# Patient Record
Sex: Female | Born: 1960 | Race: White | Hispanic: No | Smoking: Former smoker
Health system: Southern US, Community
[De-identification: ages and names within clinical notes are randomized; demographics above are authoritative.]

## PROBLEM LIST (undated history)

## (undated) DIAGNOSIS — J449 Chronic obstructive pulmonary disease, unspecified: Secondary | ICD-10-CM

## (undated) DIAGNOSIS — I219 Acute myocardial infarction, unspecified: Secondary | ICD-10-CM

## (undated) DIAGNOSIS — F32A Depression, unspecified: Secondary | ICD-10-CM

## (undated) DIAGNOSIS — I10 Essential (primary) hypertension: Secondary | ICD-10-CM

## (undated) DIAGNOSIS — I2510 Atherosclerotic heart disease of native coronary artery without angina pectoris: Secondary | ICD-10-CM

## (undated) DIAGNOSIS — G4730 Sleep apnea, unspecified: Secondary | ICD-10-CM

## (undated) DIAGNOSIS — K219 Gastro-esophageal reflux disease without esophagitis: Secondary | ICD-10-CM

## (undated) DIAGNOSIS — F329 Major depressive disorder, single episode, unspecified: Secondary | ICD-10-CM

## (undated) DIAGNOSIS — F419 Anxiety disorder, unspecified: Secondary | ICD-10-CM

## (undated) DIAGNOSIS — J40 Bronchitis, not specified as acute or chronic: Secondary | ICD-10-CM

## (undated) DIAGNOSIS — Z9889 Other specified postprocedural states: Secondary | ICD-10-CM

## (undated) DIAGNOSIS — B3781 Candidal esophagitis: Secondary | ICD-10-CM

## (undated) DIAGNOSIS — B977 Papillomavirus as the cause of diseases classified elsewhere: Secondary | ICD-10-CM

## (undated) DIAGNOSIS — N189 Chronic kidney disease, unspecified: Secondary | ICD-10-CM

## (undated) HISTORY — PX: BREAST BIOPSY: SHX20

## (undated) HISTORY — DX: Chronic kidney disease, unspecified: N18.9

## (undated) HISTORY — DX: Other specified postprocedural states: Z98.890

## (undated) HISTORY — PX: HEMORRHOID SURGERY: SHX153

## (undated) HISTORY — PX: CHOLECYSTECTOMY: SHX55

## (undated) HISTORY — DX: Sleep apnea, unspecified: G47.30

## (undated) HISTORY — PX: OTHER SURGICAL HISTORY: SHX169

## (undated) HISTORY — DX: Papillomavirus as the cause of diseases classified elsewhere: B97.7

## (undated) HISTORY — PX: TUBAL LIGATION: SHX77

## (undated) HISTORY — PX: HERNIA REPAIR: SHX51

## (undated) HISTORY — PX: CAROTID STENT: SHX1301

---

## 1988-04-21 HISTORY — PX: TUBAL LIGATION: SHX77

## 1988-04-21 HISTORY — PX: HEMORRHOID SURGERY: SHX153

## 2003-06-13 ENCOUNTER — Emergency Department (HOSPITAL_COMMUNITY): Admission: EM | Admit: 2003-06-13 | Discharge: 2003-06-14 | Payer: Self-pay | Admitting: *Deleted

## 2004-02-14 ENCOUNTER — Ambulatory Visit: Payer: Self-pay

## 2004-04-21 HISTORY — PX: CHOLECYSTECTOMY: SHX55

## 2004-06-04 ENCOUNTER — Other Ambulatory Visit: Payer: Self-pay

## 2004-06-04 ENCOUNTER — Emergency Department: Payer: Self-pay | Admitting: Emergency Medicine

## 2004-06-24 ENCOUNTER — Ambulatory Visit: Payer: Self-pay

## 2004-07-04 ENCOUNTER — Ambulatory Visit: Payer: Self-pay | Admitting: Nurse Practitioner

## 2006-05-06 ENCOUNTER — Inpatient Hospital Stay: Payer: Self-pay | Admitting: Psychiatry

## 2006-05-06 ENCOUNTER — Other Ambulatory Visit: Payer: Self-pay

## 2006-10-05 ENCOUNTER — Emergency Department: Payer: Self-pay

## 2008-02-29 ENCOUNTER — Emergency Department (HOSPITAL_COMMUNITY): Admission: EM | Admit: 2008-02-29 | Discharge: 2008-02-29 | Payer: Self-pay | Admitting: Emergency Medicine

## 2008-03-01 ENCOUNTER — Emergency Department (HOSPITAL_COMMUNITY): Admission: EM | Admit: 2008-03-01 | Discharge: 2008-03-01 | Payer: Self-pay | Admitting: Emergency Medicine

## 2008-06-21 ENCOUNTER — Emergency Department (HOSPITAL_COMMUNITY): Admission: EM | Admit: 2008-06-21 | Discharge: 2008-06-21 | Payer: Self-pay | Admitting: Emergency Medicine

## 2009-07-08 ENCOUNTER — Observation Stay (HOSPITAL_COMMUNITY): Admission: EM | Admit: 2009-07-08 | Discharge: 2009-07-09 | Payer: Self-pay | Admitting: Emergency Medicine

## 2009-07-09 ENCOUNTER — Ambulatory Visit: Payer: Self-pay | Admitting: Vascular Surgery

## 2009-07-09 ENCOUNTER — Encounter (INDEPENDENT_AMBULATORY_CARE_PROVIDER_SITE_OTHER): Payer: Self-pay | Admitting: Internal Medicine

## 2009-11-06 ENCOUNTER — Emergency Department (HOSPITAL_COMMUNITY): Admission: EM | Admit: 2009-11-06 | Discharge: 2009-11-06 | Payer: Self-pay | Admitting: Emergency Medicine

## 2010-01-22 DIAGNOSIS — I10 Essential (primary) hypertension: Secondary | ICD-10-CM | POA: Insufficient documentation

## 2010-02-06 ENCOUNTER — Encounter: Payer: Self-pay | Admitting: Pulmonary Disease

## 2010-02-06 DIAGNOSIS — R0602 Shortness of breath: Secondary | ICD-10-CM | POA: Insufficient documentation

## 2010-02-07 ENCOUNTER — Telehealth: Payer: Self-pay | Admitting: Pulmonary Disease

## 2010-05-21 NOTE — Miscellaneous (Signed)
Summary: Orders Update  Clinical Lists Changes  Problems: Added new problem of SHORTNESS OF BREATH (ICD-786.05) Orders: Added new Service order of No Show NS50 (NS50) - Signed

## 2010-05-21 NOTE — Progress Notes (Signed)
Summary: nos appt  Phone Note Call from Patient   Caller: [email or web address]  Call For: clance Summary of Call: LMTCB x2 to rsc nos from 10/19. Initial call taken by: Darletta Moll,  February 07, 2010 3:32 PM

## 2010-06-02 ENCOUNTER — Emergency Department (HOSPITAL_COMMUNITY): Payer: BC Managed Care – PPO

## 2010-06-02 ENCOUNTER — Emergency Department (HOSPITAL_COMMUNITY)
Admission: EM | Admit: 2010-06-02 | Discharge: 2010-06-02 | Disposition: A | Discharge status: Home / Self Care | Payer: BC Managed Care – PPO | Attending: Emergency Medicine | Admitting: Emergency Medicine

## 2010-06-02 DIAGNOSIS — R079 Chest pain, unspecified: Secondary | ICD-10-CM | POA: Insufficient documentation

## 2010-06-02 DIAGNOSIS — R0602 Shortness of breath: Secondary | ICD-10-CM | POA: Insufficient documentation

## 2010-06-02 DIAGNOSIS — J069 Acute upper respiratory infection, unspecified: Secondary | ICD-10-CM | POA: Insufficient documentation

## 2010-06-02 DIAGNOSIS — R062 Wheezing: Secondary | ICD-10-CM | POA: Insufficient documentation

## 2010-06-02 DIAGNOSIS — J40 Bronchitis, not specified as acute or chronic: Secondary | ICD-10-CM | POA: Insufficient documentation

## 2010-06-02 DIAGNOSIS — I10 Essential (primary) hypertension: Secondary | ICD-10-CM | POA: Insufficient documentation

## 2010-06-02 DIAGNOSIS — R42 Dizziness and giddiness: Secondary | ICD-10-CM | POA: Insufficient documentation

## 2010-06-02 LAB — BASIC METABOLIC PANEL
BUN: 12 mg/dL (ref 6–23)
CO2: 23 mEq/L (ref 19–32)
Chloride: 101 mEq/L (ref 96–112)
Creatinine, Ser: 1.31 mg/dL — ABNORMAL HIGH (ref 0.4–1.2)
GFR calc Af Amer: 52 mL/min — ABNORMAL LOW (ref >60)
Glucose, Bld: 99 mg/dL (ref 70–99)
Potassium: 3.6 mEq/L (ref 3.5–5.1)
Sodium: 136 mEq/L (ref 135–145)

## 2010-06-02 LAB — POCT CARDIAC MARKERS
CKMB, poc: <1 ng/mL — ABNORMAL LOW (ref 1.0–8.0)
Myoglobin, poc: 50.3 ng/mL (ref 12–200)
Troponin i, poc: <0.05 ng/mL (ref 0.00–0.09)
Troponin i, poc: <0.05 ng/mL (ref 0.00–0.09)

## 2010-06-02 LAB — CBC
HCT: 39.2 % (ref 36.0–46.0)
Hemoglobin: 13.6 g/dL (ref 12.0–15.0)
MCH: 31.6 pg (ref 26.0–34.0)
MCHC: 34.7 g/dL (ref 30.0–36.0)
Platelets: 243 10*3/uL (ref 150–400)

## 2010-06-02 LAB — DIFFERENTIAL
Lymphocytes Relative: 21 % (ref 12–46)
Neutro Abs: 4.5 10*3/uL (ref 1.7–7.7)
Neutrophils Relative %: 61 % (ref 43–77)

## 2010-07-06 LAB — CBC
MCHC: 33.9 g/dL (ref 30.0–36.0)
RDW: 14.1 % (ref 11.5–15.5)

## 2010-07-06 LAB — URINALYSIS, ROUTINE W REFLEX MICROSCOPIC
Bilirubin Urine: NEGATIVE
Ketones, ur: NEGATIVE mg/dL
Nitrite: NEGATIVE
Protein, ur: NEGATIVE mg/dL
Specific Gravity, Urine: 1.01 (ref 1.005–1.030)
Urobilinogen, UA: 0.2 mg/dL (ref 0.0–1.0)

## 2010-07-06 LAB — POCT CARDIAC MARKERS
CKMB, poc: 1.1 ng/mL (ref 1.0–8.0)
Myoglobin, poc: 68.4 ng/mL (ref 12–200)

## 2010-07-06 LAB — BASIC METABOLIC PANEL
BUN: 13 mg/dL (ref 6–23)
Calcium: 9.5 mg/dL (ref 8.4–10.5)
Creatinine, Ser: 1.21 mg/dL — ABNORMAL HIGH (ref 0.4–1.2)
GFR calc non Af Amer: 47 mL/min — ABNORMAL LOW (ref >60)
Glucose, Bld: 110 mg/dL — ABNORMAL HIGH (ref 70–99)

## 2010-07-06 LAB — PREGNANCY, URINE: Preg Test, Ur: NEGATIVE

## 2010-07-06 LAB — DIFFERENTIAL
Basophils Absolute: 0.1 10*3/uL (ref 0.0–0.1)
Basophils Relative: 1 % (ref 0–1)
Monocytes Absolute: 0.3 10*3/uL (ref 0.1–1.0)
Neutro Abs: 10 10*3/uL — ABNORMAL HIGH (ref 1.7–7.7)
Neutrophils Relative %: 84 % — ABNORMAL HIGH (ref 43–77)

## 2010-07-15 LAB — DIFFERENTIAL
Basophils Absolute: 0.1 10*3/uL (ref 0.0–0.1)
Basophils Relative: 1 % (ref 0–1)
Eosinophils Absolute: 0.4 10*3/uL (ref 0.0–0.7)
Eosinophils Relative: 4 % (ref 0–5)
Monocytes Absolute: 0.6 10*3/uL (ref 0.1–1.0)
Monocytes Relative: 6 % (ref 3–12)
Neutro Abs: 6.6 10*3/uL (ref 1.7–7.7)

## 2010-07-15 LAB — BLOOD GAS, ARTERIAL
Bicarbonate: 17.3 mEq/L — ABNORMAL LOW (ref 20.0–24.0)
Drawn by: 270091
O2 Content: 2 L/min
pCO2 arterial: 20.1 mmHg — ABNORMAL LOW (ref 35.0–45.0)
pO2, Arterial: 129 mmHg — ABNORMAL HIGH (ref 80.0–100.0)

## 2010-07-15 LAB — GLUCOSE, CAPILLARY
Glucose-Capillary: 107 mg/dL — ABNORMAL HIGH (ref 70–99)
Glucose-Capillary: 144 mg/dL — ABNORMAL HIGH (ref 70–99)

## 2010-07-15 LAB — LIPASE, BLOOD
Lipase: 25 U/L (ref 11–59)
Lipase: 62 U/L — ABNORMAL HIGH (ref 11–59)

## 2010-07-15 LAB — CBC
HCT: 38 % (ref 36.0–46.0)
HCT: 40 % (ref 36.0–46.0)
Hemoglobin: 12.5 g/dL (ref 12.0–15.0)
Hemoglobin: 14 g/dL (ref 12.0–15.0)
MCHC: 34.9 g/dL (ref 30.0–36.0)
MCV: 98.8 fL (ref 78.0–100.0)
MCV: 98.9 fL (ref 78.0–100.0)
Platelets: 268 10*3/uL (ref 150–400)
RBC: 3.62 MIL/uL — ABNORMAL LOW (ref 3.87–5.11)
RDW: 14.6 % (ref 11.5–15.5)
RDW: 14.7 % (ref 11.5–15.5)
WBC: 8.2 10*3/uL (ref 4.0–10.5)
WBC: 8.8 10*3/uL (ref 4.0–10.5)

## 2010-07-15 LAB — POCT I-STAT, CHEM 8
Calcium, Ion: 1.09 mmol/L — ABNORMAL LOW (ref 1.12–1.32)
Glucose, Bld: 100 mg/dL — ABNORMAL HIGH (ref 70–99)
HCT: 41 % (ref 36.0–46.0)
Hemoglobin: 13.9 g/dL (ref 12.0–15.0)
TCO2: 23 mmol/L (ref 0–100)

## 2010-07-15 LAB — BASIC METABOLIC PANEL
BUN: 3 mg/dL — ABNORMAL LOW (ref 6–23)
Chloride: 105 mEq/L (ref 96–112)
Creatinine, Ser: 0.93 mg/dL (ref 0.4–1.2)
GFR calc non Af Amer: >60 mL/min (ref >60)
Glucose, Bld: 94 mg/dL (ref 70–99)
Potassium: 4.2 mEq/L (ref 3.5–5.1)

## 2010-07-15 LAB — RAPID URINE DRUG SCREEN, HOSP PERFORMED
Barbiturates: NOT DETECTED
Cocaine: NOT DETECTED
Opiates: POSITIVE — AB
Tetrahydrocannabinol: NOT DETECTED

## 2010-07-15 LAB — COMPREHENSIVE METABOLIC PANEL
ALT: 27 U/L (ref 0–35)
Alkaline Phosphatase: 78 U/L (ref 39–117)
CO2: 27 mEq/L (ref 19–32)
GFR calc non Af Amer: >60 mL/min (ref >60)
Glucose, Bld: 94 mg/dL (ref 70–99)
Potassium: 4.1 mEq/L (ref 3.5–5.1)
Sodium: 142 mEq/L (ref 135–145)

## 2010-07-15 LAB — CK TOTAL AND CKMB (NOT AT ARMC)
Relative Index: INVALID (ref 0.0–2.5)
Relative Index: INVALID (ref 0.0–2.5)
Total CK: 67 U/L (ref 7–177)

## 2010-07-15 LAB — TROPONIN I
Troponin I: 0.01 ng/mL (ref 0.00–0.06)
Troponin I: <0.01 ng/mL (ref 0.00–0.06)

## 2010-07-15 LAB — URINALYSIS, ROUTINE W REFLEX MICROSCOPIC
Bilirubin Urine: NEGATIVE
Glucose, UA: NEGATIVE mg/dL
Specific Gravity, Urine: 1.005 (ref 1.005–1.030)
Urobilinogen, UA: 0.2 mg/dL (ref 0.0–1.0)

## 2010-07-15 LAB — LIPID PANEL
HDL: 45 mg/dL (ref >39)
VLDL: 22 mg/dL (ref 0–40)

## 2010-07-15 LAB — TSH: TSH: 2.863 u[IU]/mL (ref 0.350–4.500)

## 2010-07-15 LAB — URINE MICROSCOPIC-ADD ON

## 2010-07-15 LAB — POCT PREGNANCY, URINE: Preg Test, Ur: NEGATIVE

## 2010-08-01 LAB — COMPREHENSIVE METABOLIC PANEL
ALT: 9 U/L (ref 0–35)
AST: 16 U/L (ref 0–37)
Albumin: 3.4 g/dL — ABNORMAL LOW (ref 3.5–5.2)
Alkaline Phosphatase: 56 U/L (ref 39–117)
BUN: 11 mg/dL (ref 6–23)
Chloride: 105 mEq/L (ref 96–112)
Potassium: 3.1 mEq/L — ABNORMAL LOW (ref 3.5–5.1)
Sodium: 137 mEq/L (ref 135–145)
Total Bilirubin: 0.5 mg/dL (ref 0.3–1.2)

## 2010-08-01 LAB — URINALYSIS, ROUTINE W REFLEX MICROSCOPIC
Bilirubin Urine: NEGATIVE
Glucose, UA: NEGATIVE mg/dL
Hgb urine dipstick: NEGATIVE
Ketones, ur: NEGATIVE mg/dL
Protein, ur: NEGATIVE mg/dL
Urobilinogen, UA: 0.2 mg/dL (ref 0.0–1.0)

## 2010-08-01 LAB — DIFFERENTIAL
Basophils Absolute: 0 10*3/uL (ref 0.0–0.1)
Basophils Relative: 0 % (ref 0–1)
Eosinophils Absolute: 0.2 10*3/uL (ref 0.0–0.7)
Eosinophils Relative: 3 % (ref 0–5)
Monocytes Absolute: 0.4 10*3/uL (ref 0.1–1.0)
Neutro Abs: 5.3 10*3/uL (ref 1.7–7.7)

## 2010-08-01 LAB — RAPID URINE DRUG SCREEN, HOSP PERFORMED
Amphetamines: NOT DETECTED
Benzodiazepines: POSITIVE — AB
Tetrahydrocannabinol: NOT DETECTED

## 2010-08-01 LAB — CBC
HCT: 39.3 % (ref 36.0–46.0)
Platelets: 267 10*3/uL (ref 150–400)
WBC: 8.2 10*3/uL (ref 4.0–10.5)

## 2010-08-01 LAB — ETHANOL: Alcohol, Ethyl (B): <5 mg/dL (ref 0–10)

## 2011-01-18 ENCOUNTER — Inpatient Hospital Stay (HOSPITAL_COMMUNITY)
Admission: EM | Admit: 2011-01-18 | Discharge: 2011-01-23 | DRG: 182 | Disposition: A | Discharge status: Home / Self Care | Payer: BC Managed Care – PPO | Attending: Internal Medicine | Admitting: Internal Medicine

## 2011-01-18 ENCOUNTER — Emergency Department (HOSPITAL_COMMUNITY): Payer: BC Managed Care – PPO

## 2011-01-18 ENCOUNTER — Other Ambulatory Visit: Payer: Self-pay

## 2011-01-18 ENCOUNTER — Encounter: Payer: Self-pay | Admitting: *Deleted

## 2011-01-18 DIAGNOSIS — R0609 Other forms of dyspnea: Secondary | ICD-10-CM | POA: Diagnosis present

## 2011-01-18 DIAGNOSIS — E871 Hypo-osmolality and hyponatremia: Secondary | ICD-10-CM | POA: Diagnosis not present

## 2011-01-18 DIAGNOSIS — K859 Acute pancreatitis without necrosis or infection, unspecified: Secondary | ICD-10-CM | POA: Diagnosis present

## 2011-01-18 DIAGNOSIS — D130 Benign neoplasm of esophagus: Secondary | ICD-10-CM | POA: Diagnosis present

## 2011-01-18 DIAGNOSIS — K2960 Other gastritis without bleeding: Principal | ICD-10-CM | POA: Diagnosis present

## 2011-01-18 DIAGNOSIS — R112 Nausea with vomiting, unspecified: Secondary | ICD-10-CM

## 2011-01-18 DIAGNOSIS — F341 Dysthymic disorder: Secondary | ICD-10-CM | POA: Diagnosis present

## 2011-01-18 DIAGNOSIS — R0600 Dyspnea, unspecified: Secondary | ICD-10-CM

## 2011-01-18 DIAGNOSIS — T380X5A Adverse effect of glucocorticoids and synthetic analogues, initial encounter: Secondary | ICD-10-CM | POA: Diagnosis present

## 2011-01-18 DIAGNOSIS — E873 Alkalosis: Secondary | ICD-10-CM | POA: Diagnosis present

## 2011-01-18 DIAGNOSIS — J449 Chronic obstructive pulmonary disease, unspecified: Secondary | ICD-10-CM | POA: Diagnosis present

## 2011-01-18 DIAGNOSIS — R0602 Shortness of breath: Secondary | ICD-10-CM

## 2011-01-18 DIAGNOSIS — R110 Nausea: Secondary | ICD-10-CM | POA: Diagnosis present

## 2011-01-18 DIAGNOSIS — F329 Major depressive disorder, single episode, unspecified: Secondary | ICD-10-CM | POA: Diagnosis present

## 2011-01-18 DIAGNOSIS — R079 Chest pain, unspecified: Secondary | ICD-10-CM | POA: Diagnosis present

## 2011-01-18 DIAGNOSIS — F419 Anxiety disorder, unspecified: Secondary | ICD-10-CM | POA: Diagnosis present

## 2011-01-18 DIAGNOSIS — F32A Depression, unspecified: Secondary | ICD-10-CM | POA: Diagnosis present

## 2011-01-18 DIAGNOSIS — I10 Essential (primary) hypertension: Secondary | ICD-10-CM

## 2011-01-18 DIAGNOSIS — R1310 Dysphagia, unspecified: Secondary | ICD-10-CM | POA: Diagnosis not present

## 2011-01-18 DIAGNOSIS — R1013 Epigastric pain: Secondary | ICD-10-CM | POA: Diagnosis present

## 2011-01-18 DIAGNOSIS — E876 Hypokalemia: Secondary | ICD-10-CM | POA: Diagnosis not present

## 2011-01-18 DIAGNOSIS — K3189 Other diseases of stomach and duodenum: Secondary | ICD-10-CM | POA: Diagnosis present

## 2011-01-18 DIAGNOSIS — J4489 Other specified chronic obstructive pulmonary disease: Secondary | ICD-10-CM | POA: Diagnosis present

## 2011-01-18 HISTORY — DX: Depression, unspecified: F32.A

## 2011-01-18 HISTORY — DX: Candidal esophagitis: B37.81

## 2011-01-18 HISTORY — DX: Gastro-esophageal reflux disease without esophagitis: K21.9

## 2011-01-18 HISTORY — DX: Chronic obstructive pulmonary disease, unspecified: J44.9

## 2011-01-18 HISTORY — DX: Atherosclerotic heart disease of native coronary artery without angina pectoris: I25.10

## 2011-01-18 HISTORY — DX: Essential (primary) hypertension: I10

## 2011-01-18 HISTORY — DX: Bronchitis, not specified as acute or chronic: J40

## 2011-01-18 HISTORY — DX: Anxiety disorder, unspecified: F41.9

## 2011-01-18 HISTORY — DX: Major depressive disorder, single episode, unspecified: F32.9

## 2011-01-18 LAB — CARDIAC PANEL(CRET KIN+CKTOT+MB+TROPI)
CK, MB: 2.6 ng/mL (ref 0.3–4.0)
Relative Index: INVALID (ref 0.0–2.5)
Total CK: 45 U/L (ref 7–177)
Troponin I: <0.3 ng/mL

## 2011-01-18 LAB — HEPATIC FUNCTION PANEL
ALT: 11 U/L (ref 0–35)
AST: 13 U/L (ref 0–37)
Albumin: 3.9 g/dL (ref 3.5–5.2)
Alkaline Phosphatase: 85 U/L (ref 39–117)
Bilirubin, Direct: 0.1 mg/dL (ref 0.0–0.3)
Indirect Bilirubin: 0.2 mg/dL — ABNORMAL LOW (ref 0.3–0.9)
Total Bilirubin: 0.3 mg/dL (ref 0.3–1.2)
Total Protein: 7.3 g/dL (ref 6.0–8.3)

## 2011-01-18 LAB — BASIC METABOLIC PANEL
Chloride: 96 mEq/L (ref 96–112)
GFR calc Af Amer: 58 mL/min — ABNORMAL LOW (ref >60)
GFR calc non Af Amer: 48 mL/min — ABNORMAL LOW (ref >60)
Potassium: 3.5 mEq/L (ref 3.5–5.1)

## 2011-01-18 LAB — DIFFERENTIAL
Eosinophils Absolute: 0.1 10*3/uL (ref 0.0–0.7)
Eosinophils Relative: 1 % (ref 0–5)
Lymphs Abs: 1.5 10*3/uL (ref 0.7–4.0)
Monocytes Relative: 4 % (ref 3–12)

## 2011-01-18 LAB — CBC
HCT: 42.3 % (ref 36.0–46.0)
Hemoglobin: 14.3 g/dL (ref 12.0–15.0)
MCH: 29.9 pg (ref 26.0–34.0)
MCV: 88.5 fL (ref 78.0–100.0)
Platelets: 316 10*3/uL (ref 150–400)
WBC: 14 10*3/uL — ABNORMAL HIGH (ref 4.0–10.5)

## 2011-01-18 LAB — BLOOD GAS, ARTERIAL
Acid-base deficit: 2.8 mmol/L — ABNORMAL HIGH (ref 0.0–2.0)
Bicarbonate: 17.9 meq/L — ABNORMAL LOW (ref 20.0–24.0)
O2 Content: 21 L/min
O2 Saturation: 99.1 %
Patient temperature: 37
TCO2: 14.9 mmol/L (ref 0–100)
pCO2 arterial: 14.9 mmHg — CL (ref 35.0–45.0)
pH, Arterial: 7.681 (ref 7.350–7.400)
pO2, Arterial: 122 mmHg — ABNORMAL HIGH (ref 80.0–100.0)

## 2011-01-18 LAB — PRO B NATRIURETIC PEPTIDE: Pro B Natriuretic peptide (BNP): 47.2 pg/mL (ref 0–125)

## 2011-01-18 LAB — LIPASE, BLOOD: Lipase: 71 U/L — ABNORMAL HIGH (ref 11–59)

## 2011-01-18 MED ORDER — BUPROPION HCL ER (XL) 150 MG PO TB24
ORAL_TABLET | ORAL | Status: AC
  Filled 2011-01-18: qty 1

## 2011-01-18 MED ORDER — SODIUM CHLORIDE 0.9 % IV BOLUS (SEPSIS)
1000.0000 mL | Freq: Once | INTRAVENOUS | Status: AC
  Administered 2011-01-18: 1000 mL via INTRAVENOUS

## 2011-01-18 MED ORDER — ONDANSETRON HCL 4 MG/2ML IJ SOLN
4.0000 mg | Freq: Four times a day (QID) | INTRAMUSCULAR | Status: DC | PRN
  Administered 2011-01-19 – 2011-01-23 (×10): 4 mg via INTRAVENOUS
  Filled 2011-01-18 (×10): qty 2

## 2011-01-18 MED ORDER — MORPHINE SULFATE 2 MG/ML IJ SOLN
2.0000 mg | INTRAMUSCULAR | Status: DC | PRN
  Administered 2011-01-19 – 2011-01-23 (×13): 2 mg via INTRAVENOUS
  Filled 2011-01-18 (×15): qty 1

## 2011-01-18 MED ORDER — PANTOPRAZOLE SODIUM 40 MG IV SOLR
40.0000 mg | Freq: Once | INTRAVENOUS | Status: AC
  Administered 2011-01-18: 40 mg via INTRAVENOUS
  Filled 2011-01-18: qty 40

## 2011-01-18 MED ORDER — HEPARIN SODIUM (PORCINE) 5000 UNIT/ML IJ SOLN
5000.0000 [IU] | Freq: Three times a day (TID) | INTRAMUSCULAR | Status: DC
  Administered 2011-01-18 – 2011-01-23 (×12): 5000 [IU] via SUBCUTANEOUS
  Filled 2011-01-18 (×13): qty 1

## 2011-01-18 MED ORDER — ONDANSETRON HCL 4 MG PO TABS: 4.0000 mg | ORAL_TABLET | Freq: Four times a day (QID) | ORAL | Status: DC | PRN

## 2011-01-18 MED ORDER — PROMETHAZINE HCL 25 MG/ML IJ SOLN
12.5000 mg | Freq: Four times a day (QID) | INTRAMUSCULAR | Status: DC | PRN
  Administered 2011-01-21 – 2011-01-23 (×2): 12.5 mg via INTRAVENOUS
  Filled 2011-01-18 (×2): qty 1

## 2011-01-18 MED ORDER — DEXTROSE-NACL 5-0.45 % IV SOLN
INTRAVENOUS | Status: DC
  Administered 2011-01-18 – 2011-01-19 (×2): via INTRAVENOUS

## 2011-01-18 MED ORDER — METHYLPREDNISOLONE SODIUM SUCC 125 MG IJ SOLR
60.0000 mg | Freq: Three times a day (TID) | INTRAMUSCULAR | Status: DC
  Administered 2011-01-18 – 2011-01-20 (×5): 60 mg via INTRAVENOUS
  Filled 2011-01-18 (×5): qty 2

## 2011-01-18 MED ORDER — ALBUTEROL SULFATE (5 MG/ML) 0.5% IN NEBU: 5.0000 mg | INHALATION_SOLUTION | Freq: Once | RESPIRATORY_TRACT | Status: DC

## 2011-01-18 MED ORDER — ONDANSETRON HCL 4 MG/2ML IJ SOLN
4.0000 mg | Freq: Once | INTRAMUSCULAR | Status: AC
  Administered 2011-01-18: 4 mg via INTRAVENOUS
  Filled 2011-01-18: qty 2

## 2011-01-18 MED ORDER — ASPIRIN EC 81 MG PO TBEC
81.0000 mg | DELAYED_RELEASE_TABLET | Freq: Every day | ORAL | Status: DC
  Administered 2011-01-19: 81 mg via ORAL
  Filled 2011-01-18: qty 1

## 2011-01-18 MED ORDER — SODIUM CHLORIDE 0.9 % IN NEBU
INHALATION_SOLUTION | RESPIRATORY_TRACT | Status: AC
  Administered 2011-01-18: 3 mL
  Filled 2011-01-18: qty 3

## 2011-01-18 MED ORDER — ALBUTEROL SULFATE HFA 108 (90 BASE) MCG/ACT IN AERS: 2.0000 | INHALATION_SPRAY | Freq: Four times a day (QID) | RESPIRATORY_TRACT | Status: DC | PRN

## 2011-01-18 MED ORDER — LORAZEPAM 2 MG/ML IJ SOLN
2.0000 mg | Freq: Once | INTRAMUSCULAR | Status: AC
  Administered 2011-01-18: 2 mg via INTRAVENOUS
  Filled 2011-01-18: qty 1

## 2011-01-18 MED ORDER — OXYCODONE HCL 5 MG PO TABS
5.0000 mg | ORAL_TABLET | Freq: Four times a day (QID) | ORAL | Status: DC | PRN
  Administered 2011-01-19: 5 mg via ORAL
  Filled 2011-01-18: qty 1

## 2011-01-18 MED ORDER — ALBUTEROL SULFATE (5 MG/ML) 0.5% IN NEBU
5.0000 mg | INHALATION_SOLUTION | Freq: Once | RESPIRATORY_TRACT | Status: AC
  Administered 2011-01-18: 5 mg via RESPIRATORY_TRACT
  Filled 2011-01-18: qty 1

## 2011-01-18 MED ORDER — PANTOPRAZOLE SODIUM 40 MG PO TBEC
40.0000 mg | DELAYED_RELEASE_TABLET | Freq: Two times a day (BID) | ORAL | Status: DC
  Administered 2011-01-19: 40 mg via ORAL
  Filled 2011-01-18: qty 1

## 2011-01-18 MED ORDER — FLUTICASONE-SALMETEROL 115-21 MCG/ACT IN AERO
2.0000 | INHALATION_SPRAY | Freq: Two times a day (BID) | RESPIRATORY_TRACT | Status: DC
  Filled 2011-01-18: qty 1

## 2011-01-18 MED ORDER — BUPROPION HCL ER (SR) 150 MG PO TB12
150.0000 mg | ORAL_TABLET | Freq: Three times a day (TID) | ORAL | Status: DC
  Administered 2011-01-18 – 2011-01-23 (×14): 150 mg via ORAL
  Filled 2011-01-18 (×17): qty 1

## 2011-01-18 MED ORDER — LORAZEPAM 1 MG PO TABS
1.0000 mg | ORAL_TABLET | Freq: Once | ORAL | Status: AC
  Administered 2011-01-18: 1 mg via ORAL
  Filled 2011-01-18: qty 1

## 2011-01-18 MED ORDER — HYDROCORTISONE 2.5 % RE CREA
TOPICAL_CREAM | RECTAL | Status: AC
  Filled 2011-01-18: qty 28.35

## 2011-01-18 MED ORDER — PROMETHAZINE HCL 12.5 MG PO TABS
12.5000 mg | ORAL_TABLET | Freq: Four times a day (QID) | ORAL | Status: DC | PRN
  Administered 2011-01-19: 12.5 mg via ORAL
  Filled 2011-01-18: qty 1

## 2011-01-18 MED ORDER — HYDROCORTISONE 2.5 % RE CREA
1.0000 "application " | TOPICAL_CREAM | Freq: Two times a day (BID) | RECTAL | Status: DC
  Administered 2011-01-20 – 2011-01-21 (×3): 1 via RECTAL
  Filled 2011-01-18: qty 28.35

## 2011-01-18 MED ORDER — LORAZEPAM 1 MG PO TABS
1.0000 mg | ORAL_TABLET | Freq: Three times a day (TID) | ORAL | Status: DC | PRN
  Administered 2011-01-18 – 2011-01-20 (×5): 1 mg via ORAL
  Filled 2011-01-18 (×5): qty 1

## 2011-01-18 MED ORDER — ALBUTEROL SULFATE (5 MG/ML) 0.5% IN NEBU
2.5000 mg | INHALATION_SOLUTION | RESPIRATORY_TRACT | Status: DC | PRN
  Administered 2011-01-18 – 2011-01-23 (×5): 2.5 mg via RESPIRATORY_TRACT
  Filled 2011-01-18 (×5): qty 0.5

## 2011-01-18 MED ORDER — ONDANSETRON 8 MG PO TBDP
8.0000 mg | ORAL_TABLET | Freq: Once | ORAL | Status: AC
  Administered 2011-01-18: 8 mg via ORAL
  Filled 2011-01-18: qty 1

## 2011-01-18 MED ORDER — ACETAMINOPHEN 325 MG PO TABS
650.0000 mg | ORAL_TABLET | Freq: Four times a day (QID) | ORAL | Status: DC | PRN
  Administered 2011-01-19: 650 mg via ORAL
  Filled 2011-01-18: qty 2

## 2011-01-18 MED ORDER — ACETAMINOPHEN 650 MG RE SUPP: 650.0000 mg | Freq: Four times a day (QID) | RECTAL | Status: DC | PRN

## 2011-01-18 NOTE — ED Notes (Signed)
Dr Effie Shy in to speak with pt at this time.

## 2011-01-18 NOTE — ED Notes (Signed)
Attempted to call report to [facility] and she not able to take report at this time, will call back once able

## 2011-01-18 NOTE — ED Notes (Signed)
Advised Dr Bebe Shaggy pt has no wheezing at this time. vo to hold breathing tx. Nad.

## 2011-01-18 NOTE — H&P (Signed)
[name] is an 50 y.o. female.  Her primary care physician is [employer] research officer, government  Chief Complaint: Shortness of breath, abdominal pain, and nausea, vomiting  HPI: This is a 50 year old, Caucasian female, with a history of COPD, hypertension, who tells me that she was in her usual state of health about 3 weeks ago, when she started with a sinus infection. She went to her primary care provider, and was prescribed Z-Pak. Patient did not feel any better and she went back after few days and was diagnosed with bronchitis. She was given another prescription for Z-Pak. And, then 10 days ago, she went for a recheck and felt that she was worse and so, she was prescribed Avelox for 10 days. She finished that course today. She was also started on prednisone yesterday.  She tells me that the shortness of breath is with rest. When she talks she gets extremely short of breath. She feels very tired and exhausted. She admits to some leg swelling once in a while. She's been having abdominal pains for a week ago, and got worse on Friday. This has been associated with nausea, vomiting. The pain started in the upper abdomen in the epigastric area. It's about 8/10 in intensity. No radiation of the pain. She feels like she is bloated. Denies any blood in the emesis. Denies any black colored stools. Her last bowel movement was 2 days ago, which was normal. She tells me she's been constipated for the past many months. She denies any weight loss. However, is concerned about the excessive weight gain of about 24 pounds in the last few months. She also has heartburn, and has been having a pressure-like sensation in her upper chest for the last few weeks. She's had a dry cough. Denies any history of heart disease. Because her symptoms not improving she decided to come in to the emergency department for further evaluation.    Prior to Admission medications   Medication Sig Start Date End Date Taking? Authorizing Provider   albuterol (PROVENTIL HFA;VENTOLIN HFA) 108 (90 BASE) MCG/ACT inhaler Inhale 2 puffs into the lungs every 6 (six) hours as needed. For asthma     Yes Historical Provider, MD  albuterol (PROVENTIL) (2.5 MG/3ML) 0.083% nebulizer solution Take 2.5 mg by nebulization every 6 (six) hours as needed. For asthma    Yes Historical Provider, MD  buPROPion (WELLBUTRIN SR) 150 MG 12 hr tablet Take 150 mg by mouth 3 (three) times daily.     Yes Historical Provider, MD  cetirizine (ZYRTEC) 10 MG tablet Take 10 mg by mouth daily as needed. For allergies    Yes Historical Provider, MD  esomeprazole (NEXIUM) 40 MG capsule Take 40 mg by mouth 2 (two) times daily.     Yes Historical Provider, MD  fluticasone (FLONASE) 50 MCG/ACT nasal spray Place 2 sprays into the nose daily.     Yes Historical Provider, MD  fluticasone-salmeterol (ADVAIR HFA) 115-21 MCG/ACT inhaler Inhale 1 puff into the lungs 2 (two) times daily.     Yes Historical Provider, MD  hydrocortisone (ANUSOL-HC) 2.5 % rectal cream Place 1 application rectally 2 (two) times daily. For hemorrhoids    Yes Historical Provider, MD  ipratropium (ATROVENT) 0.02 % nebulizer solution Take 500 mcg by nebulization 4 (four) times daily as needed. For asthma    Yes Historical Provider, MD  levalbuterol (XOPENEX) 0.63 MG/3ML nebulizer solution Take 1 ampule by nebulization every 4 (four) hours as needed. For asthma     Yes  Historical Provider, MD  levalbuterol Pauline Aus) 1.25 MG/3ML nebulizer solution Take 1 ampule by nebulization every 4 (four) hours as needed. For asthma    Yes Historical Provider, MD  LORazepam (ATIVAN) 1 MG tablet Take 1 mg by mouth every 8 (eight) hours.     Yes Historical Provider, MD  losartan (COZAAR) 100 MG tablet Take 100 mg by mouth daily.     Yes Historical Provider, MD  moxifloxacin (AVELOX) 400 MG tablet Take 400 mg by mouth daily. Last dose was today. Prescription was for 10 days    Yes Historical Provider, MD  Multiple Vitamins-Calcium  (ONE-A-DAY WOMENS PO) Take 1 tablet by mouth daily.     Yes Historical Provider, MD  ondansetron (ZOFRAN) 4 MG tablet Take 4 mg by mouth every 8 (eight) hours as needed. For nausea    Yes Historical Provider, MD  oxyCODONE (OXY IR/ROXICODONE) 5 MG immediate release tablet Take 5-10 mg by mouth every 6 (six) hours as needed. For pain    Yes Historical Provider, MD  PREDNISONE, PAK, PO Take 10-60 mg by mouth daily. Dose pack. Started 01/17/11. Took 60mg .   Yes Historical Provider, MD  triamterene-hydrochlorothiazide (MAXZIDE) 75-50 MG per tablet Take 1 tablet by mouth daily.     Yes Historical Provider, MD    Allergies:  Allergies  Allergen Reactions  . Pregabalin Other (See Comments)    'bad reaction' hallucinations and acting crazy after taking Lyrica  . Shellfish Allergy Hives    Past Medical History  Diagnosis Date  . COPD (chronic obstructive pulmonary disease)   . Bronchitis   . Candida infection, esophageal   . Hypertension   . Depression   . Anxiety   . GERD (gastroesophageal reflux disease)     Past Surgical History  Procedure Date  . Cholecystectomy   . Tubal ligation   . Hemorrhoid surgery     Social History:  reports that she has been smoking.  She does not have any smokeless tobacco history on file. She reports that she drinks alcohol. She reports that she does not use illicit drugs.  Family History:  Family History  Problem Relation Age of Onset  . Hypertension Mother   . Heart disease Father     Review of Systems  Constitutional: Positive for malaise/fatigue. Negative for fever, chills and weight loss.  HENT: Negative.   Eyes: Negative.   Respiratory: Positive for cough, shortness of breath and wheezing.   Cardiovascular: Positive for chest pain and leg swelling.  Gastrointestinal: Positive for heartburn, nausea, vomiting and abdominal pain. Negative for blood in stool.  Genitourinary: Negative.   Musculoskeletal: Positive for myalgias.  Skin: Negative.    Neurological: Positive for weakness. Negative for focal weakness.  Endo/Heme/Allergies: Negative.   Psychiatric/Behavioral: Positive for depression. Negative for suicidal ideas. The patient is nervous/anxious.      Blood pressure 117/58, pulse 100, temperature 98.2 F (36.8 C), temperature source Oral, resp. rate 23, last menstrual period 12/04/2010, SpO2 100.00%. Physical Exam  Vitals reviewed. Constitutional: She is oriented to person, place, and time. She appears well-developed and well-nourished. No distress.  HENT:  Head: Normocephalic and atraumatic.  Right Ear: External ear normal.  Mouth/Throat: No oropharyngeal exudate.  Eyes: EOM are normal. Pupils are equal, round, and reactive to light. Right eye exhibits no discharge. Left eye exhibits no discharge. No scleral icterus.  Neck: Normal range of motion. Neck supple. No JVD present. No tracheal deviation present. No thyromegaly present.  Cardiovascular: Normal rate, regular rhythm and  normal heart sounds.  Exam reveals no gallop and no friction rub.   No murmur heard. Pulmonary/Chest: Effort normal. No stridor. No respiratory distress. She has no wheezes. She has no rales. She exhibits no tenderness.  Abdominal: Soft. Bowel sounds are normal. She exhibits distension. She exhibits no shifting dullness, no pulsatile liver, no abdominal bruit, no ascites, no pulsatile midline mass and no mass. There is tenderness in the epigastric area and left upper quadrant. There is no rigidity, no rebound, no guarding and no CVA tenderness.  Lymphadenopathy:    She has no cervical adenopathy.  Neurological: She is alert and oriented to person, place, and time. No cranial nerve deficit. She exhibits normal muscle tone. Coordination normal.  Skin: Skin is warm and dry.  Psychiatric: Her speech is normal. Her mood appears anxious. Her affect is labile.     Results for orders placed during the hospital encounter of 01/18/11 (from the past 48  hour(s))  BASIC METABOLIC PANEL     Status: Abnormal   Collection Time   01/18/11  1:36 PM      Component Value Range Comment   Sodium 135  135 - 145 (mEq/L)    Potassium 3.5  3.5 - 5.1 (mEq/L)    Chloride 96  96 - 112 (mEq/L)    CO2 23  19 - 32 (mEq/L)    Glucose, Bld 98  70 - 99 (mg/dL)    BUN 16  6 - 23 (mg/dL)    Creatinine, Ser 1.61 (*) 0.50 - 1.10 (mg/dL)    Calcium 9.7  8.4 - 10.5 (mg/dL)    GFR calc non Af Amer 48 (*) >60 (mL/min)    GFR calc Af Amer 58 (*) >60 (mL/min)   CBC     Status: Abnormal   Collection Time   01/18/11  1:36 PM      Component Value Range Comment   WBC 14.0 (*) 4.0 - 10.5 (K/uL)    RBC 4.78  3.87 - 5.11 (MIL/uL)    Hemoglobin 14.3  12.0 - 15.0 (g/dL)    HCT 09.6  04.5 - 40.9 (%)    MCV 88.5  78.0 - 100.0 (fL)    MCH 29.9  26.0 - 34.0 (pg)    MCHC 33.8  30.0 - 36.0 (g/dL)    RDW 81.1  91.4 - 78.2 (%)    Platelets 316  150 - 400 (K/uL)   DIFFERENTIAL     Status: Abnormal   Collection Time   01/18/11  1:36 PM      Component Value Range Comment   Neutrophils Relative 84 (*) 43 - 77 (%)    Neutro Abs 11.8 (*) 1.7 - 7.7 (K/uL)    Lymphocytes Relative 11 (*) 12 - 46 (%)    Lymphs Abs 1.5  0.7 - 4.0 (K/uL)    Monocytes Relative 4  3 - 12 (%)    Monocytes Absolute 0.6  0.1 - 1.0 (K/uL)    Eosinophils Relative 1  0 - 5 (%)    Eosinophils Absolute 0.1  0.0 - 0.7 (K/uL)    Basophils Relative 0  0 - 1 (%)    Basophils Absolute 0.0  0.0 - 0.1 (K/uL)   D-DIMER, QUANTITATIVE     Status: Normal   Collection Time   01/18/11  1:36 PM      Component Value Range Comment   D-Dimer, Quant 0.22  0.00 - 0.48 (ug/mL-FEU)   CARDIAC PANEL(CRET KIN+CKTOT+MB+TROPI)     Status:  Normal   Collection Time   01/18/11  1:36 PM      Component Value Range Comment   Total CK 45  7 - 177 (U/L)    CK, MB 2.6  0.3 - 4.0 (ng/mL)    Troponin I <0.30  <0.30 (ng/mL)    Relative Index RELATIVE INDEX IS INVALID  0.0 - 2.5    PRO B NATRIURETIC PEPTIDE     Status: Normal   Collection  Time   01/18/11  1:45 PM      Component Value Range Comment   BNP, POC 47.2  0 - 125 (pg/mL)   BLOOD GAS, ARTERIAL     Status: Abnormal   Collection Time   01/18/11  3:40 PM      Component Value Range Comment   O2 Content 21.0      pH, Arterial 7.681 (*) 7.350 - 7.400     pCO2 arterial 14.9 (*) 35.0 - 45.0 (mmHg)    pO2, Arterial 122.0 (*) 80.0 - 100.0 (mmHg)    Bicarbonate 17.9 (*) 20.0 - 24.0 (mEq/L)    TCO2 14.9  0 - 100 (mmol/L)    Acid-base deficit 2.8 (*) 0.0 - 2.0 (mmol/L)    O2 Saturation 99.1      Patient temperature 37.0      Allens test (pass/fail) PASS  PASS     Dg Chest 2 View  01/18/2011  *RADIOLOGY REPORT*  Clinical Data: Shortness of breath.  CHEST - 2 VIEW  Comparison: Chest x-ray 06/02/2010.  Findings: The cardiac silhouette, mediastinal and hilar contours are within normal limits and stable. The lungs are clear.  No pleural effusions.  The bony thorax is intact.  IMPRESSION: Normal chest x-ray.  No change.  Original Report Authenticated By: P. Loralie Champagne, M.D.   EKG shows sinus rhythm at 88 beats per minute normal axis. Intervals appear to be in the normal range. No Q waves. No concerning ST or T-wave changes are noted. No older EKGs available for comparison.  Assessment/Plan  Principal Problem:  *Abdominal pain, acute, epigastric Active Problems:  Dyspnea  Chest pain  Nausea & vomiting  COPD (chronic obstructive pulmonary disease)  Anxiety and depression   #1 abdominal pain with nausea, vomiting: Etiology remains unclear. Could be due to irritation of the gastric lining from recent antibiotics. She does have a history of esophageal candidiasis as well. At this time we will proceed with CT scan of her abdomen, pelvis with oral contrast only. This is to rule out obstruction and other concerning pathology. We will put her on PPI. We'll check a lipase level and her hepatic function test.  #2 dyspnea and chest pain: This has been an ongoing issue for the last  3-4 weeks. Chest x-ray is clear. EKG is unremarkable. One set of cardiac enzymes are negative, as is the d-dimer. We will proceed to rule her out for ACS. Since she hasn't had an echocardiogram we will proceed with that. If all of these testing is negative she may require evaluation by a pulmonologist. Due to her history of COPD, we'll put her on nebulizer treatments and continue with steroids.  #3 leukocytosis is probably from recent steroids. She is afebrile.  #4 history of anxiety and depression: This may be playing some role in her presentation. We will put her on Ativan.  #5 respiratory alkalosis: This is probably from hyperventilation.  #6 history of COPD as above.  #7 history of hypertension. Continue to monitor. Blood pressure closely.  #8  DVT, prophylaxis with heparin.  #9 mildly elevated creatinine: Give gentle IV hydration and followup labs in the morning,  She's a full code.  Further management decisions will depend on results of further testing and patient's response to treatment.  Eryc Bodey 01/18/2011, 9:00 PM

## 2011-01-18 NOTE — ED Notes (Signed)
zofran given. Pt slightly anxious.

## 2011-01-18 NOTE — ED Notes (Signed)
I evaluated the patient had treatment in the ED. She states that the intravenous Ativan helped her somewhat. She is still very anxious, tearful and upset. She states that she is upset because there is no diagnosis for her ongoing trouble with breathing. She also states that she has Ativan at home even know that was not on the initial medication record. I discussed the options with her and she was very hesitant to leave and go home. She had additional complaint of constipation. That is ongoing and makes it hard to eat. She states that she has vomited due to the constipation. After considering the offer for discharge medications she decided that she wanted to be seen by the hospitalist to consider admission for observation. Repeat vitals are normal except a respiratory rate was slightly increased at 23.  Flint Melter, MD 01/18/11 1924

## 2011-01-18 NOTE — ED Notes (Signed)
Pt is crying and very upset. Pt states she is scared to go home because she can not breath. Vital signs WNL. NAD noted at this time.

## 2011-01-18 NOTE — ED Notes (Signed)
Pt breathing rapidly due to crying. Pt states she is just tired of everything, and tired of hurting. Pt states she has a lot on her right now. Family with pt at bedside. Vital signs WNL. NAD noted at this time.

## 2011-01-18 NOTE — ED Notes (Signed)
CRITICAL VALUE ALERT  Critical value received: ph 7.68 and co2 14.9  Date of notification: 01/18/2011  Time of notification: 1559  Critical value read back:yes  Nurse who received alert:  Doretha Sou RN  MD notified (1st page):  Dr Bebe Shaggy  Time of first page:  1600  MD notified (2nd page):  Time of second page:  Responding MD: Dr Bebe Shaggy  Time MD responded:  1600

## 2011-01-18 NOTE — ED Notes (Signed)
Pt c/o sob pt states she has copd and has been seen by her pcp and was treated with no relief. Pt states she has taken 3 different antibiotics in the past few weeks along with her neb tx and nothing has improved her breathing.

## 2011-01-18 NOTE — ED Provider Notes (Signed)
History  Scribed for Dr. Bebe Shaggy, the patient was seen in room APA05. The chart was scribed by Gilman Schmidt. The patients care was started at 1300.  CSN: [id number] Arrival date & time: 01/18/2011 11:54 AM  Chief Complaint  Patient presents with  . Shortness of Breath    HPI [name] is a 50 y.o. female with a history of COPD, Bronchitis, and GERD who presents to the Emergency Department complaining of SOB. Pt reports having a sinus infection and having a Z-pack treatment at [facility] ~3 weeks ago with symptoms relieved. Two weeks ago, pt presented with SOB and a productive cough (no blood), wheezing, and weakness. States symptoms worsened last night. No syncope.  Additionally reports gaining ~30 pounds within the past three months, with bloated abdomen.  Pt also reports chest pain that feels like constant pressure with sharp pain going through back. Notes that a dull pressure in chest is also exacerbated by movement. Associated symptoms of N/V for a few weeks. States she was diagnosed with bronchitis and has had 2 doses of Z-pack and avelox in past three weeks with no relief. Currently on steroids for two days. No history of heart failure, heart attack, or stroke. Has not taken pain meds in five days. Additionally notes candida esophagitis in May 2009 and abdominal hernia 3-4 years ago. There are no other associated symptoms and no other alleviating or aggravating factors.    PAST MEDICAL HISTORY:  Past Medical History  Diagnosis Date  . COPD (chronic obstructive pulmonary disease)   . Bronchitis   . Candida infection, esophageal   . Hypertension   . Depression   . Anxiety   . GERD (gastroesophageal reflux disease)      PAST SURGICAL HISTORY:  Past Surgical History  Procedure Date  . Cholecystectomy   . Tubal ligation   . Hemorrhoid surgery      MEDICATIONS:  Previous Medications   No medications on file     ALLERGIES:  Allergies as of 01/18/2011 - Review Complete  01/18/2011  Allergen Reaction Noted  . Iodine    . Pregabalin       FAMILY HISTORY:  History reviewed. No pertinent family history.   SOCIAL HISTORY: History  Substance Use Topics  . Smoking status: Current Everyday Smoker -- 0.5 packs/day  . Smokeless tobacco: Not on file  . Alcohol Use: Yes     occasionally     Review of Systems  Constitutional: Positive for unexpected weight change.  Respiratory: Positive for cough, shortness of breath and wheezing.   Cardiovascular: Positive for chest pain.  Gastrointestinal: Positive for nausea and vomiting.  Neurological: Positive for weakness.  All other systems reviewed and are negative.    Allergies  Iodine and Pregabalin  Home Medications  No current outpatient prescriptions on file.  BP 116/71  Pulse 100  Temp(Src) 97.8 F (36.6 C) (Oral)  Resp 22  SpO2 100%  LMP 12/04/2010  Physical Exam CONSTITUTIONAL: Well developed/well nourished HEAD AND FACE: Normocephalic/atraumatic EYES: EOMI/PERRL ENMT: Mucous membranes moist, uvula midline, pharynx normal, no signs of thrush NECK: supple no meningeal signs, no JVD SPINE:entire spine nontender CV: S1/S2 noted, no murmurs/rubs/gallops noted LUNGS: Lungs are clear to auscultation bilaterally, no apparent distress, tachypnea  ABDOMEN: soft, nontender, no rebound or guarding GU:no cva tenderness NEURO: Pt is awake/alert, moves all extremitiesx4 EXTREMITIES: pulses normal, full ROM SKIN: warm, color normal PSYCH: no abnormalities of mood noted  ED Course  Procedures   OTHER DATA REVIEWED: Nursing notes,  vital signs, and past medical records reviewed.  DIAGNOSTIC STUDIES: Oxygen Saturation is 100% on room air, normal by my interpretation.     Date: 01/18/2011  Rate: 88  Rhythm: normal sinus rhythm  QRS Axis: right  Intervals: normal  ST/T Wave abnormalities: nonspecific ST changes  Conduction Disutrbances:none  Narrative Interpretation:   Old EKG Reviewed:  unchanged   RADIOLOGY:  DG Chest 2 View. Reviewed by me.  IMPRESSION: Normal chest x-ray. No change. Original Report Authenticated By: P. Loralie Champagne, M.D.  ED COURSE / COORDINATION OF CARE: 1300:  - Patient evaluated by ED physician, DG Chest, EKG ordered xrays reviewed and considered All labs/vitals reviewed and considered    4:27 PM  Pt tachypneic, but no signs of CHF/PE, doubt ACS CXR clear Does have hyperventilation, likely cause of alkalosis Will need anxiolytic and reassessment D/w dr Effie Shy to f/u after pt receives meds   SCRIBE ATTESTATION: I personally performed the services described in this documentation, which was scribed in my presence. The recorded information has been reviewed and considered. Joya Gaskins, MD             Joya Gaskins, MD 01/18/11 [phone]

## 2011-01-19 DIAGNOSIS — E871 Hypo-osmolality and hyponatremia: Secondary | ICD-10-CM | POA: Diagnosis not present

## 2011-01-19 DIAGNOSIS — K859 Acute pancreatitis without necrosis or infection, unspecified: Secondary | ICD-10-CM | POA: Diagnosis present

## 2011-01-19 LAB — CBC
HCT: 37.4 % (ref 36.0–46.0)
Hemoglobin: 12.7 g/dL (ref 12.0–15.0)
MCHC: 34 g/dL (ref 30.0–36.0)

## 2011-01-19 LAB — COMPREHENSIVE METABOLIC PANEL
ALT: 10 U/L (ref 0–35)
AST: 12 U/L (ref 0–37)
Calcium: 8.7 mg/dL (ref 8.4–10.5)
GFR calc Af Amer: >60 mL/min (ref >60)
Glucose, Bld: 159 mg/dL — ABNORMAL HIGH (ref 70–99)
Sodium: 129 mEq/L — ABNORMAL LOW (ref 135–145)
Total Protein: 6.6 g/dL (ref 6.0–8.3)

## 2011-01-19 LAB — CARDIAC PANEL(CRET KIN+CKTOT+MB+TROPI)
CK, MB: 3.2 ng/mL (ref 0.3–4.0)
CK, MB: 3.3 ng/mL (ref 0.3–4.0)
Relative Index: INVALID (ref 0.0–2.5)
Relative Index: INVALID (ref 0.0–2.5)
Total CK: 65 U/L (ref 7–177)

## 2011-01-19 LAB — AMYLASE: Amylase: 76 U/L (ref 0–105)

## 2011-01-19 LAB — LIPID PANEL
Cholesterol: 229 mg/dL — ABNORMAL HIGH (ref 0–200)
HDL: 76 mg/dL (ref >39)
LDL Cholesterol: 134 mg/dL — ABNORMAL HIGH (ref 0–99)
Total CHOL/HDL Ratio: 3 RATIO
Triglycerides: 97 mg/dL (ref <150)
VLDL: 19 mg/dL (ref 0–40)

## 2011-01-19 LAB — BILIRUBIN, DIRECT: Bilirubin, Direct: 0.1 mg/dL (ref 0.0–0.3)

## 2011-01-19 MED ORDER — SIMETHICONE 80 MG PO CHEW
160.0000 mg | CHEWABLE_TABLET | Freq: Four times a day (QID) | ORAL | Status: DC | PRN
  Administered 2011-01-21: 160 mg via ORAL
  Filled 2011-01-19: qty 2

## 2011-01-19 MED ORDER — SODIUM CHLORIDE 0.9 % IV SOLN
INTRAVENOUS | Status: DC
  Administered 2011-01-19 – 2011-01-23 (×12): via INTRAVENOUS

## 2011-01-19 MED ORDER — HYDROCOD POLST-CHLORPHEN POLST 10-8 MG/5ML PO LQCR
5.0000 mL | Freq: Two times a day (BID) | ORAL | Status: DC | PRN
  Administered 2011-01-19 – 2011-01-21 (×5): 5 mL via ORAL
  Filled 2011-01-19 (×5): qty 5

## 2011-01-19 MED ORDER — FLUTICASONE-SALMETEROL 115-21 MCG/ACT IN AERO
2.0000 | INHALATION_SPRAY | Freq: Two times a day (BID) | RESPIRATORY_TRACT | Status: DC
  Administered 2011-01-20 – 2011-01-23 (×6): 2 via RESPIRATORY_TRACT

## 2011-01-19 MED ORDER — ALBUTEROL SULFATE (5 MG/ML) 0.5% IN NEBU
2.5000 mg | INHALATION_SOLUTION | Freq: Three times a day (TID) | RESPIRATORY_TRACT | Status: DC
  Administered 2011-01-19 – 2011-01-22 (×10): 2.5 mg via RESPIRATORY_TRACT
  Filled 2011-01-19 (×10): qty 0.5

## 2011-01-19 MED ORDER — SODIUM CHLORIDE 0.9 % IV SOLN
Freq: Once | INTRAVENOUS | Status: AC
  Administered 2011-01-19: 14:00:00 via INTRAVENOUS

## 2011-01-19 MED ORDER — PANTOPRAZOLE SODIUM 40 MG IV SOLR
40.0000 mg | Freq: Two times a day (BID) | INTRAVENOUS | Status: DC
  Administered 2011-01-19 – 2011-01-21 (×5): 40 mg via INTRAVENOUS
  Filled 2011-01-19 (×5): qty 40

## 2011-01-19 MED ORDER — SODIUM CHLORIDE 0.9 % IJ SOLN
INTRAMUSCULAR | Status: AC
  Administered 2011-01-19: 10 mL
  Filled 2011-01-19: qty 10

## 2011-01-19 MED ORDER — HYPROMELLOSE (GONIOSCOPIC) 2.5 % OP SOLN
1.0000 [drp] | Freq: Three times a day (TID) | OPHTHALMIC | Status: DC
  Filled 2011-01-19: qty 15

## 2011-01-19 NOTE — Progress Notes (Signed)
Subjective: Pt c/o abdominal pain in epigastrium, with associated bloating. Pt also c/o SOB, and heartburn.  Objective: Vital signs in last 24 hours: Filed Vitals:   01/18/11 2309 01/19/11 0605 01/19/11 0853 01/19/11 0855  BP:  105/68    Pulse:  95  89  Temp:  97.9 F (36.6 C)    TempSrc:      Resp:  20  24  Height:      Weight:      SpO2: 99% 95% 96% 96%    Intake/Output Summary (Last 24 hours) at 01/19/11 1410 Last data filed at 01/19/11 0900  Gross per 24 hour  Intake    695 ml  Output      3 ml  Net    692 ml    Weight change:   General: Alert, awake, oriented x3, in no acute distress. HEENT: No bruits, no goiter. Heart: Regular rate and rhythm, without murmurs, rubs, gallops. Lungs: Clear to auscultation bilaterally. Abdomen: Soft, tender to palpation in epigastrium, mild distension, positive bowel sounds. Extremities: No clubbing cyanosis or edema with positive pedal pulses. Neuro: Grossly intact, nonfocal.    Lab Results: Results for orders placed during the hospital encounter of 01/18/11 (from the past 24 hour(s))  BLOOD GAS, ARTERIAL     Status: Abnormal   Collection Time   01/18/11  3:40 PM      Component Value Range   O2 Content 21.0     pH, Arterial 7.681 (*) 7.350 - 7.400    pCO2 arterial 14.9 (*) 35.0 - 45.0 (mmHg)   pO2, Arterial 122.0 (*) 80.0 - 100.0 (mmHg)   Bicarbonate 17.9 (*) 20.0 - 24.0 (mEq/L)   TCO2 14.9  0 - 100 (mmol/L)   Acid-base deficit 2.8 (*) 0.0 - 2.0 (mmol/L)   O2 Saturation 99.1     Patient temperature 37.0     Allens test (pass/fail) PASS  PASS   CARDIAC PANEL(CRET KIN+CKTOT+MB+TROPI)     Status: Normal   Collection Time   01/19/11 12:15 AM      Component Value Range   Total CK 60  7 - 177 (U/L)   CK, MB 2.9  0.3 - 4.0 (ng/mL)   Troponin I <0.30  <0.30 (ng/mL)   Relative Index RELATIVE INDEX IS INVALID  0.0 - 2.5   CARDIAC PANEL(CRET KIN+CKTOT+MB+TROPI)     Status: Normal   Collection Time   01/19/11  8:14 AM   Component Value Range   Total CK 65  7 - 177 (U/L)   CK, MB 3.2  0.3 - 4.0 (ng/mL)   Troponin I <0.30  <0.30 (ng/mL)   Relative Index RELATIVE INDEX IS INVALID  0.0 - 2.5   COMPREHENSIVE METABOLIC PANEL     Status: Abnormal   Collection Time   01/19/11  8:14 AM      Component Value Range   Sodium 129 (*) 135 - 145 (mEq/L)   Potassium 4.0  3.5 - 5.1 (mEq/L)   Chloride 96  96 - 112 (mEq/L)   CO2 20  19 - 32 (mEq/L)   Glucose, Bld 159 (*) 70 - 99 (mg/dL)   BUN 14  6 - 23 (mg/dL)   Creatinine, Ser 1.61  0.50 - 1.10 (mg/dL)   Calcium 8.7  8.4 - 09.6 (mg/dL)   Total Protein 6.6  6.0 - 8.3 (g/dL)   Albumin 3.3 (*) 3.5 - 5.2 (g/dL)   AST 12  0 - 37 (U/L)   ALT 10  0 -  35 (U/L)   Alkaline Phosphatase 81  39 - 117 (U/L)   Total Bilirubin 0.4  0.3 - 1.2 (mg/dL)   GFR calc non Af Amer 56 (*) >60 (mL/min)   GFR calc Af Amer >60  >60 (mL/min)  CBC     Status: Normal   Collection Time   01/19/11  8:14 AM      Component Value Range   WBC 10.5  4.0 - 10.5 (K/uL)   RBC 4.20  3.87 - 5.11 (MIL/uL)   Hemoglobin 12.7  12.0 - 15.0 (g/dL)   HCT 16.1  09.6 - 04.5 (%)   MCV 89.0  78.0 - 100.0 (fL)   MCH 30.2  26.0 - 34.0 (pg)   MCHC 34.0  30.0 - 36.0 (g/dL)   RDW 40.9  81.1 - 91.4 (%)   Platelets 317  150 - 400 (K/uL)  AMYLASE     Status: Normal   Collection Time   01/19/11  8:14 AM      Component Value Range   Amylase 76  0 - 105 (U/L)  LIPASE, BLOOD     Status: Abnormal   Collection Time   01/19/11  8:14 AM      Component Value Range   Lipase 62 (*) 11 - 59 (U/L)     Micro: No results found for this or any previous visit (from the past 240 hour(s)).  Studies/Results: Ct Abdomen Pelvis Wo Contrast  01/19/2011  *RADIOLOGY REPORT*  Clinical Data: Abdominal pain, bloating, nausea/vomiting, status post cholecystectomy  CT ABDOMEN AND PELVIS WITHOUT CONTRAST  Technique:  Multidetector CT imaging of the abdomen and pelvis was performed following the standard protocol without intravenous  contrast.  Comparison: None.  Findings: Lung bases are essentially clear.  Unenhanced liver, spleen, pancreas, and adrenal glands within normal limits.  Status post cholecystectomy.  No intrahepatic or extrahepatic ductal dilatation.  Horseshoe kidney. Left extrarenal pelvis.  No renal calculi or hydronephrosis.  No evidence of bowel obstruction.  Normal appendix.  Atherosclerotic calcifications of the abdominal aorta and branch vessels.  No abdominopelvic ascites.  No suspicious abdominopelvic lymphadenopathy.  Uterus and bilateral ovaries are unremarkable.  Bladder is within normal limits.  Visualized osseous structures are within normal limits.  IMPRESSION: Normal appendix.  No evidence of bowel obstruction.  Horseshoe kidney.  No renal calculi or hydronephrosis.  No CT findings to account for the patient's abdominal complaints.  Original Report Authenticated By: Charline Bills, M.D.   Dg Chest 2 View  01/18/2011  *RADIOLOGY REPORT*  Clinical Data: Shortness of breath.  CHEST - 2 VIEW  Comparison: Chest x-ray 06/02/2010.  Findings: The cardiac silhouette, mediastinal and hilar contours are within normal limits and stable. The lungs are clear.  No pleural effusions.  The bony thorax is intact.  IMPRESSION: Normal chest x-ray.  No change.  Original Report Authenticated By: P. Loralie Champagne, M.D.    Medications:     . sodium chloride   Intravenous Once  . albuterol  2.5 mg Nebulization TID  . albuterol  5 mg Nebulization Once  . aspirin EC  81 mg Oral Daily  . buPROPion  150 mg Oral TID  . fluticasone-salmeterol  2 puff Inhalation BID  . heparin  5,000 Units Subcutaneous Q8H  . hydrocortisone  1 application Rectal BID  . LORazepam  2 mg Intravenous Once  . methylPREDNISolone sodium succinate  60 mg Intravenous Q8H  . ondansetron (ZOFRAN) IV  4 mg Intravenous Once  . pantoprazole  40 mg  Oral BID AC  . pantoprazole (PROTONIX) IV  40 mg Intravenous Once  . sodium chloride  1,000 mL Intravenous  Once  . sodium chloride      . DISCONTD: fluticasone-salmeterol  2 puff Inhalation BID     Assessment/Plan Principal Problem:  *Pancreatitis Active Problems:  Abdominal pain, acute, epigastric  Dyspnea  Chest pain  Nausea & vomiting  COPD (chronic obstructive pulmonary disease)  Anxiety and depression  Hyponatremia     1. Acute Pancreatitis- unknown etiology. Patient denies any heavy ETOH abuse. Pt with hx of cholecystectomy. Patient in abdominal pain. Will check lipase, fasting lipid panel, Abdominal US. Will continue bowel rest, Give 1 L bolus of NS, then increase IVF to 150cc/hr. Pain management and follow. 2. Abdominal pain secondary to #1. CT abdomen/pelvis negative. Simethicone. 3. Dyspnea/CP - maybe sec to abdominal bloating and #1. CXR negative. Cardiac enzymes neg x 1. Check a d dimer.  Follow. If no improvement will repeat CXR and ABG. 4. COPD - continue nebs/oxygen/ steriod taper.  5. Hyponatremia - Likely secondary to volume depletion. Will check FENA,  Urine and serum osmolality, IVF, follow. 6. Depression/Anxiety - Continue home dose wellbutrin, and ativan as needed. 7. Prophylaxis - PPI for GI, heparin for DVT.    LOS: 1 day   [name] 01/19/2011, 2:10 PM

## 2011-01-20 ENCOUNTER — Ambulatory Visit (HOSPITAL_COMMUNITY): Payer: BC Managed Care – PPO

## 2011-01-20 DIAGNOSIS — Z9889 Other specified postprocedural states: Secondary | ICD-10-CM

## 2011-01-20 DIAGNOSIS — R0609 Other forms of dyspnea: Secondary | ICD-10-CM

## 2011-01-20 DIAGNOSIS — R0989 Other specified symptoms and signs involving the circulatory and respiratory systems: Secondary | ICD-10-CM

## 2011-01-20 HISTORY — DX: Other specified postprocedural states: Z98.890

## 2011-01-20 HISTORY — PX: ESOPHAGOGASTRODUODENOSCOPY: SHX1529

## 2011-01-20 LAB — CBC
HCT: 36.2 % (ref 36.0–46.0)
Hemoglobin: 12.1 g/dL (ref 12.0–15.0)
MCH: 30.6 pg (ref 26.0–34.0)
MCHC: 33.4 g/dL (ref 30.0–36.0)
MCV: 91.4 fL (ref 78.0–100.0)
RBC: 3.96 MIL/uL (ref 3.87–5.11)

## 2011-01-20 LAB — DIFFERENTIAL
Basophils Relative: 0 % (ref 0–1)
Eosinophils Absolute: 0 10*3/uL (ref 0.0–0.7)
Lymphs Abs: 1.6 10*3/uL (ref 0.7–4.0)
Monocytes Absolute: 0.8 10*3/uL (ref 0.1–1.0)
Monocytes Relative: 5 % (ref 3–12)

## 2011-01-20 LAB — BASIC METABOLIC PANEL
BUN: 11 mg/dL (ref 6–23)
Creatinine, Ser: 1 mg/dL (ref 0.50–1.10)
GFR calc Af Amer: 75 mL/min — ABNORMAL LOW (ref >90)
GFR calc non Af Amer: 65 mL/min — ABNORMAL LOW (ref >90)
Glucose, Bld: 112 mg/dL — ABNORMAL HIGH (ref 70–99)
Potassium: 3.5 mEq/L (ref 3.5–5.1)

## 2011-01-20 MED ORDER — SODIUM CHLORIDE 0.9 % IJ SOLN
INTRAMUSCULAR | Status: AC
  Administered 2011-01-20: 10 mL
  Filled 2011-01-20: qty 10

## 2011-01-20 MED ORDER — SODIUM CHLORIDE 0.9 % IJ SOLN
INTRAMUSCULAR | Status: AC
  Administered 2011-01-21
  Filled 2011-01-20: qty 10

## 2011-01-20 MED ORDER — POLYVINYL ALCOHOL 1.4 % OP SOLN
1.0000 [drp] | Freq: Three times a day (TID) | OPHTHALMIC | Status: DC
  Administered 2011-01-20 – 2011-01-22 (×7): 1 [drp] via OPHTHALMIC
  Filled 2011-01-20 (×2): qty 15

## 2011-01-20 MED ORDER — METHYLPREDNISOLONE SODIUM SUCC 125 MG IJ SOLR
60.0000 mg | Freq: Two times a day (BID) | INTRAMUSCULAR | Status: DC
  Administered 2011-01-20 – 2011-01-21 (×2): 60 mg via INTRAVENOUS
  Filled 2011-01-20 (×3): qty 2

## 2011-01-20 MED ORDER — LORAZEPAM 2 MG/ML IJ SOLN
1.0000 mg | Freq: Three times a day (TID) | INTRAMUSCULAR | Status: DC | PRN
  Administered 2011-01-20 – 2011-01-22 (×4): 1 mg via INTRAVENOUS
  Filled 2011-01-20 (×6): qty 1

## 2011-01-20 MED ORDER — MORPHINE SULFATE 2 MG/ML IJ SOLN
2.0000 mg | Freq: Once | INTRAMUSCULAR | Status: AC
  Administered 2011-01-20: 2 mg via INTRAVENOUS

## 2011-01-20 NOTE — Progress Notes (Signed)
*  PRELIMINARY RESULTS* Echocardiogram 2D Echocardiogram has been performed.  Conrad  01/20/2011, 10:40 AM

## 2011-01-20 NOTE — Progress Notes (Signed)
Subjective: Patient is not complaining of excruciating epigastric abdominal pain patient also anxious as well. Vision feels that her shortness of breath might be related to her anxiety. Patient also with states also has some chest pain.  Objective: Vital signs in last 24 hours: Filed Vitals:   01/19/11 1922 01/19/11 2200 01/20/11 0536 01/20/11 0708  BP:  131/76 107/69   Pulse:  111 99   Temp:  98.1 F (36.7 C) 97.5 F (36.4 C)   TempSrc:  Oral Oral   Resp:  16 16   Height:      Weight:      SpO2: 100% 95% 98% 99%    Intake/Output Summary (Last 24 hours) at 01/20/11 1057 Last data filed at 01/20/11 0640  Gross per 24 hour  Intake   2710 ml  Output   2200 ml  Net    510 ml    Weight change:   General: Alert, awake, oriented x3, in no acute distress. HEENT: No bruits, no goiter. Heart: Tachycardic, without murmurs, rubs, gallops. Lungs: Clear to auscultation bilaterally. Abdomen: Soft, TTP in epigastrium, ND, +BS Extremities: No clubbing cyanosis or edema with positive pedal pulses. Neuro: Grossly intact, nonfocal.    Lab Results: Results for orders placed during the hospital encounter of 01/18/11 (from the past 24 hour(s))  CARDIAC PANEL(CRET KIN+CKTOT+MB+TROPI)     Status: Normal   Collection Time   01/19/11  4:32 PM      Component Value Range   Total CK 68  7 - 177 (U/L)   CK, MB 3.3  0.3 - 4.0 (ng/mL)   Troponin I <0.30  <0.30 (ng/mL)   Relative Index RELATIVE INDEX IS INVALID  0.0 - 2.5   SODIUM, URINE, RANDOM     Status: Normal   Collection Time   01/19/11  5:26 PM      Component Value Range   Sodium, Ur <10    SODIUM, URINE, RANDOM     Status: Normal   Collection Time   01/19/11  9:22 PM      Component Value Range   Sodium, Ur 13    CREATININE, URINE, RANDOM     Status: Normal   Collection Time   01/19/11  9:22 PM      Component Value Range   Creatinine, Urine 17.5    LIPASE, BLOOD     Status: Normal   Collection Time   01/20/11  6:19 AM   Component Value Range   Lipase 51  11 - 59 (U/L)  CBC     Status: Abnormal   Collection Time   01/20/11  6:19 AM      Component Value Range   WBC 15.8 (*) 4.0 - 10.5 (K/uL)   RBC 3.96  3.87 - 5.11 (MIL/uL)   Hemoglobin 12.1  12.0 - 15.0 (g/dL)   HCT 04.5  40.9 - 81.1 (%)   MCV 91.4  78.0 - 100.0 (fL)   MCH 30.6  26.0 - 34.0 (pg)   MCHC 33.4  30.0 - 36.0 (g/dL)   RDW 91.4  78.2 - 95.6 (%)   Platelets 338  150 - 400 (K/uL)  DIFFERENTIAL     Status: Abnormal   Collection Time   01/20/11  6:19 AM      Component Value Range   Neutrophils Relative 85 (*) 43 - 77 (%)   Neutro Abs 13.4 (*) 1.7 - 7.7 (K/uL)   Lymphocytes Relative 10 (*) 12 - 46 (%)   Lymphs Abs 1.6  0.7 - 4.0 (K/uL)   Monocytes Relative 5  3 - 12 (%)   Monocytes Absolute 0.8  0.1 - 1.0 (K/uL)   Eosinophils Relative 0  0 - 5 (%)   Eosinophils Absolute 0.0  0.0 - 0.7 (K/uL)   Basophils Relative 0  0 - 1 (%)   Basophils Absolute 0.0  0.0 - 0.1 (K/uL)  BASIC METABOLIC PANEL     Status: Abnormal   Collection Time   01/20/11  6:19 AM      Component Value Range   Sodium 140  135 - 145 (mEq/L)   Potassium 3.5  3.5 - 5.1 (mEq/L)   Chloride 109  96 - 112 (mEq/L)   CO2 22  19 - 32 (mEq/L)   Glucose, Bld 112 (*) 70 - 99 (mg/dL)   BUN 11  6 - 23 (mg/dL)   Creatinine, Ser 9.14  0.50 - 1.10 (mg/dL)   Calcium 8.2 (*) 8.4 - 10.5 (mg/dL)   GFR calc non Af Amer 65 (*) >90 (mL/min)   GFR calc Af Amer 75 (*) >90 (mL/min)     Micro: No results found for this or any previous visit (from the past 240 hour(s)).  Studies/Results: Ct Abdomen Pelvis Wo Contrast  01/19/2011  *RADIOLOGY REPORT*  Clinical Data: Abdominal pain, bloating, nausea/vomiting, status post cholecystectomy  CT ABDOMEN AND PELVIS WITHOUT CONTRAST  Technique:  Multidetector CT imaging of the abdomen and pelvis was performed following the standard protocol without intravenous contrast.  Comparison: None.  Findings: Lung bases are essentially clear.  Unenhanced liver,  spleen, pancreas, and adrenal glands within normal limits.  Status post cholecystectomy.  No intrahepatic or extrahepatic ductal dilatation.  Horseshoe kidney. Left extrarenal pelvis.  No renal calculi or hydronephrosis.  No evidence of bowel obstruction.  Normal appendix.  Atherosclerotic calcifications of the abdominal aorta and branch vessels.  No abdominopelvic ascites.  No suspicious abdominopelvic lymphadenopathy.  Uterus and bilateral ovaries are unremarkable.  Bladder is within normal limits.  Visualized osseous structures are within normal limits.  IMPRESSION: Normal appendix.  No evidence of bowel obstruction.  Horseshoe kidney.  No renal calculi or hydronephrosis.  No CT findings to account for the patient's abdominal complaints.  Original Report Authenticated By: Charline Bills, M.D.   Dg Chest 2 View  01/18/2011  *RADIOLOGY REPORT*  Clinical Data: Shortness of breath.  CHEST - 2 VIEW  Comparison: Chest x-ray 06/02/2010.  Findings: The cardiac silhouette, mediastinal and hilar contours are within normal limits and stable. The lungs are clear.  No pleural effusions.  The bony thorax is intact.  IMPRESSION: Normal chest x-ray.  No change.  Original Report Authenticated By: P. Loralie Champagne, M.D.    Medications:    . sodium chloride   Intravenous Once  . albuterol  2.5 mg Nebulization TID  . buPROPion  150 mg Oral TID  . fluticasone-salmeterol  2 puff Inhalation BID  . heparin  5,000 Units Subcutaneous Q8H  . hydrocortisone  1 application Rectal BID  . methylPREDNISolone sodium succinate  60 mg Intravenous Q8H  .  morphine injection  2 mg Intravenous Once  . pantoprazole (PROTONIX) IV  40 mg Intravenous BID  . polyvinyl alcohol  1 drop Both Eyes TID  . sodium chloride      . DISCONTD: aspirin EC  81 mg Oral Daily  . DISCONTD: hydroxypropyl methylcellulose  1 drop Both Eyes TID  . DISCONTD: pantoprazole  40 mg Oral BID AC     Assessment/Plan Principal Problem:   *  Pancreatitis Active Problems:  Abdominal pain, acute, epigastric  Dyspnea  Chest pain  Nausea & vomiting  COPD (chronic obstructive pulmonary disease)  Anxiety and depression  Hyponatremia   1. Acute Pancreatitis- unknown etiology. Patient denies any heavy ETOH abuse. Pt with hx of cholecystectomy. Patient still with significant  abdominal pain. Lipase levels trending down. fasting lipid panel with total cholesterol of 229 triglycerides of 97., Abdominal US pending. Will continue bowel rest, continue IV fluids at 150 cc per hour pain management and follow.  2. Abdominal pain secondary to #1. CT abdomen/pelvis negative. Simethicone.  3. Dyspnea/CP - maybe sec to abdominal bloating and #1. CXR negative. Cardiac enzymes neg x 3.  d dimer negative.. 2-D echo is pending. Follow. If no improvement will repeat CXR and ABG.  4. COPD - continue nebs/oxygen/ steriod taper.  5. Hyponatremia - Likely secondary to volume depletion. Improved. Urine sodium was less than 10 continue IV fluids follow. 6. Depression/Anxiety - Continue home dose wellbutrin, and ativan as needed.  7. Prophylaxis - PPI for GI, heparin for DVT.  8. leukocytosis-secondary to steroids will follow.      LOS: 2 days   [facility] 01/20/2011, 10:57 AM

## 2011-01-21 ENCOUNTER — Encounter (HOSPITAL_COMMUNITY): Payer: Self-pay | Admitting: Gastroenterology

## 2011-01-21 DIAGNOSIS — E876 Hypokalemia: Secondary | ICD-10-CM | POA: Diagnosis not present

## 2011-01-21 DIAGNOSIS — R1310 Dysphagia, unspecified: Secondary | ICD-10-CM | POA: Diagnosis not present

## 2011-01-21 LAB — CBC
HCT: 32.5 % — ABNORMAL LOW (ref 36.0–46.0)
HCT: 41.7
Hemoglobin: 14.5
MCH: 30.3 pg (ref 26.0–34.0)
MCHC: 34.7
MCV: 91.3 fL (ref 78.0–100.0)
Platelets: 357
RBC: 3.56 MIL/uL — ABNORMAL LOW (ref 3.87–5.11)
RDW: 12.1
WBC: 12.5 10*3/uL — ABNORMAL HIGH (ref 4.0–10.5)

## 2011-01-21 LAB — URINALYSIS, ROUTINE W REFLEX MICROSCOPIC
Bilirubin Urine: NEGATIVE
Ketones, ur: NEGATIVE
Nitrite: NEGATIVE
Protein, ur: NEGATIVE
Urobilinogen, UA: 0.2

## 2011-01-21 LAB — BASIC METABOLIC PANEL
BUN: 12 mg/dL (ref 6–23)
BUN: 8
CO2: 28
Calcium: 9.3
Chloride: 112 mEq/L (ref 96–112)
GFR calc Af Amer: 69 mL/min — ABNORMAL LOW (ref >90)
Glucose, Bld: 97
Potassium: 3.1 mEq/L — ABNORMAL LOW (ref 3.5–5.1)
Potassium: 3.3 — ABNORMAL LOW
Sodium: 142 mEq/L (ref 135–145)
Sodium: 133 — ABNORMAL LOW

## 2011-01-21 LAB — DIFFERENTIAL
Basophils Absolute: 0 10*3/uL (ref 0.0–0.1)
Basophils Absolute: 0
Basophils Relative: 0 % (ref 0–1)
Basophils Relative: 0
Eosinophils Absolute: 0.5
Eosinophils Relative: 5
Monocytes Absolute: 0.4
Monocytes Relative: 7 % (ref 3–12)
Neutro Abs: 8.4 10*3/uL — ABNORMAL HIGH (ref 1.7–7.7)
Neutrophils Relative %: 67 % (ref 43–77)

## 2011-01-21 LAB — RAPID URINE DRUG SCREEN, HOSP PERFORMED
Cocaine: NOT DETECTED
Tetrahydrocannabinol: NOT DETECTED

## 2011-01-21 LAB — MAGNESIUM: Magnesium: 2.2 mg/dL (ref 1.5–2.5)

## 2011-01-21 MED ORDER — SODIUM CHLORIDE 0.9 % IJ SOLN
INTRAMUSCULAR | Status: AC
  Administered 2011-01-21: 10 mL
  Filled 2011-01-21: qty 10

## 2011-01-21 MED ORDER — POTASSIUM CHLORIDE CRYS ER 20 MEQ PO TBCR
40.0000 meq | EXTENDED_RELEASE_TABLET | ORAL | Status: AC
  Administered 2011-01-21 (×2): 40 meq via ORAL
  Filled 2011-01-21 (×2): qty 2

## 2011-01-21 MED ORDER — SODIUM CHLORIDE 0.9 % IJ SOLN
INTRAMUSCULAR | Status: AC
  Administered 2011-01-21: 12:00:00
  Filled 2011-01-21: qty 10

## 2011-01-21 MED ORDER — SODIUM CHLORIDE 0.9 % IN NEBU
INHALATION_SOLUTION | RESPIRATORY_TRACT | Status: AC
  Administered 2011-01-21: 3 mL
  Filled 2011-01-21: qty 3

## 2011-01-21 MED ORDER — METHYLPREDNISOLONE SODIUM SUCC 125 MG IJ SOLR
60.0000 mg | INTRAMUSCULAR | Status: DC
  Administered 2011-01-22: 60 mg via INTRAVENOUS
  Filled 2011-01-21: qty 2

## 2011-01-21 MED ORDER — PANTOPRAZOLE SODIUM 40 MG PO TBEC
40.0000 mg | DELAYED_RELEASE_TABLET | Freq: Two times a day (BID) | ORAL | Status: DC
  Administered 2011-01-21 – 2011-01-23 (×4): 40 mg via ORAL
  Filled 2011-01-21 (×4): qty 1

## 2011-01-21 NOTE — Progress Notes (Signed)
Subjective: Reason is complaining of severe upper abdominal pain which hasn't improved since admission. Also states she has a burning sensation all the way from the back of her throat all the way down to her epigastrium. He does state that she's had a prior history of Candida esophagitis. 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  Objective: Vital signs in last 24 hours: Filed Vitals:   01/21/11 0140 01/21/11 0528 01/21/11 0728 01/21/11 1425  BP:  97/68    Pulse:  91    Temp:  97.9 F (36.6 C)    TempSrc:  Oral    Resp:  16    Height:      Weight:      SpO2: 100% 97% 95% 97%    Intake/Output Summary (Last 24 hours) at 01/21/11 1447 Last data filed at 01/21/11 1142  Gross per 24 hour  Intake 4147.5 ml  Output    700 ml  Net 3447.5 ml    Weight change:   General: Alert, awake, oriented x3, in no acute distress. HEENT: No bruits, no goiter. Heart: Regular rate and rhythm, without murmurs, rubs, gallops. Lungs: Clear to auscultation bilaterally. Abdomen:  Soft,  Epigastrium is exquisitely tender to palpation, nondistended, normal bowel sounds. Extremities: No clubbing cyanosis or edema with positive pedal pulses. Neuro: Grossly intact, nonfocal.    Lab Results: Results for orders placed during the hospital encounter of 01/18/11 (from the past 24 hour(s))  LIPASE, BLOOD     Status: Normal   Collection Time   01/21/11  5:03 AM      Component Value Range   Lipase 35  11 - 59 (U/L)  CBC     Status: Abnormal   Collection Time   01/21/11  5:03 AM      Component Value Range   WBC 12.5 (*) 4.0 - 10.5 (K/uL)   RBC 3.56 (*) 3.87 - 5.11 (MIL/uL)   Hemoglobin 10.8 (*) 12.0 - 15.0 (g/dL)   HCT 40.9 (*) 81.1 - 46.0 (%)   MCV 91.3  78.0 - 100.0 (fL)   MCH 30.3  26.0 - 34.0 (pg)   MCHC 33.2  30.0 - 36.0 (g/dL)   RDW 91.4  78.2 - 95.6 (%)   Platelets 266  150 - 400 (K/uL)  DIFFERENTIAL     Status:  Abnormal   Collection Time   01/21/11  5:03 AM      Component Value Range   Neutrophils Relative 67  43 - 77 (%)   Neutro Abs 8.4 (*) 1.7 - 7.7 (K/uL)   Lymphocytes Relative 26  12 - 46 (%)   Lymphs Abs 3.3  0.7 - 4.0 (K/uL)   Monocytes Relative 7  3 - 12 (%)   Monocytes Absolute 0.8  0.1 - 1.0 (K/uL)   Eosinophils Relative 0  0 - 5 (%)   Eosinophils Absolute 0.0  0.0 - 0.7 (K/uL)   Basophils Relative 0  0 - 1 (%)   Basophils Absolute 0.0  0.0 - 0.1 (K/uL)  BASIC METABOLIC PANEL     Status: Abnormal   Collection Time   01/21/11  5:03 AM      Component Value Range   Sodium 142  135 - 145 (mEq/L)   Potassium 3.1 (*) 3.5 - 5.1 (mEq/L)   Chloride 112  96 - 112 (mEq/L)   CO2 20  19 - 32 (mEq/L)   Glucose, Bld 76  70 -  99 (mg/dL)   BUN 12  6 - 23 (mg/dL)   Creatinine, Ser 2.44  0.50 - 1.10 (mg/dL)   Calcium 7.8 (*) 8.4 - 10.5 (mg/dL)   GFR calc non Af Amer 60 (*) >90 (mL/min)   GFR calc Af Amer 69 (*) >90 (mL/min)     Micro: No results found for this or any previous visit (from the past 240 hour(s)).  Studies/Results: US Abdomen Complete  01/20/2011  *RADIOLOGY REPORT*  Clinical Data:  Abdominal pain, question biliary pancreatitis  ULTRASOUND ABDOMEN:  Technique:  Sonography of upper abdominal structures was performed.  Comparison:  None Correlation:  CT abdomen 01/19/2011  Gallbladder:  Surgically absent  Common bile duct:  Normal post cholecystectomy caliber of 6 mm diameter.  Liver:  Normal appearance  IVC:  Normal appearance  Pancreas:  Incomplete visualization of distal pancreatic tail due to obscuration by bowel gas.  Visualized portions of pancreas normal appearance.  No peripancreatic fluid collections.  Spleen:  Normal appearance, 8.0 cm length  Right kidney:  9.0 cm length. 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  Poor visualization of the inferior pole. Visualized portions normal appearance.  Left kidney:  9.1 cm length. 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   Poor visualization of the inferior pole. Visualized portions normal appearance.  Aorta:  Predominately obscured by bowel gas, suboptimally assessed.  Other:  No free fluid  IMPRESSION: Horseshoe kidney. Incomplete visualization of distal pancreatic tail and aorta due to bowel gas and the inferior poles of the kidneys due to horseshoe morphology. Post cholecystectomy. No acute abnormalities.  Original Report Authenticated By: Lollie Marrow, M.D.    Medications:     . albuterol  2.5 mg Nebulization TID  . buPROPion  150 mg Oral TID  . fluticasone-salmeterol  2 puff Inhalation BID  . heparin  5,000 Units Subcutaneous Q8H  . hydrocortisone  1 application Rectal BID  . methylPREDNISolone sodium succinate  60 mg Intravenous Q12H  . pantoprazole  40 mg Oral BID AC  . polyvinyl alcohol  1 drop Both Eyes TID  . potassium chloride  40 mEq Oral Q4H  . sodium chloride      . sodium chloride      . sodium chloride      . sodium chloride      . DISCONTD: pantoprazole (PROTONIX) IV  40 mg Intravenous BID     Assessment/Plan Principal Problem:  *Pancreatitis Active Problems:  Abdominal pain, acute, epigastric  Dyspnea  Chest pain  Nausea & vomiting  COPD (chronic obstructive pulmonary disease)  Anxiety and depression  Hyponatremia  Hypokalemia  Odynophagia  1. Acute Pancreatitis- unknown etiology. Patient denies any heavy ETOH abuse. Pt with hx of cholecystectomy. Patient still with significant abdominal pain. Lipase levels trending down. fasting lipid panel with total cholesterol of 229 triglycerides of 97., Abdominal US negative for any biliary disease. Will continue bowel rest. Increase IV fluids  To 200 cc per hour, pain management and follow.  2. Abdominal pain secondary to #1. CT abdomen/pelvis negative. Simethicone.  3. Dyspnea/CP - maybe sec to abdominal bloating and #1. CXR negative. Cardiac enzymes neg x 3. d dimer negative.. 2-D echo normal with an EF of 60-65%.  Symptoms may be due  to esophageal/GI etiology as patient does state she does have a history of esophageal candidiasis and is complaining of a burning sensation in her throat.  4. Odynophagia - Pt with history of candida esophagitis, and hx of esophageal tumor per patient. Will  consult with GI for probable EGD and further evaluation and recommendations. 5. COPD - continue nebs/oxygen/ steriod taper.  6. Hyponatremia - Likely secondary to volume depletion. Improved. Urine sodium was less than 10 continue IV fluids follow.   7. hypokalemia- the magnesium level, replete. 6. Depression/Anxiety - Continue home dose wellbutrin, and ativan as needed.  7. Prophylaxis - PPI for GI, heparin for DVT.  8. leukocytosis-secondary to steroids will follow.     LOS: 3 days   Nelva Hauk 01/21/2011, 2:47 PM
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
## 2011-01-22 ENCOUNTER — Encounter (HOSPITAL_COMMUNITY): Payer: Self-pay | Admitting: Anesthesiology

## 2011-01-22 ENCOUNTER — Ambulatory Visit: Payer: BC Managed Care – PPO | Admitting: Orthopedic Surgery

## 2011-01-22 ENCOUNTER — Encounter (HOSPITAL_COMMUNITY)
Admission: EM | Disposition: A | Discharge status: Home / Self Care | Payer: Self-pay | Source: Home / Self Care | Attending: Internal Medicine

## 2011-01-22 ENCOUNTER — Encounter (HOSPITAL_COMMUNITY): Payer: Self-pay | Admitting: *Deleted

## 2011-01-22 ENCOUNTER — Inpatient Hospital Stay (HOSPITAL_COMMUNITY): Payer: BC Managed Care – PPO | Admitting: Anesthesiology

## 2011-01-22 ENCOUNTER — Other Ambulatory Visit: Payer: Self-pay | Admitting: Gastroenterology

## 2011-01-22 DIAGNOSIS — K229 Disease of esophagus, unspecified: Secondary | ICD-10-CM

## 2011-01-22 DIAGNOSIS — K2970 Gastritis, unspecified, without bleeding: Secondary | ICD-10-CM

## 2011-01-22 DIAGNOSIS — R1013 Epigastric pain: Secondary | ICD-10-CM

## 2011-01-22 DIAGNOSIS — K2990 Gastroduodenitis, unspecified, without bleeding: Secondary | ICD-10-CM

## 2011-01-22 LAB — DIFFERENTIAL
Basophils Relative: 0 % (ref 0–1)
Eosinophils Absolute: 0.1 10*3/uL (ref 0.0–0.7)
Lymphs Abs: 2.9 10*3/uL (ref 0.7–4.0)
Monocytes Absolute: 0.6 10*3/uL (ref 0.1–1.0)
Monocytes Relative: 6 % (ref 3–12)
Neutrophils Relative %: 63 % (ref 43–77)

## 2011-01-22 LAB — BASIC METABOLIC PANEL
BUN: 8 mg/dL (ref 6–23)
Creatinine, Ser: 0.99 mg/dL (ref 0.50–1.10)
GFR calc non Af Amer: 66 mL/min — ABNORMAL LOW (ref >90)
Glucose, Bld: 78 mg/dL (ref 70–99)
Potassium: 3.6 mEq/L (ref 3.5–5.1)

## 2011-01-22 LAB — CBC
HCT: 30.9 % — ABNORMAL LOW (ref 36.0–46.0)
Hemoglobin: 10.2 g/dL — ABNORMAL LOW (ref 12.0–15.0)
MCH: 30 pg (ref 26.0–34.0)
MCHC: 33 g/dL (ref 30.0–36.0)
MCV: 90.9 fL (ref 78.0–100.0)
RBC: 3.4 MIL/uL — ABNORMAL LOW (ref 3.87–5.11)

## 2011-01-22 SURGERY — ESOPHAGOGASTRODUODENOSCOPY (EGD) WITH PROPOFOL
Anesthesia: Monitor Anesthesia Care

## 2011-01-22 MED ORDER — SUCRALFATE 1 GM/10ML PO SUSP
1.0000 g | Freq: Three times a day (TID) | ORAL | Status: DC
  Administered 2011-01-22 – 2011-01-23 (×2): 1 g via ORAL
  Filled 2011-01-22 (×2): qty 10

## 2011-01-22 MED ORDER — ONDANSETRON HCL 4 MG/2ML IJ SOLN
4.0000 mg | Freq: Once | INTRAMUSCULAR | Status: AC | PRN
  Administered 2011-01-22: 4 mg via INTRAVENOUS

## 2011-01-22 MED ORDER — SODIUM CHLORIDE 0.9 % IN NEBU
INHALATION_SOLUTION | RESPIRATORY_TRACT | Status: AC
  Administered 2011-01-22: 3 mL
  Filled 2011-01-22: qty 3

## 2011-01-22 MED ORDER — PROPOFOL 10 MG/ML IV EMUL
INTRAVENOUS | Status: DC | PRN
  Administered 2011-01-22: 75 ug/kg/min via INTRAVENOUS

## 2011-01-22 MED ORDER — ONDANSETRON HCL 4 MG/2ML IJ SOLN
4.0000 mg | Freq: Once | INTRAMUSCULAR | Status: AC
  Administered 2011-01-22: 4 mg via INTRAVENOUS

## 2011-01-22 MED ORDER — MIDAZOLAM HCL 2 MG/2ML IJ SOLN
1.0000 mg | INTRAMUSCULAR | Status: DC | PRN
  Administered 2011-01-22: 2 mg via INTRAVENOUS

## 2011-01-22 MED ORDER — ALBUTEROL SULFATE (5 MG/ML) 0.5% IN NEBU
2.5000 mg | INHALATION_SOLUTION | Freq: Once | RESPIRATORY_TRACT | Status: AC
  Administered 2011-01-22: 2.5 mg via RESPIRATORY_TRACT
  Filled 2011-01-22: qty 20

## 2011-01-22 MED ORDER — MIDAZOLAM HCL 2 MG/2ML IJ SOLN
INTRAMUSCULAR | Status: AC
  Filled 2011-01-22: qty 2

## 2011-01-22 MED ORDER — PROPOFOL 10 MG/ML IV EMUL
INTRAVENOUS | Status: AC
  Filled 2011-01-22: qty 20

## 2011-01-22 MED ORDER — STERILE WATER FOR IRRIGATION IR SOLN
Status: DC | PRN
  Administered 2011-01-22: 13:00:00

## 2011-01-22 MED ORDER — GLYCOPYRROLATE 0.2 MG/ML IJ SOLN
0.2000 mg | Freq: Once | INTRAMUSCULAR | Status: AC
  Administered 2011-01-22: 0.2 mg via INTRAVENOUS

## 2011-01-22 MED ORDER — MIDAZOLAM HCL 2 MG/2ML IJ SOLN
INTRAMUSCULAR | Status: AC
  Administered 2011-01-22: 2 mg via INTRAVENOUS
  Filled 2011-01-22: qty 2

## 2011-01-22 MED ORDER — ALBUTEROL SULFATE (5 MG/ML) 0.5% IN NEBU
INHALATION_SOLUTION | RESPIRATORY_TRACT | Status: AC
  Administered 2011-01-22: 2.5 mg via RESPIRATORY_TRACT
  Filled 2011-01-22: qty 0.5

## 2011-01-22 MED ORDER — MIDAZOLAM HCL 5 MG/5ML IJ SOLN
INTRAMUSCULAR | Status: DC | PRN
  Administered 2011-01-22 (×2): 2 mg via INTRAVENOUS

## 2011-01-22 MED ORDER — FENTANYL CITRATE 0.05 MG/ML IJ SOLN
INTRAMUSCULAR | Status: AC
  Administered 2011-01-22: 50 ug via INTRAVENOUS
  Filled 2011-01-22: qty 2

## 2011-01-22 MED ORDER — FENTANYL CITRATE 0.05 MG/ML IJ SOLN
25.0000 ug | INTRAMUSCULAR | Status: DC | PRN
  Administered 2011-01-22: 50 ug via INTRAVENOUS

## 2011-01-22 MED ORDER — GLYCOPYRROLATE 0.2 MG/ML IJ SOLN
INTRAMUSCULAR | Status: AC
  Administered 2011-01-22: 0.2 mg via INTRAVENOUS
  Filled 2011-01-22: qty 1

## 2011-01-22 MED ORDER — ONDANSETRON HCL 4 MG/2ML IJ SOLN
INTRAMUSCULAR | Status: AC
  Filled 2011-01-22: qty 2

## 2011-01-22 MED ORDER — FENTANYL CITRATE 0.05 MG/ML IJ SOLN
INTRAMUSCULAR | Status: DC | PRN
  Administered 2011-01-22: 50 ug via INTRAVENOUS

## 2011-01-22 MED ORDER — FENTANYL CITRATE 0.05 MG/ML IJ SOLN
INTRAMUSCULAR | Status: AC
  Filled 2011-01-22: qty 2

## 2011-01-22 MED ORDER — ONDANSETRON HCL 4 MG/2ML IJ SOLN
INTRAMUSCULAR | Status: AC
  Administered 2011-01-22: 4 mg via INTRAVENOUS
  Filled 2011-01-22: qty 2

## 2011-01-22 SURGICAL SUPPLY — 16 items
BLOCK BITE 60FR ADLT L/F BLUE (MISCELLANEOUS) ×2 IMPLANT
ELECT REM PT RETURN 9FT ADLT (ELECTROSURGICAL)
ELECTRODE REM PT RTRN 9FT ADLT (ELECTROSURGICAL) IMPLANT
FLOOR PAD 36X40 (MISCELLANEOUS) ×2
FORCEP RJ3 GP 1.8X160 W-NEEDLE (CUTTING FORCEPS) IMPLANT
FORCEPS BIOP RAD 4 LRG CAP 4 (CUTTING FORCEPS) ×2 IMPLANT
NEEDLE SCLEROTHERAPY 25GX240 (NEEDLE) IMPLANT
PAD FLOOR 36X40 (MISCELLANEOUS) ×1 IMPLANT
PROBE APC STR FIRE (PROBE) IMPLANT
PROBE INJECTION GOLD (MISCELLANEOUS)
PROBE INJECTION GOLD 7FR (MISCELLANEOUS) IMPLANT
SNARE SHORT THROW 13M SML OVAL (MISCELLANEOUS) IMPLANT
SYR 50ML LL SCALE MARK (SYRINGE) ×2 IMPLANT
TUBING ENDO SMARTCAP PENTAX (MISCELLANEOUS) ×4 IMPLANT
TUBING IRRIGATION ENDOGATOR (MISCELLANEOUS) ×2 IMPLANT
WATER STERILE IRR 1000ML POUR (IV SOLUTION) ×2 IMPLANT

## 2011-01-22 NOTE — Anesthesia Preprocedure Evaluation (Addendum)
Anesthesia Evaluation  Name, MR# and DOB Patient awake  General Assessment Comment  Reviewed: Allergy & Precautions, H&P , NPO status , Patient's Chart, lab work & pertinent test results  History of Anesthesia Complications Negative for: history of anesthetic complications  Airway Mallampati: II TM Distance: >3 FB     Dental  (+) Teeth Intact   Pulmonary (+) shortness of breath and With exertion COPD COPD inhaler    + decreased breath sounds      Cardiovascular hypertension, Pt. on medications Regular Normal    Neuro/Psych PSYCHIATRIC DISORDERS Anxiety Depression    GI/Hepatic GERD Medicated and Poorly Controlled  Endo/Other    Renal/GU      Musculoskeletal   Abdominal   Peds  Hematology   Anesthesia Other Findings   Reproductive/Obstetrics                           Anesthesia Physical Anesthesia Plan  ASA: III  Anesthesia Plan: MAC   Post-op Pain Management:    Induction: Intravenous  Airway Management Planned: Simple Face Mask  Additional Equipment:   Intra-op Plan:   Post-operative Plan:   Informed Consent: I have reviewed the patients History and Physical, chart, labs and discussed the procedure including the risks, benefits and alternatives for the proposed anesthesia with the patient or authorized representative who has indicated his/her understanding and acceptance.     Plan Discussed with:   Anesthesia Plan Comments: (preop albuterol jet neb)        Anesthesia Quick Evaluation

## 2011-01-22 NOTE — Transfer of Care (Signed)
Immediate Anesthesia Transfer of Care Note  Patient: [name]  Procedure(s) Performed:  ESOPHAGOGASTRODUODENOSCOPY (EGD) WITH PROPOFOL  Patient Location: PACU  Anesthesia Type: MAC  Level of Consciousness: awake, oriented and patient cooperative  Airway & Oxygen Therapy: Patient Spontanous Breathing  Post-op Assessment: Report given to PACU RN, Post -op Vital signs reviewed and stable and Patient moving all extremities X 4  Post vital signs: Reviewed and stable  Complications: No apparent anesthesia complications

## 2011-01-22 NOTE — Consult Note (Signed)
REVIEWED. AGREE. 

## 2011-01-22 NOTE — Anesthesia Postprocedure Evaluation (Signed)
  Anesthesia Post-op Note  Patient: [name]  Procedure(s) Performed:  ESOPHAGOGASTRODUODENOSCOPY (EGD) WITH PROPOFOL  Patient Location: PACU  Anesthesia Type: MAC  Level of Consciousness: awake, alert , oriented and patient cooperative  Airway and Oxygen Therapy: Patient Spontanous Breathing  Post-op Pain: none  Post-op Assessment: Post-op Vital signs reviewed, Patient's Cardiovascular Status Stable, Respiratory Function Stable, Patent Airway and No signs of Nausea or vomiting  Post-op Vital Signs: Reviewed and stable  Complications: No apparent anesthesia complications

## 2011-01-22 NOTE — Progress Notes (Signed)
Subjective: Patient continues to complain abdominal pain. However please note the patient is eating her dinner without any difficulty and when observed without her knowledge  the patient exhibits no sign of pain.  The patient offers that she has been under the care of Dr. Genelle Gather at [facility] her gastroenterologist, and has a known granulomatous esophageal mass. The patient states that she has usually has yearly endoscopies for surveillance of the esophageal mass. The patient states that she has gained 30 pounds in the last 2 months. She states that she's had bloating in the past however in the past and this seems to have worsened. Objective: Filed Vitals:   01/22/11 1419 01/22/11 1423 01/22/11 1450 01/22/11 1607  BP: 151/90  131/85   Pulse: 90  83 85  Temp: 98.2 F (36.8 C)  97.9 F (36.6 C)   TempSrc:      Resp: 20  16 18   Height:      Weight:      SpO2: 98% 98% 97% 96%   Weight change:   Intake/Output Summary (Last 24 hours) at 01/22/11 1732 Last data filed at 01/22/11 1355  Gross per 24 hour  Intake    750 ml  Output    350 ml  Net    400 ml    General: Alert, awake, oriented x3, in no acute distress.  HEENT: [place]/AT [place]ER[place], EOMI Neck: Trachea midline,  no masses, no thyromegal,y no JVD, no carotid bruit OROPHARYNX:  Moist, No exudate/ erythema/lesions.  Heart: Regular rate and rhythm, without murmurs, rubs, gallops, PMI non-displaced, no heaves or thrills on palpation.  Lungs: Clear to auscultation, no wheezing or rhonchi noted. No increased vocal fremitus resonant to percussion  Abdomen: Soft, nontender, nondistended, positive bowel sounds, no masses no hepatosplenomegaly noted..  Neuro: No focal neurological deficits noted cranial nerves II through XII grossly intact. DTRs 2+ bilaterally upper and lower extremities. Strength 5 out of 5 in bilateral upper and lower extremities. Musculoskeletal: No warm swelling or erythema around joints, no spinal tenderness  noted. Psychiatric: Patient alert and oriented x3, good insight and cognition, good recent to remote recall. Lymph node survey: No cervical axillary or inguinal lymphadenopathy noted.     Lab Results:  Basename 01/22/11 0500 01/21/11 0503  NA 140 142  K 3.6 3.1*  CL 111 112  CO2 22 20  GLUCOSE 78 76  BUN 8 12  CREATININE 0.99 1.07  CALCIUM 8.1* 7.8*  MG -- 2.2  PHOS -- --   No results found for this basename: AST:2,ALT:2,ALKPHOS:2,BILITOT:2,PROT:2,ALBUMIN:2 in the last 72 hours  Basename 01/22/11 0500 01/21/11 0503  LIPASE 31 35  AMYLASE -- --    Basename 01/22/11 0500 01/21/11 0503  WBC 9.6 12.5*  NEUTROABS 6.0 8.4*  HGB 10.2* 10.8*  HCT 30.9* 32.5*  MCV 90.9 91.3  PLT 250 266   No results found for this basename: CKTOTAL:3,CKMB:3,CKMBINDEX:3,TROPONINI:3 in the last 72 hours No results found for this basename: POCBNP:3 in the last 72 hours No results found for this basename: DDIMER:2 in the last 72 hours No results found for this basename: HGBA1C:2 in the last 72 hours No results found for this basename: CHOL:2,HDL:2,LDLCALC:2,TRIG:2,CHOLHDL:2,LDLDIRECT:2 in the last 72 hours No results found for this basename: TSH,T4TOTAL,FREET3,T3FREE,THYROIDAB in the last 72 hours No results found for this basename: VITAMINB12:2,FOLATE:2,FERRITIN:2,TIBC:2,IRON:2,RETICCTPCT:2 in the last 72 hours  Micro Results: No results found for this or any previous visit (from the past 240 hour(s)).  Studies/Results: Ct Abdomen Pelvis Wo Contrast  01/19/2011  *  RADIOLOGY REPORT*  Clinical Data: Abdominal pain, bloating, nausea/vomiting, status post cholecystectomy  CT ABDOMEN AND PELVIS WITHOUT CONTRAST  Technique:  Multidetector CT imaging of the abdomen and pelvis was performed following the standard protocol without intravenous contrast.  Comparison: None.  Findings: Lung bases are essentially clear.  Unenhanced liver, spleen, pancreas, and adrenal glands within normal limits.  Status post  cholecystectomy.  No intrahepatic or extrahepatic ductal dilatation.  Horseshoe kidney. Left extrarenal pelvis.  No renal calculi or hydronephrosis.  No evidence of bowel obstruction.  Normal appendix.  Atherosclerotic calcifications of the abdominal aorta and branch vessels.  No abdominopelvic ascites.  No suspicious abdominopelvic lymphadenopathy.  Uterus and bilateral ovaries are unremarkable.  Bladder is within normal limits.  Visualized osseous structures are within normal limits.  IMPRESSION: Normal appendix.  No evidence of bowel obstruction.  Horseshoe kidney.  No renal calculi or hydronephrosis.  No CT findings to account for the patient's abdominal complaints.  Original Report Authenticated By: Charline Bills, M.D.   Dg Chest 2 View  01/18/2011  *RADIOLOGY REPORT*  Clinical Data: Shortness of breath.  CHEST - 2 VIEW  Comparison: Chest x-ray 06/02/2010.  Findings: The cardiac silhouette, mediastinal and hilar contours are within normal limits and stable. The lungs are clear.  No pleural effusions.  The bony thorax is intact.  IMPRESSION: Normal chest x-ray.  No change.  Original Report Authenticated By: P. Loralie Champagne, M.D.   US Abdomen Complete  01/20/2011  *RADIOLOGY REPORT*  Clinical Data:  Abdominal pain, question biliary pancreatitis  ULTRASOUND ABDOMEN:  Technique:  Sonography of upper abdominal structures was performed.  Comparison:  None Correlation:  CT abdomen 01/19/2011  Gallbladder:  Surgically absent  Common bile duct:  Normal post cholecystectomy caliber of 6 mm diameter.  Liver:  Normal appearance  IVC:  Normal appearance  Pancreas:  Incomplete visualization of distal pancreatic tail due to obscuration by bowel gas.  Visualized portions of pancreas normal appearance.  No peripancreatic fluid collections.  Spleen:  Normal appearance, 8.0 cm length  Right kidney:  9.0 cm length. 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  Poor visualization of the inferior pole. Visualized  portions normal appearance.  Left kidney:  9.1 cm length. 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  Poor visualization of the inferior pole. Visualized portions normal appearance.  Aorta:  Predominately obscured by bowel gas, suboptimally assessed.  Other:  No free fluid  IMPRESSION: Horseshoe kidney. Incomplete visualization of distal pancreatic tail and aorta due to bowel gas and the inferior poles of the kidneys due to horseshoe morphology. Post cholecystectomy. No acute abnormalities.  Original Report Authenticated By: Lollie Marrow, M.D.    Medications: I have reviewed the patient's current medications. Scheduled Meds:   . albuterol  2.5 mg Nebulization Once  . buPROPion  150 mg Oral TID  . fentaNYL      . fluticasone-salmeterol  2 puff Inhalation BID  . glycopyrrolate  0.2 mg Intravenous Once  . heparin  5,000 Units Subcutaneous Q8H  . hydrocortisone  1 application Rectal BID  . methylPREDNISolone sodium succinate  60 mg Intravenous Q24H  . midazolam      . midazolam      . ondansetron      . ondansetron (ZOFRAN) IV  4 mg Intravenous Once  . pantoprazole  40 mg Oral BID AC  . polyvinyl alcohol  1 drop Both Eyes TID  . propofol      . sodium chloride      .  sodium chloride      . DISCONTD: albuterol  2.5 mg Nebulization TID   Continuous Infusions:   . sodium chloride 200 mL/hr at 01/22/11 0854   PRN Meds:.acetaminophen, acetaminophen, albuterol, albuterol, chlorpheniramine-HYDROcodone, LORazepam, morphine, ondansetron (ZOFRAN) IV, ondansetron (ZOFRAN) IV, ondansetron, promethazine, simethicone, simethicone susp in sterile water 1000 mL irrigation, DISCONTD: fentaNYL, DISCONTD: midazolam Assessment/Plan: Patient Active Hospital Problem List: Pancreatitis (01/19/2011)   Assessment: The patient had only mild elevation of her lipase enzyme on admission. I'm unsure if this actually represents a pancreatitis however but I suspect that the patient's pain is more likely related to  gastritis nevertheless the patient is tolerating diet without any difficulty.    Plan: Plan is to continue pain management in this patient with oral pain medications.  Abdominal pain, acute, epigastric (01/18/2011)   Assessment: Based on her endoscopic findings the patient has mild gastritis.    Plan: The patient is presently on protonic twice daily. We'll go ahead and and Carafate 1 g by mouth 4 times a day a.c. Meals. Dyspnea (01/18/2011)   Assessment: Presently the patient exhibits no signs of dyspnea    Plan: The patient had been on tapering dose of steroids I will discontinue Solu-Medrol after today's dose. Chest pain (01/18/2011)   Assessment: Patient has no complaints of chest pain at this time  Nausea & vomiting (01/18/2011)   Assessment: Presently the patient has no nausea or vomiting she is tolerating her diet well  COPD (chronic obstructive pulmonary disease) (01/18/2011)   Assessment: COPD as well compensated at this time.    Plan: Solu-Medrol will be discontinued after today's dose  Anxiety and depression (01/18/2011)   Assessment: Noted stable  Hyponatremia (01/19/2011)   Assessment: Resolved  Hypokalemia (01/21/2011)   Assessment: Resolved  DVT prophylaxis: Lovenox subcutaneously every 24 hours    LOS: 4 days

## 2011-01-23 ENCOUNTER — Encounter: Payer: Self-pay | Admitting: Gastroenterology

## 2011-01-23 DIAGNOSIS — R109 Unspecified abdominal pain: Secondary | ICD-10-CM

## 2011-01-23 DIAGNOSIS — R112 Nausea with vomiting, unspecified: Secondary | ICD-10-CM

## 2011-01-23 LAB — BASIC METABOLIC PANEL
CO2: 23 mEq/L (ref 19–32)
Calcium: 8.1 mg/dL — ABNORMAL LOW (ref 8.4–10.5)
Creatinine, Ser: 1.04 mg/dL (ref 0.50–1.10)
Glucose, Bld: 100 mg/dL — ABNORMAL HIGH (ref 70–99)

## 2011-01-23 LAB — DIFFERENTIAL
Eosinophils Relative: 0 % (ref 0–5)
Lymphocytes Relative: 15 % (ref 12–46)
Lymphs Abs: 1.5 10*3/uL (ref 0.7–4.0)
Monocytes Absolute: 0.5 10*3/uL (ref 0.1–1.0)

## 2011-01-23 LAB — CBC
HCT: 31.4 % — ABNORMAL LOW (ref 36.0–46.0)
Hemoglobin: 10.3 g/dL — ABNORMAL LOW (ref 12.0–15.0)
MCV: 90.2 fL (ref 78.0–100.0)
RBC: 3.48 MIL/uL — ABNORMAL LOW (ref 3.87–5.11)
WBC: 9.6 10*3/uL (ref 4.0–10.5)

## 2011-01-23 LAB — HEPATIC FUNCTION PANEL
ALT: 14 U/L (ref 0–35)
Alkaline Phosphatase: 70 U/L (ref 39–117)
Bilirubin, Direct: <0.1 mg/dL (ref 0.0–0.3)

## 2011-01-23 MED ORDER — POLYETHYLENE GLYCOL 3350 17 G PO PACK
17.0000 g | PACK | Freq: Every day | ORAL | Status: DC
  Administered 2011-01-23: 17 g via ORAL
  Filled 2011-01-23: qty 1

## 2011-01-23 MED ORDER — SODIUM CHLORIDE 0.9 % IN NEBU
INHALATION_SOLUTION | RESPIRATORY_TRACT | Status: AC
  Administered 2011-01-23: 3 mL
  Filled 2011-01-23: qty 3

## 2011-01-23 MED ORDER — SUCRALFATE 1 GM/10ML PO SUSP: 1.0000 g | Freq: Three times a day (TID) | ORAL | Status: DC

## 2011-01-23 MED ORDER — FLORA-Q PO CAPS: 1.0000 | ORAL_CAPSULE | Freq: Every day | ORAL | Status: DC

## 2011-01-23 MED ORDER — SODIUM CHLORIDE 0.9 % IJ SOLN
INTRAMUSCULAR | Status: AC
  Administered 2011-01-23: 10 mL
  Filled 2011-01-23: qty 10

## 2011-01-23 MED ORDER — PANTOPRAZOLE SODIUM 40 MG PO TBEC: 40.0000 mg | DELAYED_RELEASE_TABLET | Freq: Two times a day (BID) | ORAL | Status: DC

## 2011-01-23 MED ORDER — FLORA-Q PO CAPS
1.0000 | ORAL_CAPSULE | Freq: Every day | ORAL | Status: DC
  Administered 2011-01-23: 1 via ORAL
  Filled 2011-01-23: qty 1

## 2011-01-23 NOTE — Progress Notes (Signed)
Subjective: [name] is a 50 y.o. female admitted with epigastric pain, minimally elevated lipase. EGD showed mild inflammation of the stomach. Biopsies pending. Known esophageal mass, benign per patient. Ate 75% of meal last night. Patient states initially she fried chicken pieces and barbeque sauce but she couldn't eat it so she traded for soup. C/O vomiting during night. No BM for 7 days.  Objective: Vital signs in last 24 hours: Temp:  [97.6 F (36.4 C)-98.3 F (36.8 C)] 97.6 F (36.4 C) (10/04 0700) Pulse Rate:  [74-99] 74  (10/04 0700) Resp:  [10-20] 20  (10/04 0700) BP: (115-161)/(62-93) 121/78 mmHg (10/04 0700) SpO2:  [96 %-100 %] 97 % (10/04 0700) Weight:  [150 lb (68.04 kg)] 150 lb (68.04 kg) (10/03 1204) Last BM Date: 12/16/10 General:   Alert,  Well-developed, well-nourished, pleasant and cooperative in NAD. Just woke up. Head:  Normocephalic and atraumatic. Eyes:  Sclera clear, no icterus.  Chest: CTA bilaterally without rales, rhonchi, crackles.    Heart:  Regular rate and rhythm; no murmurs, clicks, rubs,  or gallops. Abdomen:  Soft, moderate epigastric tenderness and nondistended. No masses, hepatosplenomegaly or hernias noted. Normal bowel sounds, without guarding, and without rebound.   Extremities:  Without clubbing, deformity or edema. Neurologic:  Alert and  oriented x4;  grossly normal neurologically. Skin:  Intact without significant lesions or rashes. Psych:  Alert and cooperative. Normal mood and affect.  Intake/Output from previous day: 10/03 0701 - 10/04 0700 In: 4420 [P.O.:720; I.V.:3700] Out: 2000 [Urine:2000] Intake/Output this shift:    Lab Results: CBC  Basename 01/23/11 0453 01/22/11 0500 01/21/11 0503  WBC 9.6 9.6 12.5*  HGB 10.3* 10.2* 10.8*  HCT 31.4* 30.9* 32.5*  MCV 90.2 90.9 91.3  PLT 274 250 266   BMET  Basename 01/23/11 0453 01/22/11 0500 01/21/11 0503  NA 142 140 142  K 4.1 3.6 3.1*  CL 113* 111 112  CO2 23 22 20   GLUCOSE  100* 78 76  BUN 8 8 12   CREATININE 1.04 0.99 1.07  CALCIUM 8.1* 8.1* 7.8*   LFTs Lab Results  Component Value Date   ALT 10 01/19/2011   AST 12 01/19/2011   ALKPHOS 81 01/19/2011   BILITOT 0.4 01/19/2011     Basename 01/23/11 0453 01/22/11 0500 01/21/11 0503  LIPASE 29 31 35    Assessment:   50 y/o with epigastric pain, sob, n/v. Mild inflammation of stomach on EGD. Known benign esophageal mass s/p biopsy, usually followed at [facility]. Suspected mild gastritis due to steroids. No erosions or ulcers. Dyspepsia secondary to antibiotics. Minimally elevated lipase in setting of elevated Creatinine, likely insignificant. Pancreas okay on CT. LFTs okay on admission.  Plan: #1 BID PPI #2 Taper steroids as soon as possible #3 Await biopsy #4 Add probiotics #5 Miralax #6 add on LFTs #7 await records  LOS: 5 days   Tana Coast  01/23/2011, 7:57 AM

## 2011-01-23 NOTE — Progress Notes (Signed)
REVIEWED. AGREE. 

## 2011-01-23 NOTE — Discharge Summary (Signed)
[name] MRN: [id number] DOB/AGE: 1961-02-19 50 y.o.  Admit date: 01/18/2011 Discharge date: 01/23/2011  Primary Care Physician:  Colette Ribas, MD   Discharge Diagnoses:   Patient Active Problem List  Diagnoses  . HYPERTENSION  . Abdominal pain, acute, epigastric  . Dyspnea  . Nausea & vomiting  . COPD (chronic obstructive pulmonary disease)  . Anxiety and depression  . Hyponatremia  . Hypokalemia    DISCHARGE MEDICATION: Current Discharge Medication List    START taking these medications   Details  Flora-Q (FLORA-Q) CAPS Take 1 capsule by mouth daily. Qty: 30 capsule, Refills: 0   Associated Diagnoses: Anxiety; Shortness of breath; Abdominal pain, acute, epigastric; Dyspnea; Chest pain; Nausea & vomiting; COPD (chronic obstructive pulmonary disease); Anxiety and depression; Pancreatitis; Hyponatremia; Hypokalemia; Odynophagia; Unspecified essential hypertension    pantoprazole (PROTONIX) 40 MG tablet Take 1 tablet (40 mg total) by mouth 2 (two) times daily before a meal. Qty: 60 tablet, Refills: 0    sucralfate (CARAFATE) 1 GM/10ML suspension Take 10 mLs (1 g total) by mouth 4 (four) times daily -  with meals and at bedtime. Qty: 900 mL, Refills: 0      CONTINUE these medications which have NOT CHANGED   Details  albuterol (PROVENTIL HFA;VENTOLIN HFA) 108 (90 BASE) MCG/ACT inhaler Inhale 2 puffs into the lungs every 6 (six) hours as needed. For asthma      albuterol (PROVENTIL) (2.5 MG/3ML) 0.083% nebulizer solution Take 2.5 mg by nebulization every 6 (six) hours as needed. For asthma     buPROPion (WELLBUTRIN SR) 150 MG 12 hr tablet Take 150 mg by mouth 3 (three) times daily.      cetirizine (ZYRTEC) 10 MG tablet Take 10 mg by mouth daily as needed. For allergies     fluticasone (FLONASE) 50 MCG/ACT nasal spray Place 2 sprays into the nose daily.      fluticasone-salmeterol (ADVAIR HFA) 115-21 MCG/ACT inhaler Inhale 1 puff into the lungs 2 (two) times daily.       hydrocortisone (ANUSOL-HC) 2.5 % rectal cream Place 1 application rectally 2 (two) times daily. For hemorrhoids     ipratropium (ATROVENT) 0.02 % nebulizer solution Take 500 mcg by nebulization 4 (four) times daily as needed. For asthma     levalbuterol (XOPENEX) 0.63 MG/3ML nebulizer solution Take 1 ampule by nebulization every 4 (four) hours as needed. For asthma      levalbuterol (XOPENEX) 1.25 MG/3ML nebulizer solution Take 1 ampule by nebulization every 4 (four) hours as needed. For asthma     LORazepam (ATIVAN) 1 MG tablet Take 1 mg by mouth every 8 (eight) hours.      losartan (COZAAR) 100 MG tablet Take 100 mg by mouth daily.      Multiple Vitamins-Calcium (ONE-A-DAY WOMENS PO) Take 1 tablet by mouth daily.      ondansetron (ZOFRAN) 4 MG tablet Take 4 mg by mouth every 8 (eight) hours as needed. For nausea     oxyCODONE (OXY IR/ROXICODONE) 5 MG immediate release tablet Take 5-10 mg by mouth every 6 (six) hours as needed. For pain     triamterene-hydrochlorothiazide (MAXZIDE) 75-50 MG per tablet Take 1 tablet by mouth daily.        STOP taking these medications     esomeprazole (NEXIUM) 40 MG capsule      hydrochlorothiazide (HYDRODIURIL) 50 MG tablet      moxifloxacin (AVELOX) 400 MG tablet      PREDNISONE, PAK, PO  Consults: Treatment Team:  Arlyce Harman, MD   SIGNIFICANT DIAGNOSTIC STUDIES:  Ct Abdomen Pelvis Wo Contrast  01/19/2011  *RADIOLOGY REPORT*  Clinical Data: Abdominal pain, bloating, nausea/vomiting, status post cholecystectomy  CT ABDOMEN AND PELVIS WITHOUT CONTRAST  Technique:  Multidetector CT imaging of the abdomen and pelvis was performed following the standard protocol without intravenous contrast.  Comparison: None.  Findings: Lung bases are essentially clear.  Unenhanced liver, spleen, pancreas, and adrenal glands within normal limits.  Status post cholecystectomy.  No intrahepatic or extrahepatic ductal dilatation.  Horseshoe  kidney. Left extrarenal pelvis.  No renal calculi or hydronephrosis.  No evidence of bowel obstruction.  Normal appendix.  Atherosclerotic calcifications of the abdominal aorta and branch vessels.  No abdominopelvic ascites.  No suspicious abdominopelvic lymphadenopathy.  Uterus and bilateral ovaries are unremarkable.  Bladder is within normal limits.  Visualized osseous structures are within normal limits.  IMPRESSION: Normal appendix.  No evidence of bowel obstruction.  Horseshoe kidney.  No renal calculi or hydronephrosis.  No CT findings to account for the patient's abdominal complaints.  Original Report Authenticated By: Charline Bills, M.D.   Dg Chest 2 View  01/18/2011  *RADIOLOGY REPORT*  Clinical Data: Shortness of breath.  CHEST - 2 VIEW  Comparison: Chest x-ray 06/02/2010.  Findings: The cardiac silhouette, mediastinal and hilar contours are within normal limits and stable. The lungs are clear.  No pleural effusions.  The bony thorax is intact.  IMPRESSION: Normal chest x-ray.  No change.  Original Report Authenticated By: P. Loralie Champagne, M.D.   US Abdomen Complete  01/20/2011  *RADIOLOGY REPORT*  Clinical Data:  Abdominal pain, question biliary pancreatitis  ULTRASOUND ABDOMEN:  Technique:  Sonography of upper abdominal structures was performed.  Comparison:  None Correlation:  CT abdomen 01/19/2011  Gallbladder:  Surgically absent  Common bile duct:  Normal post cholecystectomy caliber of 6 mm diameter.  Liver:  Normal appearance  IVC:  Normal appearance  Pancreas:  Incomplete visualization of distal pancreatic tail due to obscuration by bowel gas.  Visualized portions of pancreas normal appearance.  No peripancreatic fluid collections.  Spleen:  Normal appearance, 8.0 cm length  Right kidney:  9.0 cm length. 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  Poor visualization of the inferior pole. Visualized portions normal appearance.  Left kidney:  9.1 cm length. Horseshoe morphology,  better appreciated on CT.  Poor visualization of the inferior pole. Visualized portions normal appearance.  Aorta:  Predominately obscured by bowel gas, suboptimally assessed.  Other:  No free fluid  IMPRESSION: Horseshoe kidney. Incomplete visualization of distal pancreatic tail and aorta due to bowel gas and the inferior poles of the kidneys due to horseshoe morphology. Post cholecystectomy. No acute abnormalities.  Original Report Authenticated By: Lollie Marrow, M.D.  CARDIAC CATH & OTHER PROCEDURES: Patient had EGD performed that showed mild gastritis without erosions or ulcerations. 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 There is normal duodenum.  No results found for this or any previous visit (from the past 240 hour(s)).  BRIEF ADMITTING H & P: HPI: This is a 50 year old, Caucasian female, with a history of COPD, hypertension, who tells me that she was in her usual state of health about 3 weeks ago, when she started with a sinus infection. She went to her primary care provider, and was prescribed Z-Pak. Patient did not feel any better and she went back after few days and was diagnosed with bronchitis. She was  given another prescription for Z-Pak. And, then 10 days ago, she went for a recheck and felt that she was worse and so, she was prescribed Avelox for 10 days. She finished that course today. She was also started on prednisone yesterday.  She tells me that the shortness of breath is with rest. When she talks she gets extremely short of breath. She feels very tired and exhausted. She admits to some leg swelling once in a while. She's been having abdominal pains for a week ago, and got worse on Friday. This has been associated with nausea, vomiting. The pain started in the upper abdomen in the epigastric area. It's about 8/10 in intensity. No radiation of the pain. She feels like she is bloated. Denies any blood in the emesis. Denies any  black colored stools. Her last bowel movement was 2 days ago, which was normal. She tells me she's been constipated for the past many months. She denies any weight loss. However, is concerned about the excessive weight gain of about 24 pounds in the last few months. She also has heartburn, and has been having a pressure-like sensation in her upper chest for the last few weeks. She's had a dry cough. Denies any history of heart disease. Because her symptoms not improving she decided to come in to the emergency department for further evaluation.     Hospital Course:  Present on Admission:  .Abdominal pain, acute, epigastric: The patient continues to have epigastric pain. She underwent an endoscopy with findings as noted above. The patient apparently has a history of a granulomatous mass within the esophagus which has been followed by [facility] with yearly endoscopies.the patient however did not share this information initially but the primary admitting team. I discussed this with Dr. Andrey Campanile feels gastroenterologist who performed the patient's ABG. She agrees with the plan of treatment for twice a day protonic as well as Carafate for approximately one month. During the endoscopy she performed a biopsy of the mass is well as biopsy to test for H. Pylori. She says that the patient follow up with her in 2-3 weeks for followup of the biopsy and H. Pylori. The patient at this time has no restrictions on diet texture as it pertains to her gastritis appear the patient has been given instructions on substances that she should avoid in light of her gastritis.  Marland KitchenDyspnea. The patient is a tobacco user and has known COPD. The patient had started on outpatient treatment for exacerbation of COPD with a Medrol Dosepak. The steroid taper was continued while hospitalized with IV Solu-Medrol as the patient was complaining of difficulty with oral intake. The patient has been tapered off completely from her steroids she has  been cautioned to avoid steroid use in light of her gastritis.  .Chest pain: The patient had no appreciable chest pain during his hospitalization it appears that her characterization of chest pain with an actual l representation of the epigastric pain that she was experiencing. No further interventions during this hospitalization.  .Nausea & vomiting: Nausea and vomiting has resolved and the patient is able to tolerate diet without any difficulty .  Marland KitchenCOPD (chronic obstructive pulmonary disease): See above under dyspnea  .Anxiety and depression: The patient has chronic anxiety and depression and is on and chronically she will continue.  .Pancreatitis: The patient had only a very mild elevation of her lipase which was attributed to likely dehydration. There is no further intervention and the patient is tolerating diet without difficulty.  Disposition and Follow-up: Patient to followup with Dr. Andrey Campanile feels in 2-3 weeks the phone number has been provided for the patient. Patient also to followup with her primary care physician in 3-5 days or as needed Discharge Orders    Future Orders Please Complete By Expires   Diet - low sodium heart healthy         DISCHARGE EXAM: General: Alert, awake, oriented x3, in no acute distress.  HEENT: [place]/AT PEERL, EOMI  Neck: Trachea midline, no masses, no thyromegal,y no JVD, no carotid bruit  OROPHARYNX: Moist, No exudate/ erythema/lesions.  Heart: Regular rate and rhythm, without murmurs, rubs, gallops, PMI non-displaced, no heaves or thrills on palpation.  Lungs: Clear to auscultation, no wheezing or rhonchi noted. No increased vocal fremitus resonant to percussion  Abdomen: Soft, mild diffuse tenderness nondistended, positive bowel sounds, no masses no hepatosplenomegaly noted..  Neuro: No focal neurological deficits noted cranial nerves II through XII grossly intact. DTRs 2+ bilaterally upper and lower extremities. Strength 5 out of 5 in bilateral upper and  lower extremities.  Musculoskeletal: No warm swelling or erythema around joints, no spinal tenderness noted.  Psychiatric: Patient alert and oriented x3, good insight and cognition, good recent to remote recall.  Lymph node survey: No cervical axillary or inguinal lymphadenopathy noted.      Blood pressure 121/78, pulse 74, temperature 97.6 F (36.4 C), temperature source Oral, resp. rate 20, height 5\' 3"  (1.6 m), weight 68.04 kg (150 lb), last menstrual period 12/04/2010, SpO2 98.00%.   Basename 01/23/11 0453 01/22/11 0500 01/21/11 0503  NA 142 140 --  K 4.1 3.6 --  CL 113* 111 --  CO2 23 22 --  GLUCOSE 100* 78 --  BUN 8 8 --  CREATININE 1.04 0.99 --  CALCIUM 8.1* 8.1* --  MG -- -- 2.2  PHOS -- -- --    Basename 01/23/11 0453  AST 11  ALT 14  ALKPHOS 70  BILITOT 0.1*  PROT 5.4*  ALBUMIN 2.7*    Basename 01/23/11 0453 01/22/11 0500  LIPASE 29 31  AMYLASE -- --    Basename 01/23/11 0453 01/22/11 0500  WBC 9.6 9.6  NEUTROABS 7.6 6.0  HGB 10.3* 10.2*  HCT 31.4* 30.9*  MCV 90.2 90.9  PLT 274 250    Signed: MATTHEWS,MICHELLE A. 01/23/2011, 10:30 AM

## 2011-01-23 NOTE — Progress Notes (Signed)
States understanding of discharge,iv site d/ced wnl

## 2011-01-27 ENCOUNTER — Other Ambulatory Visit (HOSPITAL_COMMUNITY): Payer: Self-pay | Admitting: Physician Assistant

## 2011-01-27 DIAGNOSIS — Z139 Encounter for screening, unspecified: Secondary | ICD-10-CM

## 2011-01-27 DIAGNOSIS — S02402A Zygomatic fracture, unspecified, initial encounter for closed fracture: Secondary | ICD-10-CM | POA: Insufficient documentation

## 2011-01-28 ENCOUNTER — Telehealth: Payer: Self-pay | Admitting: Gastroenterology

## 2011-01-28 NOTE — Telephone Encounter (Signed)
Pt is aware of OV on 10/18 at 0900 with AS

## 2011-01-28 NOTE — Telephone Encounter (Signed)
Please call pt. 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 His stomach Bx shows mild gastritis. Continue PROTONIX 30 minutes prior to meals TWICE DAILY. OPV IN 1-2 MOS W/ SLF E: 30 VISIT. OBTAIN RECORDS FROM [facility] FROM PAST 5 YEARS.

## 2011-01-29 NOTE — Telephone Encounter (Signed)
Pt informed. She said to fax the records release paper needed to her at her home office @ [phone]. Also, she has a notary  In her office if it needs to be notarized.

## 2011-01-30 NOTE — Telephone Encounter (Signed)
Results Cc to PCP  

## 2011-02-04 ENCOUNTER — Ambulatory Visit (HOSPITAL_COMMUNITY): Payer: BC Managed Care – PPO

## 2011-02-06 ENCOUNTER — Ambulatory Visit (HOSPITAL_COMMUNITY): Payer: BC Managed Care – PPO

## 2011-02-06 ENCOUNTER — Ambulatory Visit: Payer: BC Managed Care – PPO | Admitting: Gastroenterology

## 2011-02-11 ENCOUNTER — Ambulatory Visit (HOSPITAL_COMMUNITY)
Admission: RE | Admit: 2011-02-11 | Discharge: 2011-02-11 | Disposition: A | Discharge status: Home / Self Care | Payer: BC Managed Care – PPO | Source: Ambulatory Visit | Attending: Physician Assistant | Admitting: Physician Assistant

## 2011-02-11 ENCOUNTER — Encounter: Payer: Self-pay | Admitting: Gastroenterology

## 2011-02-11 ENCOUNTER — Ambulatory Visit (INDEPENDENT_AMBULATORY_CARE_PROVIDER_SITE_OTHER): Payer: BC Managed Care – PPO | Admitting: Gastroenterology

## 2011-02-11 VITALS — BP 120/73 | HR 98 | Temp 98.4°F | Ht 63.0 in | Wt 145.6 lb

## 2011-02-11 DIAGNOSIS — Z1231 Encounter for screening mammogram for malignant neoplasm of breast: Secondary | ICD-10-CM | POA: Insufficient documentation

## 2011-02-11 DIAGNOSIS — K2970 Gastritis, unspecified, without bleeding: Secondary | ICD-10-CM

## 2011-02-11 DIAGNOSIS — K439 Ventral hernia without obstruction or gangrene: Secondary | ICD-10-CM | POA: Insufficient documentation

## 2011-02-11 DIAGNOSIS — Z139 Encounter for screening, unspecified: Secondary | ICD-10-CM

## 2011-02-11 MED ORDER — PANTOPRAZOLE SODIUM 40 MG PO TBEC: 40.0000 mg | DELAYED_RELEASE_TABLET | Freq: Two times a day (BID) | ORAL | Status: DC

## 2011-02-11 NOTE — Assessment & Plan Note (Signed)
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 Doing well, s/p EGD in Oct 2012 with no new significant findings. Protonix BID, without dysphagia/odynophagia, N/V. Intermittent reflux depending on food choices. Continue BID Protonix, f/u in 6 mos with Dr. Darrick Penna.
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
## 2011-02-11 NOTE — Progress Notes (Signed)
Cc to PCP 

## 2011-02-11 NOTE — Assessment & Plan Note (Signed)
Small ventral hernia easily reducible, noted at prior lap site epigastric region, from previous cholecystectomy. Intermittent tenderness per pt, especially with strenuous activity, bending over. She would like to hold off on any elective surgical referral. Discussed diet/exercise to decrease abdominal obesity. She used to weigh around 120 prior to rounds of steroids. Now 145. Reportedly losing weight. Encouraged to continue. She will contact us if needed prior to return in 6 mos.

## 2011-02-11 NOTE — Patient Instructions (Signed)
Continue Protonix twice a day. Refills have been sent to your pharmacy.  If you notice any further upper abdominal pain where the hernia is located, increased nausea or vomiting, severe pain, seek medical attention.   We will see you back in 6 months. Please call us if you have any problems in the meantime!

## 2011-02-19 ENCOUNTER — Encounter: Payer: Self-pay | Admitting: Orthopedic Surgery

## 2011-02-19 ENCOUNTER — Ambulatory Visit (INDEPENDENT_AMBULATORY_CARE_PROVIDER_SITE_OTHER): Payer: BC Managed Care – PPO | Admitting: Orthopedic Surgery

## 2011-02-19 DIAGNOSIS — M19049 Primary osteoarthritis, unspecified hand: Secondary | ICD-10-CM

## 2011-02-19 NOTE — Patient Instructions (Addendum)
Referred back to hand and rehab for OT left thumb

## 2011-02-22 ENCOUNTER — Other Ambulatory Visit: Payer: Self-pay

## 2011-02-22 ENCOUNTER — Emergency Department (HOSPITAL_COMMUNITY)
Admission: EM | Admit: 2011-02-22 | Discharge: 2011-02-22 | Disposition: A | Discharge status: Home / Self Care | Payer: BC Managed Care – PPO | Attending: Emergency Medicine | Admitting: Emergency Medicine

## 2011-02-22 ENCOUNTER — Encounter (HOSPITAL_COMMUNITY): Payer: Self-pay | Admitting: Emergency Medicine

## 2011-02-22 ENCOUNTER — Emergency Department (HOSPITAL_COMMUNITY): Payer: BC Managed Care – PPO

## 2011-02-22 DIAGNOSIS — Z87828 Personal history of other (healed) physical injury and trauma: Secondary | ICD-10-CM | POA: Insufficient documentation

## 2011-02-22 DIAGNOSIS — K219 Gastro-esophageal reflux disease without esophagitis: Secondary | ICD-10-CM | POA: Insufficient documentation

## 2011-02-22 DIAGNOSIS — F329 Major depressive disorder, single episode, unspecified: Secondary | ICD-10-CM | POA: Insufficient documentation

## 2011-02-22 DIAGNOSIS — F411 Generalized anxiety disorder: Secondary | ICD-10-CM | POA: Insufficient documentation

## 2011-02-22 DIAGNOSIS — F17200 Nicotine dependence, unspecified, uncomplicated: Secondary | ICD-10-CM | POA: Insufficient documentation

## 2011-02-22 DIAGNOSIS — J3489 Other specified disorders of nose and nasal sinuses: Secondary | ICD-10-CM | POA: Insufficient documentation

## 2011-02-22 DIAGNOSIS — I10 Essential (primary) hypertension: Secondary | ICD-10-CM | POA: Insufficient documentation

## 2011-02-22 DIAGNOSIS — J4489 Other specified chronic obstructive pulmonary disease: Secondary | ICD-10-CM | POA: Insufficient documentation

## 2011-02-22 DIAGNOSIS — J40 Bronchitis, not specified as acute or chronic: Secondary | ICD-10-CM | POA: Insufficient documentation

## 2011-02-22 DIAGNOSIS — R059 Cough, unspecified: Secondary | ICD-10-CM | POA: Insufficient documentation

## 2011-02-22 DIAGNOSIS — R05 Cough: Secondary | ICD-10-CM | POA: Insufficient documentation

## 2011-02-22 DIAGNOSIS — I2510 Atherosclerotic heart disease of native coronary artery without angina pectoris: Secondary | ICD-10-CM | POA: Insufficient documentation

## 2011-02-22 DIAGNOSIS — F3289 Other specified depressive episodes: Secondary | ICD-10-CM | POA: Insufficient documentation

## 2011-02-22 DIAGNOSIS — J449 Chronic obstructive pulmonary disease, unspecified: Secondary | ICD-10-CM

## 2011-02-22 LAB — DIFFERENTIAL
Basophils Absolute: 0.1 10*3/uL (ref 0.0–0.1)
Basophils Relative: 1 % (ref 0–1)
Neutro Abs: 6.3 10*3/uL (ref 1.7–7.7)
Neutrophils Relative %: 66 % (ref 43–77)

## 2011-02-22 LAB — COMPREHENSIVE METABOLIC PANEL
AST: 13 U/L (ref 0–37)
Albumin: 3.6 g/dL (ref 3.5–5.2)
Alkaline Phosphatase: 79 U/L (ref 39–117)
Chloride: 99 mEq/L (ref 96–112)
Potassium: 3.4 mEq/L — ABNORMAL LOW (ref 3.5–5.1)
Sodium: 135 mEq/L (ref 135–145)
Total Bilirubin: 0.2 mg/dL — ABNORMAL LOW (ref 0.3–1.2)

## 2011-02-22 LAB — CBC
Hemoglobin: 13.4 g/dL (ref 12.0–15.0)
MCHC: 32.9 g/dL (ref 30.0–36.0)
Platelets: 248 10*3/uL (ref 150–400)
RDW: 14.4 % (ref 11.5–15.5)

## 2011-02-22 MED ORDER — TRAMADOL HCL 50 MG PO TABS: 50.0000 mg | ORAL_TABLET | Freq: Four times a day (QID) | ORAL | Status: DC | PRN

## 2011-02-22 MED ORDER — SODIUM CHLORIDE 0.9 % IV SOLN
INTRAVENOUS | Status: DC
  Administered 2011-02-22: 09:00:00 via INTRAVENOUS

## 2011-02-22 MED ORDER — ALBUTEROL SULFATE (5 MG/ML) 0.5% IN NEBU
5.0000 mg | INHALATION_SOLUTION | Freq: Once | RESPIRATORY_TRACT | Status: AC
  Administered 2011-02-22: 5 mg via RESPIRATORY_TRACT
  Filled 2011-02-22: qty 1

## 2011-02-22 MED ORDER — PREDNISONE 20 MG PO TABS
60.0000 mg | ORAL_TABLET | Freq: Once | ORAL | Status: AC
  Administered 2011-02-22: 60 mg via ORAL
  Filled 2011-02-22: qty 3

## 2011-02-22 MED ORDER — AZITHROMYCIN 250 MG PO TABS: 250.0000 mg | ORAL_TABLET | Freq: Every day | ORAL | Status: AC

## 2011-02-22 MED ORDER — TRAMADOL HCL 50 MG PO TABS: 50.0000 mg | ORAL_TABLET | Freq: Four times a day (QID) | ORAL | Status: AC | PRN

## 2011-02-22 MED ORDER — HYDROCOD POLST-CHLORPHEN POLST 10-8 MG/5ML PO LQCR: 5.0000 mL | Freq: Two times a day (BID) | ORAL | Status: DC | PRN

## 2011-02-22 MED ORDER — LORAZEPAM 2 MG/ML IJ SOLN
0.5000 mg | Freq: Once | INTRAMUSCULAR | Status: AC
  Administered 2011-02-22: 0.5 mg via INTRAVENOUS
  Filled 2011-02-22: qty 1

## 2011-02-22 MED ORDER — PREDNISONE 10 MG PO TABS: 20.0000 mg | ORAL_TABLET | Freq: Every day | ORAL | Status: AC

## 2011-02-22 MED ORDER — HYDROMORPHONE HCL PF 1 MG/ML IJ SOLN
1.0000 mg | Freq: Once | INTRAMUSCULAR | Status: AC
  Administered 2011-02-22: 1 mg via INTRAVENOUS
  Filled 2011-02-22: qty 1
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
## 2011-02-22 NOTE — ED Notes (Signed)
Patient requesting albuterol nebulizer before she is discharged. Dr Estell Harpin informed and verbal order for 5 mg albuterol nebulizer obtained. Respiratory paged.

## 2011-02-22 NOTE — ED Notes (Signed)
MD at bedside. 

## 2011-02-22 NOTE — ED Notes (Signed)
Pt c/o coughing, sob and nasal congestion since Wednesday.

## 2011-02-22 NOTE — ED Notes (Signed)
Patient reports pain medication did not help her. Ativan given per orders. Will reassess pain in 30 minutes.

## 2011-02-22 NOTE — ED Notes (Signed)
Patient with no complaints at this time. Respirations even and unlabored. Skin warm/dry. Discharge instructions reviewed with patient at this time. Patient given opportunity to voice concerns/ask questions. IV removed per policy and band-aid applied to site. Patient discharged at this time and left Emergency Department with steady gait.  

## 2011-03-11 DIAGNOSIS — J342 Deviated nasal septum: Secondary | ICD-10-CM | POA: Insufficient documentation

## 2011-03-11 DIAGNOSIS — J329 Chronic sinusitis, unspecified: Secondary | ICD-10-CM | POA: Insufficient documentation

## 2011-03-19 ENCOUNTER — Telehealth: Payer: Self-pay | Admitting: Orthopedic Surgery

## 2011-03-19 NOTE — Telephone Encounter (Signed)
Message copied by Vickki Hearing on Wed Mar 19, 2011  1:06 PM ------      Message from: Cammie Sickle A      Created: Wed Mar 19, 2011  8:56 AM      Regarding: status "open" office visit 02/19/11       Dr. Romeo Schrack,       RE: [name] MRN [id number], DOS 02/19/11, office visit status open.       Patient had signed a release for Dr. Eduard Clos to receive copy of office notes.  Had seen it was not completed or signed off? Thanks

## 2011-05-20 DIAGNOSIS — Z8719 Personal history of other diseases of the digestive system: Secondary | ICD-10-CM | POA: Insufficient documentation

## 2011-05-20 DIAGNOSIS — F32A Depression, unspecified: Secondary | ICD-10-CM | POA: Insufficient documentation

## 2011-05-20 DIAGNOSIS — J449 Chronic obstructive pulmonary disease, unspecified: Secondary | ICD-10-CM | POA: Insufficient documentation

## 2011-05-20 DIAGNOSIS — I10 Essential (primary) hypertension: Secondary | ICD-10-CM | POA: Insufficient documentation

## 2011-05-20 DIAGNOSIS — F419 Anxiety disorder, unspecified: Secondary | ICD-10-CM | POA: Insufficient documentation

## 2011-05-23 HISTORY — PX: NASAL SINUS SURGERY: SHX719

## 2011-06-03 ENCOUNTER — Other Ambulatory Visit: Payer: Self-pay

## 2011-06-04 MED ORDER — PANTOPRAZOLE SODIUM 40 MG PO TBEC: 40.0000 mg | DELAYED_RELEASE_TABLET | Freq: Two times a day (BID) | ORAL | Status: DC

## 2011-07-15 ENCOUNTER — Encounter: Payer: Self-pay | Admitting: Gastroenterology

## 2011-07-16 ENCOUNTER — Encounter: Payer: Self-pay | Admitting: Gastroenterology

## 2011-07-16 ENCOUNTER — Ambulatory Visit (INDEPENDENT_AMBULATORY_CARE_PROVIDER_SITE_OTHER): Payer: BC Managed Care – PPO | Admitting: Gastroenterology

## 2011-07-16 ENCOUNTER — Telehealth: Payer: Self-pay | Admitting: Gastroenterology

## 2011-07-16 VITALS — BP 110/60 | HR 72 | Temp 98.2°F | Ht 63.0 in | Wt 152.6 lb

## 2011-07-16 DIAGNOSIS — R140 Abdominal distension (gaseous): Secondary | ICD-10-CM | POA: Insufficient documentation

## 2011-07-16 DIAGNOSIS — R142 Eructation: Secondary | ICD-10-CM

## 2011-07-16 DIAGNOSIS — K2970 Gastritis, unspecified, without bleeding: Secondary | ICD-10-CM

## 2011-07-16 DIAGNOSIS — R110 Nausea: Secondary | ICD-10-CM

## 2011-07-16 DIAGNOSIS — F419 Anxiety disorder, unspecified: Secondary | ICD-10-CM

## 2011-07-16 DIAGNOSIS — F32A Depression, unspecified: Secondary | ICD-10-CM

## 2011-07-16 DIAGNOSIS — K439 Ventral hernia without obstruction or gangrene: Secondary | ICD-10-CM

## 2011-07-16 DIAGNOSIS — R141 Gas pain: Secondary | ICD-10-CM

## 2011-07-16 DIAGNOSIS — F341 Dysthymic disorder: Secondary | ICD-10-CM

## 2011-07-16 MED ORDER — ONDANSETRON HCL 4 MG PO TABS: 4.0000 mg | ORAL_TABLET | Freq: Three times a day (TID) | ORAL | Status: DC | PRN

## 2011-07-16 NOTE — Patient Instructions (Signed)
CONTINUE THE PROBIOTIC TO HELP WITH THE BLOATING.  SEE SURGERY TO DISCUSS YOUR HERNIA REPAIR.  USE ZOFRAN AS NEEDED FOR NAUSEA,WHICH LIKELY DUE TO YOUR STEROID USE.  FOLLOW GASTROPARESIS STEP III DIET.  GET THE GASTRIC EMPTYING STUDY. THE TEST LAST 2 HOURS.  FOLLOW UP IN 2 MOS.

## 2011-07-16 NOTE — Telephone Encounter (Signed)
Referral and records faxed to Dr. Ula Lingo

## 2011-07-16 NOTE — Assessment & Plan Note (Signed)
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  PROBIOTIC DAILY OPV IN 2 MOS.

## 2011-07-16 NOTE — Assessment & Plan Note (Signed)
LIKELY DUE TO STEROIDS.  PROTONIX BID. ZOFRAN PRN. GES TO EVALUATE FOR GASTROPARESIS AS ETIOLOGY FOR NAUSEA. OPV IN 2 MOS.

## 2011-07-16 NOTE — Progress Notes (Signed)
Faxed to PCP

## 2011-07-16 NOTE — Assessment & Plan Note (Signed)
CAUSING PAIN. LIKEY DUE TO PROLONGED COUGHING EPISODE IN THE PAST.  SURGERY REFERRAL FOR HERNIA REPAIR.

## 2011-07-16 NOTE — Assessment & Plan Note (Signed)
NEEDS MH REFERRAL FOR [place] IF POSSIBLE.
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
## 2011-07-17 ENCOUNTER — Other Ambulatory Visit: Payer: Self-pay

## 2011-07-17 ENCOUNTER — Encounter (HOSPITAL_COMMUNITY): Payer: Self-pay | Admitting: Emergency Medicine

## 2011-07-17 ENCOUNTER — Emergency Department (HOSPITAL_COMMUNITY): Payer: BC Managed Care – PPO

## 2011-07-17 ENCOUNTER — Emergency Department (HOSPITAL_COMMUNITY)
Admission: EM | Admit: 2011-07-17 | Discharge: 2011-07-17 | Disposition: A | Discharge status: Home / Self Care | Payer: BC Managed Care – PPO | Attending: Emergency Medicine | Admitting: Emergency Medicine

## 2011-07-17 DIAGNOSIS — I2510 Atherosclerotic heart disease of native coronary artery without angina pectoris: Secondary | ICD-10-CM | POA: Insufficient documentation

## 2011-07-17 DIAGNOSIS — J449 Chronic obstructive pulmonary disease, unspecified: Secondary | ICD-10-CM | POA: Insufficient documentation

## 2011-07-17 DIAGNOSIS — I10 Essential (primary) hypertension: Secondary | ICD-10-CM | POA: Insufficient documentation

## 2011-07-17 DIAGNOSIS — R5383 Other fatigue: Secondary | ICD-10-CM | POA: Insufficient documentation

## 2011-07-17 DIAGNOSIS — F341 Dysthymic disorder: Secondary | ICD-10-CM | POA: Insufficient documentation

## 2011-07-17 DIAGNOSIS — K219 Gastro-esophageal reflux disease without esophagitis: Secondary | ICD-10-CM | POA: Insufficient documentation

## 2011-07-17 DIAGNOSIS — R42 Dizziness and giddiness: Secondary | ICD-10-CM

## 2011-07-17 DIAGNOSIS — R5381 Other malaise: Secondary | ICD-10-CM | POA: Insufficient documentation

## 2011-07-17 DIAGNOSIS — J4489 Other specified chronic obstructive pulmonary disease: Secondary | ICD-10-CM | POA: Insufficient documentation

## 2011-07-17 DIAGNOSIS — E876 Hypokalemia: Secondary | ICD-10-CM | POA: Insufficient documentation

## 2011-07-17 DIAGNOSIS — R55 Syncope and collapse: Secondary | ICD-10-CM | POA: Insufficient documentation

## 2011-07-17 DIAGNOSIS — Z79899 Other long term (current) drug therapy: Secondary | ICD-10-CM | POA: Insufficient documentation

## 2011-07-17 DIAGNOSIS — R079 Chest pain, unspecified: Secondary | ICD-10-CM | POA: Insufficient documentation

## 2011-07-17 DIAGNOSIS — R110 Nausea: Secondary | ICD-10-CM | POA: Insufficient documentation

## 2011-07-17 LAB — CBC
HCT: 44.4 % (ref 36.0–46.0)
Hemoglobin: 14.5 g/dL (ref 12.0–15.0)
MCV: 94.7 fL (ref 78.0–100.0)
RBC: 4.69 MIL/uL (ref 3.87–5.11)
WBC: 8.9 10*3/uL (ref 4.0–10.5)

## 2011-07-17 LAB — POCT I-STAT, CHEM 8
BUN: 24 mg/dL — ABNORMAL HIGH (ref 6–23)
Calcium, Ion: 1.14 mmol/L (ref 1.12–1.32)
Chloride: 100 mEq/L (ref 96–112)
Glucose, Bld: 92 mg/dL (ref 70–99)
Potassium: 3.1 mEq/L — ABNORMAL LOW (ref 3.5–5.1)

## 2011-07-17 LAB — DIFFERENTIAL
Eosinophils Relative: 5 % (ref 0–5)
Lymphocytes Relative: 21 % (ref 12–46)
Lymphs Abs: 1.9 10*3/uL (ref 0.7–4.0)
Monocytes Absolute: 0.8 10*3/uL (ref 0.1–1.0)
Monocytes Relative: 8 % (ref 3–12)

## 2011-07-17 MED ORDER — MECLIZINE HCL 12.5 MG PO TABS
25.0000 mg | ORAL_TABLET | Freq: Once | ORAL | Status: AC
  Administered 2011-07-17: 25 mg via ORAL
  Filled 2011-07-17: qty 2

## 2011-07-17 MED ORDER — SODIUM CHLORIDE 0.9 % IV BOLUS (SEPSIS)
1000.0000 mL | Freq: Once | INTRAVENOUS | Status: AC
  Administered 2011-07-17: 1000 mL via INTRAVENOUS

## 2011-07-17 MED ORDER — ONDANSETRON HCL 4 MG/2ML IJ SOLN
4.0000 mg | Freq: Once | INTRAMUSCULAR | Status: AC
  Administered 2011-07-17: 4 mg via INTRAVENOUS
  Filled 2011-07-17: qty 2

## 2011-07-17 MED ORDER — NALOXONE HCL 0.4 MG/ML IJ SOLN
0.4000 mg | Freq: Once | INTRAMUSCULAR | Status: AC
  Administered 2011-07-17: 0.4 mg via INTRAVENOUS
  Filled 2011-07-17: qty 1

## 2011-07-17 MED ORDER — POTASSIUM CHLORIDE CRYS ER 20 MEQ PO TBCR
40.0000 meq | EXTENDED_RELEASE_TABLET | Freq: Once | ORAL | Status: AC
  Administered 2011-07-17: 40 meq via ORAL
  Filled 2011-07-17: qty 2

## 2011-07-17 NOTE — Discharge Instructions (Signed)

## 2011-07-17 NOTE — ED Notes (Signed)
Pt ambulated. States dizziness much better but still present. Pt was slow walking but stable. Nad. edp aware.
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
## 2011-07-17 NOTE — ED Notes (Signed)
Pt CBG is 94.

## 2011-07-17 NOTE — ED Notes (Signed)
Pt getting physical at pcp today and became dizzy and had syncopal episode. Staff states looked like a seizure at pcp. Pt arrived lethargic with generalized weakness observed. Pt c/o dizziness lying still. Alert/oriented. C/o "some nausea" and pressure pain to r side of chest that is worse with palpation. C/o slight cough prior to today. Pt unstable with standing while doing orthostatics and dizziness became worse with movement. Moving all extremities. denies sob. Color wnl.

## 2011-07-21 ENCOUNTER — Encounter (HOSPITAL_COMMUNITY)
Admission: RE | Admit: 2011-07-21 | Discharge: 2011-07-21 | Disposition: A | Discharge status: Home / Self Care | Payer: BC Managed Care – PPO | Source: Ambulatory Visit | Attending: Gastroenterology | Admitting: Gastroenterology

## 2011-07-21 ENCOUNTER — Encounter (HOSPITAL_COMMUNITY): Payer: Self-pay

## 2011-07-21 ENCOUNTER — Telehealth: Payer: Self-pay | Admitting: Gastroenterology

## 2011-07-21 DIAGNOSIS — R110 Nausea: Secondary | ICD-10-CM | POA: Insufficient documentation

## 2011-07-21 DIAGNOSIS — K3189 Other diseases of stomach and duodenum: Secondary | ICD-10-CM | POA: Insufficient documentation

## 2011-07-21 DIAGNOSIS — R109 Unspecified abdominal pain: Secondary | ICD-10-CM | POA: Insufficient documentation

## 2011-07-21 MED ORDER — TECHNETIUM TC 99M SULFUR COLLOID
2.0000 | Freq: Once | INTRAVENOUS | Status: AC | PRN
  Administered 2011-07-21: 2 via ORAL

## 2011-07-21 NOTE — Telephone Encounter (Signed)
Please call pt. Her gastric emptying study shows her stomach does not empty fast enough. This is causing her to have early morning nausea. She should follow thE gastroparesis diet handout FOREVER. CONTINUE PROTONIX. Use Zofran as needed but if she follows her diet she will have less nausea. She needs to have blood drawn to check her thyroid, & adrenal glands, & for diabetes (TSH, CORTISOL, HGA1C).

## 2011-07-21 NOTE — Telephone Encounter (Signed)
Results cc to PCP 

## 2011-07-22 ENCOUNTER — Other Ambulatory Visit: Payer: Self-pay

## 2011-07-22 ENCOUNTER — Telehealth: Payer: Self-pay | Admitting: Gastroenterology

## 2011-07-22 DIAGNOSIS — K3184 Gastroparesis: Secondary | ICD-10-CM

## 2011-07-22 NOTE — Telephone Encounter (Signed)
Pt was informed of her results. Lab orders mailed to her, she will take to PCP and have blood drawn.

## 2011-07-22 NOTE — Telephone Encounter (Signed)
Pt is scheduled to see Dr Leticia Penna on 04/09 @ 9:45- I spoke with Ms Tamura, she is aware of this appointment.

## 2011-07-23 ENCOUNTER — Encounter: Payer: Self-pay | Admitting: Gastroenterology

## 2011-07-23 NOTE — Progress Notes (Signed)
Pt is aware of OV and appt card was mailed °

## 2011-09-03 ENCOUNTER — Encounter: Payer: Self-pay | Admitting: Gastroenterology

## 2011-09-03 ENCOUNTER — Ambulatory Visit (INDEPENDENT_AMBULATORY_CARE_PROVIDER_SITE_OTHER): Payer: BC Managed Care – PPO | Admitting: Gastroenterology

## 2011-09-03 VITALS — BP 111/64 | HR 93 | Temp 98.2°F | Ht 63.0 in | Wt 156.8 lb

## 2011-09-03 DIAGNOSIS — K2990 Gastroduodenitis, unspecified, without bleeding: Secondary | ICD-10-CM

## 2011-09-03 DIAGNOSIS — K2970 Gastritis, unspecified, without bleeding: Secondary | ICD-10-CM

## 2011-09-03 DIAGNOSIS — K3184 Gastroparesis: Secondary | ICD-10-CM

## 2011-09-03 NOTE — Progress Notes (Signed)
Faxed to PCP

## 2011-09-03 NOTE — Patient Instructions (Addendum)
CONTINUE GASTROPARESIS DIET.  USE ZOFRAN AS NEEDED.  CONTINUE PROTONIX.  FOLLOW UP IN 6 MOS.
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
## 2011-09-03 NOTE — Assessment & Plan Note (Signed)
SX CONTROLLED.  CONTINUE PROTONIX.

## 2011-09-03 NOTE — Assessment & Plan Note (Signed)
NAUSEA IMPROVED.  CONTINUE GASTROPARESIS DIET. USE ZOFRAN AS NEEDED. FOLLOW UP IN 6 MOS.

## 2011-09-04 NOTE — Progress Notes (Signed)
Reminder in epic to follow up in 6 months °

## 2011-09-10 ENCOUNTER — Ambulatory Visit: Payer: BC Managed Care – PPO | Admitting: Gastroenterology

## 2011-09-29 ENCOUNTER — Encounter (HOSPITAL_COMMUNITY): Payer: Self-pay | Admitting: Pharmacy Technician

## 2011-09-29 NOTE — Patient Instructions (Addendum)
20 [name]  09/29/2011   Your procedure is scheduled on:  Wednesday, June 10  Report to [facility] at 0700 AM.  Call this number if you have problems the morning of surgery: [phone]   Remember:   Do not eat food:After Midnight.  May have liquids:until Midnight .  Take these medicines the morning of surgery with A SIP OF WATER: Wellbutrin, Zyrtec, Valium,Cozaar, Zofran, Percocet,Maxizide. Use inhalers before coming to the hospital. Bring inhalers with you.   Do not wear jewelry, make-up or nail polish.  Do not wear lotions, powders, or perfumes. You may wear deodorant.  Do not shave 48 hours prior to surgery. Men may shave face and neck.  Do not bring valuables to the hospital.  Contacts, dentures or bridgework may not be worn into surgery.  Leave suitcase in the car. After surgery it may be brought to your room.  For patients admitted to the hospital, checkout time is 11:00 AM the day of discharge.   Patients discharged the day of surgery will not be allowed to drive home.  Name and phone number of your driver: Family or friends  Special Instructions: CHG Shower Use Special Wash: 1/2 bottle night before surgery and 1/2 bottle morning of surgery.   Please read over the following fact sheets that you were given: Pain Booklet, Coughing and Deep Breathing, MRSA Information, Surgical Site Infection Prevention, Anesthesia Post-op Instructions and Care and Recovery After Surgery  Hernia A hernia occurs when an internal organ pushes out through a weak spot in the abdominal wall. Hernias most commonly occur in the groin and around the navel. Hernias often can be pushed back into place (reduced). Most hernias tend to get worse over time. Some abdominal hernias can get stuck in the opening (irreducible or incarcerated hernia) and cannot be reduced. An irreducible abdominal hernia which is tightly squeezed into the opening is at risk for impaired blood supply (strangulated hernia). A  strangulated hernia is a medical emergency. Because of the risk for an irreducible or strangulated hernia, surgery may be recommended to repair a hernia. CAUSES   Heavy lifting.   Prolonged coughing.   Straining to have a bowel movement.   A cut (incision) made during an abdominal surgery.  HOME CARE INSTRUCTIONS   Bed rest is not required. You may continue your normal activities.   Avoid lifting more than 10 pounds (4.5 kg) or straining.   Cough gently. If you are a smoker it is best to stop. Even the best hernia repair can break down with the continual strain of coughing. Even if you do not have your hernia repaired, a cough will continue to aggravate the problem.   Do not wear anything tight over your hernia. Do not try to keep it in with an outside bandage or truss. These can damage abdominal contents if they are trapped within the hernia sac.   Eat a normal diet.   Avoid constipation. Straining over long periods of time will increase hernia size and encourage breakdown of repairs. If you cannot do this with diet alone, stool softeners may be used.  SEEK IMMEDIATE MEDICAL CARE IF:   You have a fever.   You develop increasing abdominal pain.   You feel nauseous or vomit.   Your hernia is stuck outside the abdomen, looks discolored, feels hard, or is tender.   You have any changes in your bowel habits or in the hernia that are unusual for you.   You have increased pain  or swelling around the hernia.   You cannot push the hernia back in place by applying gentle pressure while lying down.  MAKE SURE YOU:   Understand these instructions.   Will watch your condition.   Will get help right away if you are not doing well or get worse.    PATIENT INSTRUCTIONS POST-ANESTHESIA  IMMEDIATELY FOLLOWING SURGERY:  Do not drive or operate machinery for the first twenty four hours after surgery.  Do not make any important decisions for twenty four hours after surgery or while  taking narcotic pain medications or sedatives.  If you develop intractable nausea and vomiting or a severe headache please notify your doctor immediately.  FOLLOW-UP:  Please make an appointment with your surgeon as instructed. You do not need to follow up with anesthesia unless specifically instructed to do so.  WOUND CARE INSTRUCTIONS (if applicable):  Keep a dry clean dressing on the anesthesia/puncture wound site if there is drainage.  Once the wound has quit draining you may leave it open to air.  Generally you should leave the bandage intact for twenty four hours unless there is drainage.  If the epidural site drains for more than 36-48 hours please call the anesthesia department.  QUESTIONS?:  Please feel free to call your physician or the hospital operator if you have any questions, and they will be happy to assist you.

## 2011-09-30 ENCOUNTER — Encounter (HOSPITAL_COMMUNITY): Payer: Self-pay

## 2011-09-30 ENCOUNTER — Encounter (HOSPITAL_COMMUNITY)
Admission: RE | Admit: 2011-09-30 | Discharge: 2011-09-30 | Disposition: A | Discharge status: Home / Self Care | Payer: BC Managed Care – PPO | Source: Ambulatory Visit | Attending: General Surgery | Admitting: General Surgery

## 2011-09-30 LAB — BASIC METABOLIC PANEL
BUN: 9 mg/dL (ref 6–23)
Chloride: 102 mEq/L (ref 96–112)
GFR calc Af Amer: 66 mL/min — ABNORMAL LOW (ref >90)
GFR calc non Af Amer: 57 mL/min — ABNORMAL LOW (ref >90)
Potassium: 3.6 mEq/L (ref 3.5–5.1)
Sodium: 139 mEq/L (ref 135–145)

## 2011-09-30 LAB — CBC
HCT: 41 % (ref 36.0–46.0)
Hemoglobin: 13.5 g/dL (ref 12.0–15.0)
RDW: 14 % (ref 11.5–15.5)
WBC: 8.8 10*3/uL (ref 4.0–10.5)

## 2011-09-30 LAB — SURGICAL PCR SCREEN
MRSA, PCR: NEGATIVE
Staphylococcus aureus: NEGATIVE

## 2011-10-01 ENCOUNTER — Ambulatory Visit (HOSPITAL_COMMUNITY): Payer: BC Managed Care – PPO | Admitting: Anesthesiology

## 2011-10-01 ENCOUNTER — Encounter (HOSPITAL_COMMUNITY): Payer: Self-pay | Admitting: *Deleted

## 2011-10-01 ENCOUNTER — Ambulatory Visit (HOSPITAL_COMMUNITY)
Admission: RE | Admit: 2011-10-01 | Discharge: 2011-10-01 | Disposition: A | Discharge status: Home / Self Care | Payer: BC Managed Care – PPO | Source: Ambulatory Visit | Attending: General Surgery | Admitting: General Surgery

## 2011-10-01 ENCOUNTER — Encounter (HOSPITAL_COMMUNITY): Payer: Self-pay | Admitting: Anesthesiology

## 2011-10-01 ENCOUNTER — Encounter (HOSPITAL_COMMUNITY)
Admission: RE | Disposition: A | Discharge status: Home / Self Care | Payer: Self-pay | Source: Ambulatory Visit | Attending: General Surgery

## 2011-10-01 DIAGNOSIS — Z79899 Other long term (current) drug therapy: Secondary | ICD-10-CM | POA: Insufficient documentation

## 2011-10-01 DIAGNOSIS — K439 Ventral hernia without obstruction or gangrene: Secondary | ICD-10-CM

## 2011-10-01 DIAGNOSIS — J4489 Other specified chronic obstructive pulmonary disease: Secondary | ICD-10-CM | POA: Insufficient documentation

## 2011-10-01 DIAGNOSIS — K432 Incisional hernia without obstruction or gangrene: Secondary | ICD-10-CM | POA: Insufficient documentation

## 2011-10-01 DIAGNOSIS — I10 Essential (primary) hypertension: Secondary | ICD-10-CM | POA: Insufficient documentation

## 2011-10-01 DIAGNOSIS — J449 Chronic obstructive pulmonary disease, unspecified: Secondary | ICD-10-CM | POA: Insufficient documentation

## 2011-10-01 HISTORY — PX: INCISIONAL HERNIA REPAIR: SHX193

## 2011-10-01 SURGERY — REPAIR, HERNIA, INCISIONAL
Anesthesia: General | Site: Abdomen | Wound class: Clean

## 2011-10-01 MED ORDER — ENOXAPARIN SODIUM 40 MG/0.4ML ~~LOC~~ SOLN
SUBCUTANEOUS | Status: AC
  Administered 2011-10-01: 40 mg via SUBCUTANEOUS
  Filled 2011-10-01: qty 0.4

## 2011-10-01 MED ORDER — HYDROCODONE-ACETAMINOPHEN 5-325 MG PO TABS: 1.0000 | ORAL_TABLET | ORAL | Status: AC | PRN

## 2011-10-01 MED ORDER — HYDROMORPHONE HCL PF 1 MG/ML IJ SOLN
INTRAMUSCULAR | Status: AC
  Administered 2011-10-01: 1 mg
  Filled 2011-10-01: qty 1

## 2011-10-01 MED ORDER — GLYCOPYRROLATE 0.2 MG/ML IJ SOLN
INTRAMUSCULAR | Status: DC | PRN
  Administered 2011-10-01: 0.4 mg via INTRAVENOUS

## 2011-10-01 MED ORDER — PROMETHAZINE HCL 25 MG/ML IJ SOLN
6.2500 mg | Freq: Once | INTRAMUSCULAR | Status: AC
Start: 2011-10-01 — End: 2011-10-01
  Administered 2011-10-01: 6.25 mg via INTRAVENOUS

## 2011-10-01 MED ORDER — 0.9 % SODIUM CHLORIDE (POUR BTL) OPTIME
TOPICAL | Status: DC | PRN
  Administered 2011-10-01: 1000 mL

## 2011-10-01 MED ORDER — ROCURONIUM BROMIDE 100 MG/10ML IV SOLN
INTRAVENOUS | Status: DC | PRN
  Administered 2011-10-01: 25 mg via INTRAVENOUS
  Administered 2011-10-01: 5 mg via INTRAVENOUS

## 2011-10-01 MED ORDER — BUPIVACAINE HCL (PF) 0.5 % IJ SOLN
INTRAMUSCULAR | Status: DC | PRN
  Administered 2011-10-01: 10 mL

## 2011-10-01 MED ORDER — CELECOXIB 100 MG PO CAPS
400.0000 mg | ORAL_CAPSULE | Freq: Every day | ORAL | Status: AC
  Administered 2011-10-01: 400 mg via ORAL

## 2011-10-01 MED ORDER — PROPOFOL 10 MG/ML IV EMUL
INTRAVENOUS | Status: DC | PRN
  Administered 2011-10-01: 20 mg via INTRAVENOUS
  Administered 2011-10-01: 150 mg via INTRAVENOUS
  Administered 2011-10-01: 30 mg via INTRAVENOUS

## 2011-10-01 MED ORDER — FENTANYL CITRATE 0.05 MG/ML IJ SOLN
INTRAMUSCULAR | Status: DC | PRN
  Administered 2011-10-01 (×2): 50 ug via INTRAVENOUS

## 2011-10-01 MED ORDER — NEOSTIGMINE METHYLSULFATE 1 MG/ML IJ SOLN
INTRAMUSCULAR | Status: AC
  Filled 2011-10-01: qty 10

## 2011-10-01 MED ORDER — FENTANYL CITRATE 0.05 MG/ML IJ SOLN
INTRAMUSCULAR | Status: AC
  Administered 2011-10-01: 50 ug via INTRAVENOUS
  Filled 2011-10-01: qty 2

## 2011-10-01 MED ORDER — PROMETHAZINE HCL 25 MG/ML IJ SOLN
INTRAMUSCULAR | Status: AC
  Administered 2011-10-01: 6.25 mg via INTRAVENOUS
  Filled 2011-10-01: qty 1

## 2011-10-01 MED ORDER — CEFAZOLIN SODIUM 1-5 GM-% IV SOLN
1.0000 g | Freq: Once | INTRAVENOUS | Status: AC
  Administered 2011-10-01: 1 g via INTRAVENOUS

## 2011-10-01 MED ORDER — POVIDONE-IODINE 10 % EX OINT
TOPICAL_OINTMENT | CUTANEOUS | Status: AC
  Filled 2011-10-01: qty 1

## 2011-10-01 MED ORDER — GLYCOPYRROLATE 0.2 MG/ML IJ SOLN
INTRAMUSCULAR | Status: AC
  Filled 2011-10-01: qty 1

## 2011-10-01 MED ORDER — FENTANYL CITRATE 0.05 MG/ML IJ SOLN
50.0000 ug | INTRAMUSCULAR | Status: AC
  Administered 2011-10-01 (×3): 50 ug via INTRAVENOUS

## 2011-10-01 MED ORDER — CEFAZOLIN SODIUM 1-5 GM-% IV SOLN
INTRAVENOUS | Status: AC
  Filled 2011-10-01: qty 50

## 2011-10-01 MED ORDER — ONDANSETRON HCL 4 MG/2ML IJ SOLN
INTRAMUSCULAR | Status: AC
  Administered 2011-10-01: 4 mg via INTRAVENOUS
  Filled 2011-10-01: qty 2

## 2011-10-01 MED ORDER — MIDAZOLAM BOLUS VIA INFUSION
1.0000 mg | Freq: Once | INTRAVENOUS | Status: AC
  Administered 2011-10-01: 1 mg via INTRAVENOUS

## 2011-10-01 MED ORDER — NEOSTIGMINE METHYLSULFATE 1 MG/ML IJ SOLN
INTRAMUSCULAR | Status: DC | PRN
  Administered 2011-10-01: 2 mg via INTRAVENOUS

## 2011-10-01 MED ORDER — ENOXAPARIN SODIUM 40 MG/0.4ML ~~LOC~~ SOLN
40.0000 mg | SUBCUTANEOUS | Status: AC
  Administered 2011-10-01: 40 mg via SUBCUTANEOUS

## 2011-10-01 MED ORDER — ONDANSETRON HCL 4 MG/2ML IJ SOLN
4.0000 mg | Freq: Once | INTRAMUSCULAR | Status: AC
  Administered 2011-10-01: 4 mg via INTRAVENOUS

## 2011-10-01 MED ORDER — HYDROMORPHONE HCL PF 1 MG/ML IJ SOLN
INTRAMUSCULAR | Status: AC
  Filled 2011-10-01: qty 1

## 2011-10-01 MED ORDER — MIDAZOLAM HCL 2 MG/2ML IJ SOLN
INTRAMUSCULAR | Status: AC
  Administered 2011-10-01: 1 mg via INTRAVENOUS
  Filled 2011-10-01: qty 2

## 2011-10-01 MED ORDER — ONDANSETRON HCL 4 MG/2ML IJ SOLN: 4.0000 mg | Freq: Once | INTRAMUSCULAR | Status: DC | PRN

## 2011-10-01 MED ORDER — MIDAZOLAM HCL 2 MG/2ML IJ SOLN
INTRAMUSCULAR | Status: AC
  Filled 2011-10-01: qty 2

## 2011-10-01 MED ORDER — HYDROMORPHONE BOLUS VIA INFUSION
1.0000 mg | Freq: Once | INTRAVENOUS | Status: DC
  Administered 2011-10-01: 1 mg via INTRAVENOUS

## 2011-10-01 MED ORDER — FENTANYL CITRATE 0.05 MG/ML IJ SOLN
25.0000 ug | INTRAMUSCULAR | Status: DC | PRN
  Administered 2011-10-01 (×5): 50 ug via INTRAVENOUS

## 2011-10-01 MED ORDER — LACTATED RINGERS IV SOLN
INTRAVENOUS | Status: DC
  Administered 2011-10-01: 1000 mL via INTRAVENOUS

## 2011-10-01 MED ORDER — LIDOCAINE HCL (CARDIAC) 10 MG/ML IV SOLN
INTRAVENOUS | Status: DC | PRN
  Administered 2011-10-01: 10 mg via INTRAVENOUS

## 2011-10-01 MED ORDER — SODIUM CHLORIDE 0.9 % IJ SOLN
INTRAMUSCULAR | Status: AC
  Filled 2011-10-01: qty 10

## 2011-10-01 MED ORDER — BUPIVACAINE HCL (PF) 0.5 % IJ SOLN
INTRAMUSCULAR | Status: AC
  Filled 2011-10-01: qty 30

## 2011-10-01 MED ORDER — MIDAZOLAM HCL 2 MG/2ML IJ SOLN
INTRAMUSCULAR | Status: AC
  Administered 2011-10-01: 2 mg via INTRAVENOUS
  Filled 2011-10-01: qty 2

## 2011-10-01 MED ORDER — CELECOXIB 100 MG PO CAPS
ORAL_CAPSULE | ORAL | Status: AC
  Administered 2011-10-01: 400 mg via ORAL
  Filled 2011-10-01: qty 4

## 2011-10-01 MED ORDER — MIDAZOLAM HCL 2 MG/2ML IJ SOLN
1.0000 mg | INTRAMUSCULAR | Status: AC | PRN
  Administered 2011-10-01: 2 mg via INTRAVENOUS
  Administered 2011-10-01: 1 mg via INTRAVENOUS
  Administered 2011-10-01: 2 mg via INTRAVENOUS

## 2011-10-01 MED ORDER — HYDROMORPHONE HCL PF 1 MG/ML IJ SOLN: 1.0000 mg | Freq: Once | INTRAMUSCULAR | Status: DC

## 2011-10-01 SURGICAL SUPPLY — 32 items
BAG HAMPER (MISCELLANEOUS) ×2 IMPLANT
BENZOIN TINCTURE PRP APPL 2/3 (GAUZE/BANDAGES/DRESSINGS) ×2 IMPLANT
CLOTH BEACON ORANGE TIMEOUT ST (SAFETY) ×2 IMPLANT
COVER LIGHT HANDLE STERIS (MISCELLANEOUS) ×4 IMPLANT
DECANTER SPIKE VIAL GLASS SM (MISCELLANEOUS) IMPLANT
DURAPREP 26ML APPLICATOR (WOUND CARE) ×2 IMPLANT
ELECT REM PT RETURN 9FT ADLT (ELECTROSURGICAL) ×2
ELECTRODE REM PT RTRN 9FT ADLT (ELECTROSURGICAL) ×1 IMPLANT
FORMALIN 10 PREFIL 480ML (MISCELLANEOUS) ×2 IMPLANT
GLOVE BIOGEL PI IND STRL 7.5 (GLOVE) ×1 IMPLANT
GLOVE BIOGEL PI INDICATOR 7.5 (GLOVE) ×1
GLOVE ECLIPSE 7.0 STRL STRAW (GLOVE) ×2 IMPLANT
GOWN STRL REIN XL XLG (GOWN DISPOSABLE) ×6 IMPLANT
INST SET MAJOR GENERAL (KITS) ×2 IMPLANT
KIT ROOM TURNOVER APOR (KITS) ×2 IMPLANT
MANIFOLD NEPTUNE II (INSTRUMENTS) ×2 IMPLANT
NEEDLE HYPO 25X1 1.5 SAFETY (NEEDLE) ×2 IMPLANT
NS IRRIG 1000ML POUR BTL (IV SOLUTION) ×2 IMPLANT
PACK ABDOMINAL MAJOR (CUSTOM PROCEDURE TRAY) ×2 IMPLANT
PAD ARMBOARD 7.5X6 YLW CONV (MISCELLANEOUS) ×2 IMPLANT
PATCH VENTRAL SMALL 4.3 (Mesh Specialty) ×2 IMPLANT
SET BASIN LINEN APH (SET/KITS/TRAYS/PACK) ×2 IMPLANT
SPONGE GAUZE 4X4 12PLY (GAUZE/BANDAGES/DRESSINGS) ×2 IMPLANT
STAPLER VISISTAT (STAPLE) IMPLANT
STRIP CLOSURE SKIN 1/2X4 (GAUZE/BANDAGES/DRESSINGS) ×2 IMPLANT
SUT ETHIBOND 0 MO6 C/R (SUTURE) ×2 IMPLANT
SUT MNCRL AB 4-0 PS2 18 (SUTURE) ×4 IMPLANT
SUT VIC AB 2-0 CT1 27 (SUTURE)
SUT VIC AB 2-0 CT1 TAPERPNT 27 (SUTURE) IMPLANT
SUT VICRYL AB 3-0 FS1 BRD 27IN (SUTURE) ×2 IMPLANT
SYR BULB IRRIGATION 50ML (SYRINGE) ×2 IMPLANT
SYR CONTROL 10ML LL (SYRINGE) ×2 IMPLANT

## 2011-10-01 NOTE — Anesthesia Procedure Notes (Signed)
Procedure Name: Intubation Date/Time: 10/01/2011 9:23 AM Performed by: Franco Nones Pre-anesthesia Checklist: Patient identified, Patient being monitored, Timeout performed, Emergency Drugs available and Suction available Patient Re-evaluated:Patient Re-evaluated prior to inductionOxygen Delivery Method: Circle System Utilized Preoxygenation: Pre-oxygenation with 100% oxygen Intubation Type: IV induction Ventilation: Mask ventilation without difficulty Laryngoscope Size: Miller and 2 Grade View: Grade I Tube type: Oral Tube size: 7.0 mm Number of attempts: 1 Airway Equipment and Method: stylet Placement Confirmation: ETT inserted through vocal cords under direct vision,  positive ETCO2 and breath sounds checked- equal and bilateral Secured at: 21 cm Tube secured with: Tape Dental Injury: Teeth and Oropharynx as per pre-operative assessment

## 2011-10-01 NOTE — Interval H&P Note (Signed)
History and Physical Interval Note:  10/01/2011 9:01 AM  [name]  has presented today for surgery, with the diagnosis of incisional hernia   The various methods of treatment have been discussed with the patient and family. After consideration of risks, benefits and other options for treatment, the patient has consented to  Procedure(s) (LRB): HERNIA REPAIR INCISIONAL (N/A) as a surgical intervention .  The patients' history has been reviewed, patient examined, no change in status, stable for surgery.  I have reviewed the patients' chart and labs.  Questions were answered to the patient's satisfaction.     Manette Doto C

## 2011-10-01 NOTE — Op Note (Signed)
Patient:  [name]  DOB:  1960/12/30  MRN:  [id number]   Preop Diagnosis:  Incisional hernia  Postop Diagnosis:  The same  Procedure:  Open incisional hernia repair with mesh  Surgeon:  Dr. Tilford Pillar  Anes:  General endotracheal, 0.5% Sensorcaine plain for local  Indications:  Patient is a 51 year old female presented my office with a history of epigastric pain and bulging. She had a previous procedure with a trocar site in this area may have a notable bulge behind this area. While I cannot attribute all of her symptomatology to the hernia we did discuss hernia repair. Risks benefits alternatives for repair were discussed at length patient including but not limited to risk of bleeding, infection, infection and mesh requiring removal and subsequent repair, and recurrence. Her questions and concerns are addressed the patient was consented for the planned procedure.  Procedure note:  Patient is taken to the or is placed in supine position on the or table time the general anesthetic is a Optician, dispensing. Once patient was asleep she symmetrically intubated by the nurse anesthetist. At this point her abdomen is prepped with DuraPrep solution and draped in standard fashion. A vertical incision was created over the palpable bulge with a 15 blade scalpel. Additional dissection down to subcuticular tissues carried out using electrocautery down to the fascia. The hernia sac is easily identified. The fascial defect is identified and electrocautery utilized to circumferentially dissect the hernia sac off of the fascial defect. There is some omental fat within the defect. The majority this was reduced back into the abdominal cavity. The defect itself measured just over 1.5 cm. I opted to place a central mesh patch to reinforce the area. The underside of the fascia was cleared of any adhesions with blunt digital dissection. The mesh patch was brought to the field was inserted in standard fashion employed. 0  Ethibond sutures were then utilized to close the fascia defect over the plug. The tails of the mesh plug were then placed over the fascia and reinforce again with the 0 Ethibond sutures. As quite pleased with the appearance of the repair. Hemostasis was excellent. The wound is irrigated. In the deep subcuticular tissues reapproximated using a 3-0 Vicryl. The skin edges were reapproximated using a 4-0 Monocryl in a running subcuticular suture. The skin was washed dried moist dry towel after the local anesthetic had been instilled. Benzoin is applied around incision. Half-inch are suture placed. The drapes removed the patient left general anesthetic and stretcher the PACU in stable condition. At the conclusion of procedure all instrument, sponge, needle counts are correct. Patient tolerated procedure extremely well.  Complications:  None  EBL:  Minimal  Specimen:  None

## 2011-10-01 NOTE — Discharge Instructions (Signed)
Hernia Repair  Care After  These instructions give you information on caring for yourself after your procedure. Your doctor may also give you more specific instructions. Call your doctor if you have any problems or questions after your procedure.  HOME CARE     You may have changes in your poops (bowel movements).    You may have loose or watery poop (diarrhea).    You may be not able to poop.    Your bowels will slowly get back to normal.    Do not eat any food that makes you sick to your stomach (nauseous). Eat small meals 4 to 6 times a day instead of 3 large ones.    Do not drink pop. It will give you gas.    Do not drink alcohol.    Do not lift anything heavier than 10 pounds. This is about the weight of a gallon of milk.    Do not do anything that makes you very tired for at least 6 weeks.    Do not get your wound wet for 2 days.    You may take a sponge bath during this time.    After 2 days you may take a shower. Gently pat your surgical cut (incision) dry with a towel. Do not rub it.    For men: You may have been given an athletic supporter (scrotal support) before you left the hospital. It holds your scrotum and testicles closer to your body so there is no strain on your wound. Wear the supporter until your doctor tells you that you do not need it anymore.   GET HELP RIGHT AWAY IF:   You have watery poop, or cannot poop for more than 3 days.    You feel sick to your stomach or throw up (vomit) more than 2 or 3 times.    You have temperature by mouth above 102 F (38.9 C).    You see redness or puffiness (swelling) around your wound.    You see yellowish white fluid (pus) coming from your wound.    You see a bulge or bump in your lower belly (abdomen) or near your groin.    You develop a rash, trouble breathing, or any other symptoms from medicines taken.   MAKE SURE YOU:   Understand these instructions.    Will watch your condition.     Will get help right away if your are not doing well or get worse.   Document Released: 03/20/2008 Document Revised: 03/27/2011 Document Reviewed: 03/20/2008  [employer] Patient Information 2012 [employer]  Hernia Repair Care After These instructions give you information on caring for yourself after your procedure. Your doctor may also give you more specific instructions. Call your doctor if you have any problems or questions after your procedure. HOME CARE   You may have changes in your poops (bowel movements).   You may have loose or watery poop (diarrhea).   You may be not able to poop.   Your bowels will slowly get back to normal.   Do not eat any food that makes you sick to your stomach (nauseous). Eat small meals 4 to 6 times a day instead of 3 large ones.   Do not drink pop. It will give you gas.   Do not drink alcohol.   Do not lift anything heavier than 10 pounds. This is about the weight of a gallon of milk.   Do not do anything that makes you very tired for at least 6 weeks.   Do not get your wound wet for 2 days.   You may take a sponge bath during this time.   After 2 days you may take a shower. Gently pat your surgical cut (incision) dry with a towel. Do not rub it.   For men: You may have been given an athletic supporter (scrotal support) before you left the hospital. It holds your scrotum and testicles closer to your body so there is no strain on your wound. Wear the supporter until your doctor tells you that you do not need it anymore.  GET HELP RIGHT AWAY IF:  You have watery poop, or cannot poop for more than 3 days.   You feel sick to your stomach or throw up (vomit) more than 2 or 3 times.   You have temperature by mouth above 102 F (38.9 C).   You see redness or puffiness (swelling) around your wound.   You see yellowish white fluid (pus) coming from your wound.   You see a bulge or bump in your lower belly (abdomen) or near your groin.   You develop a rash, trouble breathing, or any other symptoms from medicines taken.  MAKE SURE YOU:  Understand these instructions.   Will watch your condition.   Will get help right away if your are not  doing well or get worse.  Document Released: 03/20/2008 Document Revised: 03/27/2011 Document Reviewed: 03/20/2008 [facility] Patient Information 2012 [place], [place].

## 2011-10-01 NOTE — Progress Notes (Signed)
Discussed pt's pain and condition with Dr Leticia Penna. States she can go home and take regular meds including valium and prescribed pain med.

## 2011-10-01 NOTE — H&P (Signed)
NTS SOAP Note  Vital Signs:  Vitals as of: 07/29/2011: Systolic 141: Diastolic 92: Heart Rate 100: Temp 96.27F: Height 5ft 3in: Weight 152Lbs 0 Ounces: OFC 0in: Respiratory Rate 0: O2 Saturation 0: Pain Level 5: BMI 27  BMI : 26.93 kg/m2  Subjective: This 51 Years 2 Months old Female presents forof epigastric pain.  Patient states that she has had an inceasing bulge in her epigastric region. She has discomfort in this area. Occasionally has burning pain over this area. No fevers chills or nausea or vomiting. she has had a laparoscopic cholecystectomy. She states that the bulge/hernia has been there prior to her surgery.  Review of Symptoms:  Constitutional:unremarkable Head:unremarkable Eyes:unremarkable Nose/Mouth/Throat:unremarkable Cardiovascular:unremarkable Respiratory:unremarkable as per history of present illness Genitourinary:unremarkable arthralgias Skin:unremarkable Breast:unremarkable Hematolgic/Lymphatic:unremarkable Allergic/Immunologic:unremarkable   Past Medical History:Obtained   Past Medical History  Pregnancy Gravida: 2 Pregnancy Para: 2 Surgical History: chole Medical Problems: chronic pain   Social History:Obtained  Social History  Preferred Language: English ([place]) Race:  White Ethnicity: Not Hispanic / Latino Age: 51 Years 2 Months Marital Status:  W   Smoking Status: Unknown if ever smoked  Family History:Obtained   Family History      Not Available    Objective Information: General:Well appearing, well nourished in no distress. Skin:no rash or prominent lesions Head:Atraumatic; no masses; no abnormalities Eyes:conjunctiva clear, EOM intact, PERRL Mouth:Mucous membranes moist, no mucosal lesions. Neck:Supple without lymphadenopathy.  Heart:RRR, no murmur Lungs:CTA bilaterally, no wheezes, rhonchi, rales.  Breathing unlabored. Abdomen:Soft, ND, no  HSM, no masses.she does have tenderness in the epigastric region. This is even on soft palpation. Extremities:No deformities, clubbing, cyanosis, or edema.   CT abdomen pelvis from September 2012. There is demonstration of a fascial defect in the epigastrium on this study. approximately 1 cm in width.    Assessment:  Diagnosis &amp; Procedure: DiagnosisCode: 553.20, ProcedureCode: 16109,    Plan: epigastric hernia/incisional hernia. I did discuss at length with the patient risks benefits alternatives of repair. At this time I do feel patient would benefit from closure of the hernia more from the standpoint progression of size and for pain. I am very suspicious that her pain may not necessarily be related to her hernia and I did advise her that repair of her hernia is not guaranteed to relieve her pain symptoms. She does state understanding. We'll schedule at her convenience.  Patient Education:Alternative treatments to surgery were discussed with patient (and family).Risks and benefits  of procedure were fully explained to the patient (and family) who gave informed consent. Patient/family questions were addressed.  Follow-up:Pending Surgery  NTS SOAP Note  Vital Signs:  Vitals as of: 09/25/2011: Systolic 124: Diastolic 68: Heart Rate 98: Temp 98.99F: Height 24ft 3in: Weight 156Lbs 0 Ounces: Pain Level 7: BMI 28  BMI : 27.63 kg/m2  Subjective: This 51 Years 80 Months old Female presents for of continued excruciating epigastric abdominal pain.  patient states that pain in her epigastric region has gotten worse. She says it's increased with certain movements and occasionally with eating. She states she feels like her food is getting stuck in this area. She has continued nausea. No emesis or no regurgitation of undigested food. No change in bowel movements. No melena no hematochezia. She has not been back to see GI recently as patient states the last time she went she was feeling  better. She is not updated her gastroenterologist regarding the changes in her symptomatology. She denies any fevers or chills.  Review of Symptoms:  Constitutional:unremarkable   Head:unremarkable    Eyes:unremarkable   Nose/Mouth/Throat:unremarkable Cardiovascular:  unremarkable   Respiratory:unremarkable       as per history of present illness Genitourinary:unremarkable       joint arthralgia Skin:unremarkable Breast:unremarkable   Hematolgic/Lymphatic:unremarkable     Allergic/Immunologic:unremarkable     Past Medical History:    Reviewed   Past Medical History  Pregnancy Gravida: 2 Pregnancy Para: 2 Surgical History: chole Medical Problems: chronic pain   Social History:Reviewed  Social History  Preferred Language: English ([place]) Race:  White Ethnicity: Not Hispanic / Latino Age: 51 Years 4 Months Marital Status:  W   Smoking Status: Unknown if ever smoked  Family History:  Reviewed   Family History      Not Available    Objective Information: General:  Well appearing, well nourished in no distress.  anxious appearing  and slightly disheveled. Skin:     no rash or prominent lesions Head:Atraumatic; no masses; no abnormalities Eyes:  conjunctiva clear, EOM intact, PERRL Mouth:  Mucous membranes moist, no mucosal lesions. Neck:  Supple without lymphadenopathy.  Heart:  RRR, no murmur Lungs:    CTA bilaterally, no wheezes, rhonchi, rales.  Breathing unlabored. Abdomen:Soft, ND, no HSM, no masses. exquisitely tender in the epigastric region. This is somewhat distractible in nature.no involuntary guarding. she does have a palpable hernia. This is easily reducible. No increase pain when distracted. no peritoneal signs. Extremities:  No deformities, clubbing, cyanosis, or edema.   Assessment:  Diagnosis &amp; Procedure: DiagnosisCode: 553.20, ProcedureCode: 16109,     Plan: epigastric hernia. I had an extremely long conversation again with the patient regarding the findings. While she does have a hernia this is easily reducible and does not appear to be a transition point for any obstruction. we did discuss common symptoms associated with the hernia. I had a long discussion with the patient that a large number of her symptoms cannot be attributed to the hernia. I did discuss with the patient repair of the hernia and did advise her again as on her previous office visit that this should be fixed surgically.due to her high level of pain I do not feel that she is a good candidate for a laparoscopic approach and did discuss with her and open approachwith the associatedtypical less pain. Patient states that she understands that her symptoms may not improve with repair of the hernia. She understands that the reason for proceeding with the repair of the hernia is to repair the fascial defect to minimize the chance ofhaving incarcerated intra-abdominal contentor possible strangulation. I did discuss with her the possibility of persistent chronic pain. We'll schedule open repair at her convenience.  Patient Education:Alternative treatments to surgery were discussed with patient (and family).  Risks and benefits  of procedure were fully explained to the patient (and family) who gave informed consent. Patient/family questions were addressed.  Follow-up:Pending Surgery

## 2011-10-01 NOTE — Transfer of Care (Signed)
Immediate Anesthesia Transfer of Care Note  Patient: [name]  Procedure(s) Performed: Procedure(s) (LRB): HERNIA REPAIR INCISIONAL (N/A) INSERTION OF MESH (N/A)  Patient Location: PACU  Anesthesia Type: General  Level of Consciousness: awake  Airway & Oxygen Therapy: Patient Spontanous Breathing and non-rebreather face mask  Post-op Assessment: Report given to PACU RN, Post -op Vital signs reviewed and stable and Patient moving all extremities  Post vital signs: Reviewed and stable  Complications: No apparent anesthesia complications

## 2011-10-01 NOTE — Anesthesia Preprocedure Evaluation (Signed)
Anesthesia Evaluation  Patient identified by MRN, date of birth, ID band Patient awake    Reviewed: Allergy & Precautions, H&P , NPO status , Patient's Chart, lab work & pertinent test results  History of Anesthesia Complications Negative for: history of anesthetic complications  Airway Mallampati: II TM Distance: >3 FB     Dental  (+) Teeth Intact   Pulmonary shortness of breath and with exertion, COPD COPD inhaler,    + decreased breath sounds      Cardiovascular hypertension, Pt. on medications Rhythm:Regular Rate:Normal     Neuro/Psych PSYCHIATRIC DISORDERS Anxiety Depression    GI/Hepatic GERD-  Medicated and Poorly Controlled,  Endo/Other    Renal/GU      Musculoskeletal   Abdominal   Peds  Hematology   Anesthesia Other Findings   Reproductive/Obstetrics                           Anesthesia Physical Anesthesia Plan  ASA: III  Anesthesia Plan: General   Post-op Pain Management:    Induction: Intravenous, Rapid sequence and Cricoid pressure planned  Airway Management Planned: Oral ETT  Additional Equipment:   Intra-op Plan:   Post-operative Plan: Extubation in OR  Informed Consent: I have reviewed the patients History and Physical, chart, labs and discussed the procedure including the risks, benefits and alternatives for the proposed anesthesia with the patient or authorized representative who has indicated his/her understanding and acceptance.     Plan Discussed with:   Anesthesia Plan Comments:         Anesthesia Quick Evaluation

## 2011-10-01 NOTE — Anesthesia Postprocedure Evaluation (Signed)
Anesthesia Post Note  Patient: [name]  Procedure(s) Performed: Procedure(s) (LRB): HERNIA REPAIR INCISIONAL (N/A) INSERTION OF MESH (N/A)  Anesthesia type: General  Patient location: PACU  Post pain: Pain level controlled  Post assessment: Post-op Vital signs reviewed, Patient's Cardiovascular Status Stable, Respiratory Function Stable, Patent Airway, No signs of Nausea or vomiting and Pain level controlled  Last Vitals:  Filed Vitals:   10/01/11 1045  BP: 118/64  Pulse: 69  Temp:   Resp: 17    Post vital signs: Reviewed and stable  Level of consciousness: awake and alert   Complications: No apparent anesthesia complications

## 2011-10-02 ENCOUNTER — Encounter (HOSPITAL_COMMUNITY): Payer: Self-pay | Admitting: General Surgery
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
## 2011-11-12 ENCOUNTER — Ambulatory Visit: Payer: BC Managed Care – PPO | Attending: Neurology

## 2011-11-12 DIAGNOSIS — G4733 Obstructive sleep apnea (adult) (pediatric): Secondary | ICD-10-CM | POA: Insufficient documentation

## 2011-11-19 NOTE — Procedures (Signed)
NAME:  [name], [name]                ACCOUNT NO.:  [id number]  MEDICAL RECORD NO.:  [id number]          PATIENT TYPE:  OUT  LOCATION:  SLEEP LAB                     FACILITY:  [facility]  PHYSICIAN:  Jaun Galluzzo A. Gerilyn Pilgrim, M.D. DATE OF BIRTH:  [birth year]  DATE OF STUDY:  11/12/2011                           NOCTURNAL POLYSOMNOGRAM  REFERRING PHYSICIAN:  Corrie Mckusick, M.D.  INDICATION:  A 51 year old, who presents with fatigue, snoring, and drowsiness.  The study is being done to evaluate for obstructive sleep apnea syndrome.  INDICATION FOR STUDY:  EPWORTH SLEEPINESS SCORE:  MEDICATIONS:  Wellbutrin, hydrochlorothiazide, Valium, Protonix, tramadol, Zofran, Ventolin, Advair, Percocet.  EPWORTH SLEEPINESS SCALE:1.   BMI 27.  ARCHITECTURAL SUMMARY:  The total recording time is 405 minutes.  Sleep efficiency 50%.  Sleep latency 78 minutes.  N1 12.4%, N2 86.1%, N3 1.5%, and REM sleep 0%.  RESPIRATORY SUMMARY:  Baseline oxygen saturation is 99, lowest saturation 88.  Diagnostic AHI is 7 and RDI 7.5.  LIMB MOVEMENT SUMMARY:  PLM index 0.  ELECTROCARDIOGRAM SUMMARY:  Average heart rate is 83 with no significant dysrhythmias observed.  IMPRESSION: 1. Mild obstructive sleep apnea syndrome, not requiring positive     pressure treatment. 2. Abnormal sleep architecture with prolonged sleep latency, reduced     sleep efficiency, and absent rapid eye movement sleep.     Cire Clute A. Gerilyn Pilgrim, M.D.    KAD/MEDQ  D:  11/19/2011 09:38:33  T:  11/19/2011 10:07:49  Job:  [id number]

## 2012-01-29 ENCOUNTER — Encounter: Payer: Self-pay | Admitting: *Deleted

## 2012-03-09 ENCOUNTER — Other Ambulatory Visit: Payer: Self-pay | Admitting: Gastroenterology

## 2012-03-09 NOTE — Telephone Encounter (Signed)
Pt needs 6 mo FU

## 2012-03-10 NOTE — Telephone Encounter (Signed)
Reminder in epic to follow up in 6 months °

## 2012-04-07 DIAGNOSIS — J339 Nasal polyp, unspecified: Secondary | ICD-10-CM | POA: Insufficient documentation

## 2012-04-08 ENCOUNTER — Other Ambulatory Visit: Payer: Self-pay | Admitting: Urgent Care

## 2012-07-20 ENCOUNTER — Other Ambulatory Visit: Payer: Self-pay

## 2012-07-20 MED ORDER — PANTOPRAZOLE SODIUM 40 MG PO TBEC: DELAYED_RELEASE_TABLET | ORAL | Status: DC

## 2012-07-20 NOTE — Telephone Encounter (Signed)
Darl Pikes she needs an OV in 4 months with [facility]

## 2012-07-20 NOTE — Telephone Encounter (Signed)
RF X 5. OV with [facility] within next 4 months.

## 2012-07-22 NOTE — Telephone Encounter (Signed)
Reminder in epic to follow up in 4 months °

## 2012-09-02 ENCOUNTER — Encounter: Payer: Self-pay | Admitting: Gastroenterology

## 2012-10-19 ENCOUNTER — Encounter: Payer: Self-pay | Admitting: Gastroenterology

## 2013-03-31 ENCOUNTER — Telehealth: Payer: Self-pay | Admitting: General Practice

## 2013-03-31 NOTE — Telephone Encounter (Signed)
Pt is complaining of nausea with vomiting for about a week.  No bowel movement in about 9 days.  She also has upple lest sided abd pain.  Pt would like to speak with Dr. Darrick Penna.  I explained to the patient that we will have her nurse give her a call.  Routing to [employer]

## 2013-03-31 NOTE — Telephone Encounter (Signed)
I called pt and she said she has had this problem of N/V for a week. She is constipated and has not had a bowel movement for 8 days. She has been eating some soup and toast and drinking some liquids, but just not a lot. She had pain in her right side of her abdomen and sometimes it feels like a little knot. She rates the pain now as a 7.5. She said sometimes the pain gets up to a 9, but she has percocet for her neck pain and zofran for nausea and she uses that some. Please advise!

## 2013-04-01 ENCOUNTER — Other Ambulatory Visit (HOSPITAL_COMMUNITY): Payer: Self-pay | Admitting: Physician Assistant

## 2013-04-01 ENCOUNTER — Ambulatory Visit (HOSPITAL_COMMUNITY)
Admission: RE | Admit: 2013-04-01 | Discharge: 2013-04-01 | Disposition: A | Discharge status: Home / Self Care | Payer: BC Managed Care – PPO | Source: Ambulatory Visit | Attending: Physician Assistant | Admitting: Physician Assistant

## 2013-04-01 DIAGNOSIS — K5900 Constipation, unspecified: Secondary | ICD-10-CM

## 2013-04-01 DIAGNOSIS — R109 Unspecified abdominal pain: Secondary | ICD-10-CM

## 2013-04-07 NOTE — Telephone Encounter (Signed)
Susan, please schedule pt ov. 

## 2013-04-07 NOTE — Telephone Encounter (Signed)
PLEASE CALL PT. SHE AHS NOT BEEN SEEN IN OUR OFFICE IN OVER A YEAR. SHE NEEDS TO SCHEDULE THE FIRST AVAILABLE OPV TO DISCUSS HER SYMPTOMS. OTHERWISE SHE SHOULD SEE HER PCP.

## 2013-04-11 NOTE — Telephone Encounter (Signed)
CS made patient an OV for 1/21 at 2 with SF only

## 2013-04-11 NOTE — Telephone Encounter (Signed)
LMOM to call.

## 2013-05-11 ENCOUNTER — Ambulatory Visit: Payer: BC Managed Care – PPO | Admitting: Gastroenterology

## 2013-05-11 ENCOUNTER — Other Ambulatory Visit (HOSPITAL_COMMUNITY): Payer: Self-pay | Admitting: Physician Assistant

## 2013-05-11 DIAGNOSIS — Z139 Encounter for screening, unspecified: Secondary | ICD-10-CM

## 2013-05-12 ENCOUNTER — Ambulatory Visit (HOSPITAL_COMMUNITY)
Admission: RE | Admit: 2013-05-12 | Discharge: 2013-05-12 | Disposition: A | Discharge status: Home / Self Care | Payer: BC Managed Care – PPO | Source: Ambulatory Visit | Attending: Physician Assistant | Admitting: Physician Assistant

## 2013-05-12 DIAGNOSIS — Z1231 Encounter for screening mammogram for malignant neoplasm of breast: Secondary | ICD-10-CM | POA: Insufficient documentation

## 2013-05-12 DIAGNOSIS — Z139 Encounter for screening, unspecified: Secondary | ICD-10-CM

## 2013-06-01 ENCOUNTER — Ambulatory Visit: Payer: BC Managed Care – PPO | Admitting: Gastroenterology

## 2013-06-02 ENCOUNTER — Ambulatory Visit (INDEPENDENT_AMBULATORY_CARE_PROVIDER_SITE_OTHER): Payer: BC Managed Care – PPO | Admitting: Gastroenterology

## 2013-06-02 ENCOUNTER — Encounter (INDEPENDENT_AMBULATORY_CARE_PROVIDER_SITE_OTHER): Payer: Self-pay

## 2013-06-02 ENCOUNTER — Encounter: Payer: Self-pay | Admitting: Gastroenterology

## 2013-06-02 VITALS — BP 127/75 | HR 99 | Temp 97.4°F | Wt 152.8 lb

## 2013-06-02 DIAGNOSIS — D219 Benign neoplasm of connective and other soft tissue, unspecified: Secondary | ICD-10-CM | POA: Insufficient documentation

## 2013-06-02 DIAGNOSIS — K3184 Gastroparesis: Secondary | ICD-10-CM

## 2013-06-02 DIAGNOSIS — Z1211 Encounter for screening for malignant neoplasm of colon: Secondary | ICD-10-CM | POA: Insufficient documentation

## 2013-06-02 NOTE — Assessment & Plan Note (Signed)
NEXT TCS IN 2018
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
## 2013-06-02 NOTE — Assessment & Plan Note (Signed)
NO SYMPTOMS OF DYSPHAGIA.  EUS WITH DR. Paulita Fujita.

## 2013-06-02 NOTE — Progress Notes (Signed)
Reminder in epic °

## 2013-06-02 NOTE — Assessment & Plan Note (Signed)
SX CONTROLLED WITH DIET.  ZOFRAN S NEEDED. FOLLOW GASTROPARESIS DIET. OPV IN 6-12 MOS.

## 2013-06-02 NOTE — Patient Instructions (Signed)
YOU NEED AN Endoscopic ultrasound of the tumor in your esophagus. I WILL REFER YOU TO DR. Paulita Fujita.  You need COLONOSCOPY in 2018.  Continue PROTONIX.  USE VOLTAREN GEL TO LEFT SIDE FOR 10 DAYS. ICE IN BETWEEN GELL APPLICATIONS FOR 10 MIN THREE TIMES A DAY.  AVOID HEAVY LIFTING OR STRAINING.  FOLLOW UP IN 6-12 MOS.

## 2013-06-03 ENCOUNTER — Other Ambulatory Visit: Payer: Self-pay | Admitting: Gastroenterology

## 2013-06-03 DIAGNOSIS — D219 Benign neoplasm of connective and other soft tissue, unspecified: Secondary | ICD-10-CM

## 2013-06-06 NOTE — Progress Notes (Signed)
cc'd to pcp 

## 2013-06-20 ENCOUNTER — Encounter: Payer: Self-pay | Admitting: *Deleted

## 2013-06-21 ENCOUNTER — Telehealth: Payer: Self-pay

## 2013-06-21 DIAGNOSIS — R1013 Epigastric pain: Secondary | ICD-10-CM

## 2013-06-21 NOTE — Telephone Encounter (Signed)
Pt called and said her upper abdominal pain is getting worse. She sits at the computer and works and it is getting worse for the past week.  She had put off seeing Dr. Ardis Hughs. Has to see OB/GYN tomorrow for a scraping biopsy.  She was crying and said she just does not know what to do first. Michela Pitcher she had spoke to Dr. Oneida Alar about seeing a Psychologist, sport and exercise. She wants to know what Dr. Oneida Alar recommends for her to do first. She is aware that Dr. Oneida Alar is off today and will return tomorrow. Said she will go to the ED if her pain worsens today.

## 2013-06-22 ENCOUNTER — Encounter: Payer: Self-pay | Admitting: Obstetrics & Gynecology

## 2013-06-27 ENCOUNTER — Other Ambulatory Visit: Payer: Self-pay

## 2013-06-27 DIAGNOSIS — R109 Unspecified abdominal pain: Secondary | ICD-10-CM

## 2013-06-27 NOTE — Telephone Encounter (Signed)
I called and spoke with pt. She said she did not think that she could tolerate physical therapy.  She is still hurting a lot and would like for someone to go ahead tomorrow and schedule the CT. She is aware I will send the lab order to [facility] for the Lipase and she will go tomorrow. Said she absolutely could not go today.

## 2013-06-27 NOTE — Telephone Encounter (Signed)
PLEASE CALL PT. I BELIEVE HER UPPER LEFT ABD PAIN IS ABDOMINAL WALL PAIN AND IF VOLTAREN GEL IS NOT HELPING. 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 THE LAST IMAGING OF HER ABDOMEN WAS IN SEP 2012 FOR ABD PAIN NAUSEA AND BLOATING. IF SHE IS STILL HAVING SEVERE ABD PAIN, DR. Byford Schools WILL ORDER A CT ABD/CMP TO COMPLETE THE EVALUATION FOR HER ABDOMINAL PAIN. SHE WILL ALSO NEED PANCREAS ENZYMES(LIPASE) CHECKED.

## 2013-06-28 ENCOUNTER — Other Ambulatory Visit: Payer: Self-pay

## 2013-06-28 DIAGNOSIS — R109 Unspecified abdominal pain: Secondary | ICD-10-CM

## 2013-06-28 NOTE — Telephone Encounter (Signed)
PLEASE CALL PT. THE ORDER FOR CMP AND LIPASE HAVE BEEN ENTERED TO CHECK HER LIVER, KIDNEY FUNCTION, AND PANCREAS. LEIGH WILL CLL HER WITH THE CT APPT ONCE HER INSURANCE COMPANY HAS APPROVED THE ORDER.

## 2013-06-28 NOTE — Telephone Encounter (Signed)
CT is scheduled for Wednesday March 11th at 8:00 am and I have LMOM  For her to return my call

## 2013-06-28 NOTE — Addendum Note (Signed)
Addended by: Danie Binder on: 06/28/2013 07:35 AM   Modules accepted: Orders

## 2013-06-28 NOTE — Telephone Encounter (Signed)
Called and LMOM ( both numbers) for a return call.

## 2013-06-28 NOTE — Telephone Encounter (Signed)
Pt left a VM that she had rescheduled her CT to Thurs. I left VM for her to go to the lab and do blood work.

## 2013-06-29 ENCOUNTER — Ambulatory Visit (HOSPITAL_COMMUNITY): Payer: BC Managed Care – PPO

## 2013-06-30 ENCOUNTER — Ambulatory Visit (HOSPITAL_COMMUNITY)
Admission: RE | Admit: 2013-06-30 | Discharge: 2013-06-30 | Disposition: A | Discharge status: Home / Self Care | Payer: BC Managed Care – PPO | Source: Ambulatory Visit | Attending: Gastroenterology | Admitting: Gastroenterology

## 2013-06-30 DIAGNOSIS — I7090 Unspecified atherosclerosis: Secondary | ICD-10-CM | POA: Insufficient documentation

## 2013-06-30 DIAGNOSIS — R109 Unspecified abdominal pain: Secondary | ICD-10-CM | POA: Insufficient documentation

## 2013-06-30 DIAGNOSIS — R1013 Epigastric pain: Secondary | ICD-10-CM

## 2013-06-30 DIAGNOSIS — Q638 Other specified congenital malformations of kidney: Secondary | ICD-10-CM | POA: Insufficient documentation

## 2013-06-30 MED ORDER — IOHEXOL 300 MG/ML  SOLN
100.0000 mL | Freq: Once | INTRAMUSCULAR | Status: AC | PRN
  Administered 2013-06-30: 100 mL via INTRAVENOUS

## 2013-06-30 NOTE — Telephone Encounter (Signed)
PLEASE CALL PT. HER CT IS NORMAL EXCEPT SHE HAS HARDENING OF THE ARTERIES. THE ARTERIES THAT LEAD TO HER LEGS ARE NARROW. SHE SHOULD STOP SMOKING BECAUSE THAT IS GOING TO MAKE IT GET WORSE FASTER. IF IT GETS WORSE IT COULD LEAD TO CLOGGED ARTERIES IN HER LEGS. NO SOURCE FOR ABDOMINAL PAIN WAS IDENTIFIED. IT'S MOST LIKELY DUE TO ABD WALL PAIN.

## 2013-07-01 NOTE — Telephone Encounter (Signed)
Pt called and was informed. She is concerned and wants to know if she should see a cardiologist about this.

## 2013-07-01 NOTE — Telephone Encounter (Addendum)
PLEASE CALL PT. If she is not having any leg pain, she does not need to see a vascular surgeon. THEY manage peripheral vascular disease. IF SHE WANTS TO TALK TO THEM ABOUT HOW TO KEEP IT FROM GETTING WORSE, SHE CAN BE REFERRED TO DR. TODD EARLY IN [place].

## 2013-07-01 NOTE — Telephone Encounter (Signed)
LMOM to call.

## 2013-07-04 NOTE — Telephone Encounter (Signed)
LMOM to call.

## 2013-07-04 NOTE — Telephone Encounter (Signed)
I called and informed pt. She said OK to schedule the appt with Dr. Curt Jews.  She also said that she will do her labs tomorrow. Her son is coming in from [facility] and will take her.  She has had problems in the past when she has blood drawn with dizziness and even syncope.  She has not got to schedule the EUS yet, but will try to soon.  She is crying on the phone and asked me was there anything that Dr. Oneida Alar could do about her abdominal wall pain. She said she hurts so bad in her stomach and through her ribs.  Please advise!

## 2013-07-04 NOTE — Telephone Encounter (Addendum)
PLEASE CALL PT. I BELIEVE HER UPPER LEFT ABD PAIN IS ABDOMINAL WALL PAIN AND IF VOLTAREN GEL IS NOT HELPING. 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 IF SHE NEEDS ADDITIONAL PAIN MEDS, SHE SHOULD SEE DR. GOLDING.

## 2013-07-04 NOTE — Telephone Encounter (Signed)
Called home and line busy.

## 2013-07-04 NOTE — Telephone Encounter (Signed)
Called and informed pt.  

## 2013-07-05 ENCOUNTER — Encounter (INDEPENDENT_AMBULATORY_CARE_PROVIDER_SITE_OTHER): Payer: Self-pay

## 2013-07-05 ENCOUNTER — Ambulatory Visit (INDEPENDENT_AMBULATORY_CARE_PROVIDER_SITE_OTHER): Payer: BC Managed Care – PPO | Admitting: Obstetrics & Gynecology

## 2013-07-05 ENCOUNTER — Encounter: Payer: Self-pay | Admitting: Obstetrics & Gynecology

## 2013-07-05 VITALS — BP 100/70 | Ht 62.4 in | Wt 149.0 lb

## 2013-07-05 DIAGNOSIS — R87612 Low grade squamous intraepithelial lesion on cytologic smear of cervix (LGSIL): Secondary | ICD-10-CM

## 2013-07-05 DIAGNOSIS — N870 Mild cervical dysplasia: Secondary | ICD-10-CM | POA: Insufficient documentation

## 2013-07-05 DIAGNOSIS — R87810 Cervical high risk human papillomavirus (HPV) DNA test positive: Secondary | ICD-10-CM

## 2013-07-05 NOTE — Progress Notes (Signed)
Patient ID: [name], female   DOB: [birth year], 53 y.o.   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

## 2013-07-06 LAB — COMPREHENSIVE METABOLIC PANEL
ALBUMIN: 4.3 g/dL (ref 3.5–5.2)
ALT: 13 U/L (ref 0–35)
AST: 13 U/L (ref 0–37)
Alkaline Phosphatase: 82 U/L (ref 39–117)
BUN: 9 mg/dL (ref 6–23)
CHLORIDE: 99 meq/L (ref 96–112)
CO2: 31 meq/L (ref 19–32)
CREATININE: 1.16 mg/dL — AB (ref 0.50–1.10)
Calcium: 9.6 mg/dL (ref 8.4–10.5)
GLUCOSE: 94 mg/dL (ref 70–99)
POTASSIUM: 4.3 meq/L (ref 3.5–5.3)
Sodium: 134 mEq/L — ABNORMAL LOW (ref 135–145)
Total Bilirubin: 0.4 mg/dL (ref 0.2–1.2)
Total Protein: 6.9 g/dL (ref 6.0–8.3)

## 2013-07-06 LAB — LIPASE: Lipase: 21 U/L (ref 0–75)

## 2013-07-11 ENCOUNTER — Other Ambulatory Visit: Payer: Self-pay | Admitting: Gastroenterology

## 2013-07-11 DIAGNOSIS — I739 Peripheral vascular disease, unspecified: Secondary | ICD-10-CM

## 2013-07-11 NOTE — Telephone Encounter (Signed)
Pt called back. She has decided to get an appt with Dr. Sherren Mocha Early for consult per Dr. Nona Dell recommendation.  She would need a Monday or a Tues.

## 2013-07-11 NOTE — Telephone Encounter (Signed)
Pt had left Vm on Friday that she would like to have a referral to [facility] to a vascular surgeon.   Please advise!

## 2013-07-11 NOTE — Telephone Encounter (Addendum)
REVIEWED. PLEASE CALL PT. HER LIVER AND PANCREAS TESTS ARE NORMAL.

## 2013-07-11 NOTE — Telephone Encounter (Signed)
Referral has been made to Dr. Curt Jews, they will contact Mrs. Deyton with her Date & Time

## 2013-07-11 NOTE — Telephone Encounter (Signed)
Pt is aware of the labs and that referral has been made to Dr. Donnetta Hutching.

## 2013-07-26 ENCOUNTER — Encounter: Payer: BC Managed Care – PPO | Admitting: Vascular Surgery

## 2013-08-15 ENCOUNTER — Encounter: Payer: Self-pay | Admitting: Vascular Surgery

## 2013-08-16 ENCOUNTER — Encounter: Payer: Self-pay | Admitting: Vascular Surgery

## 2013-08-16 ENCOUNTER — Ambulatory Visit (INDEPENDENT_AMBULATORY_CARE_PROVIDER_SITE_OTHER): Payer: BC Managed Care – PPO | Admitting: Vascular Surgery

## 2013-08-16 VITALS — BP 149/96 | HR 96 | Resp 16 | Ht 63.0 in | Wt 146.0 lb

## 2013-08-16 DIAGNOSIS — I700 Atherosclerosis of aorta: Secondary | ICD-10-CM | POA: Insufficient documentation
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
## 2013-09-06 DIAGNOSIS — H9190 Unspecified hearing loss, unspecified ear: Secondary | ICD-10-CM | POA: Insufficient documentation

## 2013-09-21 ENCOUNTER — Telehealth: Payer: Self-pay | Admitting: Gastroenterology

## 2013-09-21 NOTE — Telephone Encounter (Signed)
Christina from Dr. Lavetta Nielsen office called to state that Mrs. Haigler cancelled her EUS next week with Dr. Paulita Fujita, stated that no one was addressing her abdominal pain. Margreta Journey advised her to continue with the EUS for further recommendations and Mrs. Augustine has refused.

## 2013-09-22 NOTE — Telephone Encounter (Signed)
REVIEWED.   PLEASE CALL PT. She cancelled her appt with Dr. Paulita Fujita. I do not have any additional procedures or recommendations regarding her abdominal pain. She should let me if she wants to be referred to [place], [place] for a second opinion.

## 2013-09-28 ENCOUNTER — Encounter (HOSPITAL_COMMUNITY): Admission: RE | Payer: Self-pay | Source: Ambulatory Visit

## 2013-09-28 ENCOUNTER — Ambulatory Visit (HOSPITAL_COMMUNITY)
Admission: RE | Admit: 2013-09-28 | Payer: BC Managed Care – PPO | Source: Ambulatory Visit | Admitting: Gastroenterology

## 2013-09-28 SURGERY — ESOPHAGEAL ENDOSCOPIC ULTRASOUND (EUS) RADIAL
Anesthesia: Monitor Anesthesia Care

## 2013-09-28 NOTE — Telephone Encounter (Signed)
I called and told pt and she said she already had an appt somewhere .

## 2013-11-28 ENCOUNTER — Encounter: Payer: Self-pay | Admitting: Gastroenterology

## 2014-01-05 ENCOUNTER — Ambulatory Visit: Payer: BC Managed Care – PPO | Admitting: Gastroenterology

## 2014-07-27 ENCOUNTER — Other Ambulatory Visit (HOSPITAL_COMMUNITY): Payer: Self-pay | Admitting: Physician Assistant

## 2014-07-27 DIAGNOSIS — Z1231 Encounter for screening mammogram for malignant neoplasm of breast: Secondary | ICD-10-CM

## 2014-08-09 ENCOUNTER — Ambulatory Visit (HOSPITAL_COMMUNITY): Payer: Self-pay

## 2014-08-10 ENCOUNTER — Ambulatory Visit (HOSPITAL_COMMUNITY)
Admission: RE | Admit: 2014-08-10 | Discharge: 2014-08-10 | Disposition: A | Discharge status: Home / Self Care | Payer: 59 | Source: Ambulatory Visit | Attending: Physician Assistant | Admitting: Physician Assistant

## 2014-08-10 ENCOUNTER — Other Ambulatory Visit (HOSPITAL_COMMUNITY): Payer: Self-pay | Admitting: Physician Assistant

## 2014-08-10 ENCOUNTER — Ambulatory Visit (HOSPITAL_COMMUNITY): Payer: Self-pay

## 2014-08-10 DIAGNOSIS — R0781 Pleurodynia: Secondary | ICD-10-CM

## 2014-11-13 ENCOUNTER — Ambulatory Visit (HOSPITAL_COMMUNITY): Payer: 59

## 2015-04-06 ENCOUNTER — Emergency Department
Admission: EM | Admit: 2015-04-06 | Discharge: 2015-04-06 | Disposition: A | Discharge status: Home / Self Care | Payer: 59 | Attending: Emergency Medicine | Admitting: Emergency Medicine

## 2015-04-06 ENCOUNTER — Encounter: Payer: Self-pay | Admitting: Emergency Medicine

## 2015-04-06 DIAGNOSIS — M79605 Pain in left leg: Secondary | ICD-10-CM | POA: Diagnosis present

## 2015-04-06 DIAGNOSIS — I10 Essential (primary) hypertension: Secondary | ICD-10-CM | POA: Diagnosis not present

## 2015-04-06 DIAGNOSIS — F17210 Nicotine dependence, cigarettes, uncomplicated: Secondary | ICD-10-CM | POA: Insufficient documentation

## 2015-04-06 DIAGNOSIS — Z79899 Other long term (current) drug therapy: Secondary | ICD-10-CM | POA: Insufficient documentation

## 2015-04-06 DIAGNOSIS — Z7952 Long term (current) use of systemic steroids: Secondary | ICD-10-CM | POA: Diagnosis not present

## 2015-04-06 DIAGNOSIS — Z7951 Long term (current) use of inhaled steroids: Secondary | ICD-10-CM | POA: Insufficient documentation

## 2015-04-06 DIAGNOSIS — I7300 Raynaud's syndrome without gangrene: Secondary | ICD-10-CM | POA: Insufficient documentation

## 2015-04-06 LAB — CBC WITH DIFFERENTIAL/PLATELET
Basophils Absolute: 0.1 K/uL (ref 0–0.1)
Basophils Relative: 1 %
Eosinophils Absolute: 0 K/uL (ref 0–0.7)
Eosinophils Relative: 0 %
HCT: 43.4 % (ref 35.0–47.0)
Hemoglobin: 14.3 g/dL (ref 12.0–16.0)
Lymphocytes Relative: 17 %
Lymphs Abs: 2.2 K/uL (ref 1.0–3.6)
MCH: 31.9 pg (ref 26.0–34.0)
MCHC: 33 g/dL (ref 32.0–36.0)
MCV: 96.7 fL (ref 80.0–100.0)
Monocytes Absolute: 0.4 K/uL (ref 0.2–0.9)
Monocytes Relative: 3 %
Neutro Abs: 10.3 K/uL — ABNORMAL HIGH (ref 1.4–6.5)
Neutrophils Relative %: 79 %
Platelets: 293 K/uL (ref 150–440)
RBC: 4.49 MIL/uL (ref 3.80–5.20)
RDW: 12.3 % (ref 11.5–14.5)
WBC: 13 K/uL — ABNORMAL HIGH (ref 3.6–11.0)

## 2015-04-06 LAB — BASIC METABOLIC PANEL
ANION GAP: 10 (ref 5–15)
BUN: 17 mg/dL (ref 6–20)
CALCIUM: 9.3 mg/dL (ref 8.9–10.3)
CO2: 25 mmol/L (ref 22–32)
Chloride: 103 mmol/L (ref 101–111)
Creatinine, Ser: 0.9 mg/dL (ref 0.44–1.00)
Glucose, Bld: 109 mg/dL — ABNORMAL HIGH (ref 65–99)
POTASSIUM: 3.9 mmol/L (ref 3.5–5.1)
SODIUM: 138 mmol/L (ref 135–145)

## 2015-04-06 LAB — SEDIMENTATION RATE: Sed Rate: 9 mm/hr (ref 0–30)

## 2015-04-06 MED ORDER — AMLODIPINE BESYLATE 2.5 MG PO TABS: 2.5000 mg | ORAL_TABLET | Freq: Every day | ORAL | Status: DC

## 2015-04-06 NOTE — Discharge Instructions (Signed)

## 2015-04-06 NOTE — ED Notes (Signed)
States she developed some pain to both feet about 3 weeks ago w/o injury. But now states her left foot is blue intermittently. Pain/numbness mainly at 4th and 5th toes
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
## 2015-04-06 NOTE — ED Notes (Signed)
Pt c/o bilateral foot pain at her toes. Pt states her toes have been cold, also c/o back pain. Tearful in triage.

## 2015-04-22 DIAGNOSIS — I219 Acute myocardial infarction, unspecified: Secondary | ICD-10-CM

## 2015-04-22 HISTORY — DX: Acute myocardial infarction, unspecified: I21.9

## 2015-12-07 ENCOUNTER — Other Ambulatory Visit (HOSPITAL_COMMUNITY): Payer: Self-pay | Admitting: Family Medicine

## 2015-12-07 DIAGNOSIS — Z1231 Encounter for screening mammogram for malignant neoplasm of breast: Secondary | ICD-10-CM

## 2015-12-31 ENCOUNTER — Ambulatory Visit (HOSPITAL_COMMUNITY): Payer: Self-pay

## 2016-01-30 ENCOUNTER — Ambulatory Visit (HOSPITAL_COMMUNITY)
Admission: RE | Admit: 2016-01-30 | Discharge: 2016-01-30 | Disposition: A | Discharge status: Home / Self Care | Payer: BLUE CROSS/BLUE SHIELD | Source: Ambulatory Visit | Attending: Family Medicine | Admitting: Family Medicine

## 2016-01-30 DIAGNOSIS — Z1231 Encounter for screening mammogram for malignant neoplasm of breast: Secondary | ICD-10-CM

## 2016-04-01 DIAGNOSIS — I252 Old myocardial infarction: Secondary | ICD-10-CM | POA: Insufficient documentation

## 2016-04-01 DIAGNOSIS — R42 Dizziness and giddiness: Secondary | ICD-10-CM | POA: Insufficient documentation

## 2016-04-03 DIAGNOSIS — I2510 Atherosclerotic heart disease of native coronary artery without angina pectoris: Secondary | ICD-10-CM | POA: Insufficient documentation

## 2016-04-03 DIAGNOSIS — K219 Gastro-esophageal reflux disease without esophagitis: Secondary | ICD-10-CM | POA: Insufficient documentation

## 2016-04-05 DIAGNOSIS — I420 Dilated cardiomyopathy: Secondary | ICD-10-CM | POA: Insufficient documentation

## 2016-04-05 DIAGNOSIS — E785 Hyperlipidemia, unspecified: Secondary | ICD-10-CM | POA: Insufficient documentation

## 2016-04-09 ENCOUNTER — Observation Stay: Payer: BLUE CROSS/BLUE SHIELD

## 2016-04-09 ENCOUNTER — Inpatient Hospital Stay
Admission: AD | Admit: 2016-04-09 | Discharge: 2016-04-14 | DRG: 391 | Disposition: A | Discharge status: Home Health | Payer: BLUE CROSS/BLUE SHIELD | Source: Ambulatory Visit | Attending: Internal Medicine | Admitting: Internal Medicine

## 2016-04-09 ENCOUNTER — Encounter: Payer: Self-pay | Admitting: Student

## 2016-04-09 DIAGNOSIS — Z825 Family history of asthma and other chronic lower respiratory diseases: Secondary | ICD-10-CM

## 2016-04-09 DIAGNOSIS — Z7951 Long term (current) use of inhaled steroids: Secondary | ICD-10-CM

## 2016-04-09 DIAGNOSIS — E43 Unspecified severe protein-calorie malnutrition: Secondary | ICD-10-CM | POA: Insufficient documentation

## 2016-04-09 DIAGNOSIS — R109 Unspecified abdominal pain: Secondary | ICD-10-CM

## 2016-04-09 DIAGNOSIS — I5020 Unspecified systolic (congestive) heart failure: Secondary | ICD-10-CM | POA: Diagnosis present

## 2016-04-09 DIAGNOSIS — R2681 Unsteadiness on feet: Secondary | ICD-10-CM

## 2016-04-09 DIAGNOSIS — Z79899 Other long term (current) drug therapy: Secondary | ICD-10-CM

## 2016-04-09 DIAGNOSIS — Z955 Presence of coronary angioplasty implant and graft: Secondary | ICD-10-CM

## 2016-04-09 DIAGNOSIS — K529 Noninfective gastroenteritis and colitis, unspecified: Secondary | ICD-10-CM | POA: Diagnosis present

## 2016-04-09 DIAGNOSIS — R112 Nausea with vomiting, unspecified: Secondary | ICD-10-CM | POA: Diagnosis present

## 2016-04-09 DIAGNOSIS — F17210 Nicotine dependence, cigarettes, uncomplicated: Secondary | ICD-10-CM | POA: Diagnosis present

## 2016-04-09 DIAGNOSIS — I255 Ischemic cardiomyopathy: Secondary | ICD-10-CM | POA: Diagnosis present

## 2016-04-09 DIAGNOSIS — E860 Dehydration: Secondary | ICD-10-CM | POA: Diagnosis present

## 2016-04-09 DIAGNOSIS — N179 Acute kidney failure, unspecified: Secondary | ICD-10-CM | POA: Diagnosis present

## 2016-04-09 DIAGNOSIS — F419 Anxiety disorder, unspecified: Secondary | ICD-10-CM | POA: Diagnosis present

## 2016-04-09 DIAGNOSIS — I2510 Atherosclerotic heart disease of native coronary artery without angina pectoris: Secondary | ICD-10-CM | POA: Diagnosis present

## 2016-04-09 DIAGNOSIS — Z888 Allergy status to other drugs, medicaments and biological substances status: Secondary | ICD-10-CM

## 2016-04-09 DIAGNOSIS — F5089 Other specified eating disorder: Secondary | ICD-10-CM | POA: Diagnosis present

## 2016-04-09 DIAGNOSIS — R110 Nausea: Secondary | ICD-10-CM

## 2016-04-09 DIAGNOSIS — K3184 Gastroparesis: Secondary | ICD-10-CM | POA: Diagnosis present

## 2016-04-09 DIAGNOSIS — I110 Hypertensive heart disease with heart failure: Secondary | ICD-10-CM | POA: Diagnosis present

## 2016-04-09 DIAGNOSIS — Z91013 Allergy to seafood: Secondary | ICD-10-CM

## 2016-04-09 DIAGNOSIS — I252 Old myocardial infarction: Secondary | ICD-10-CM

## 2016-04-09 DIAGNOSIS — F329 Major depressive disorder, single episode, unspecified: Secondary | ICD-10-CM | POA: Diagnosis present

## 2016-04-09 DIAGNOSIS — J449 Chronic obstructive pulmonary disease, unspecified: Secondary | ICD-10-CM | POA: Diagnosis present

## 2016-04-09 DIAGNOSIS — Z791 Long term (current) use of non-steroidal anti-inflammatories (NSAID): Secondary | ICD-10-CM

## 2016-04-09 DIAGNOSIS — Z9109 Other allergy status, other than to drugs and biological substances: Secondary | ICD-10-CM

## 2016-04-09 DIAGNOSIS — A084 Viral intestinal infection, unspecified: Principal | ICD-10-CM | POA: Diagnosis present

## 2016-04-09 DIAGNOSIS — Z8249 Family history of ischemic heart disease and other diseases of the circulatory system: Secondary | ICD-10-CM

## 2016-04-09 DIAGNOSIS — Z9049 Acquired absence of other specified parts of digestive tract: Secondary | ICD-10-CM

## 2016-04-09 DIAGNOSIS — R262 Difficulty in walking, not elsewhere classified: Secondary | ICD-10-CM

## 2016-04-09 DIAGNOSIS — K219 Gastro-esophageal reflux disease without esophagitis: Secondary | ICD-10-CM | POA: Diagnosis present

## 2016-04-09 DIAGNOSIS — R1115 Cyclical vomiting syndrome unrelated to migraine: Secondary | ICD-10-CM

## 2016-04-09 HISTORY — DX: Acute myocardial infarction, unspecified: I21.9

## 2016-04-09 LAB — BASIC METABOLIC PANEL
Anion gap: 9 (ref 5–15)
BUN: 9 mg/dL (ref 6–20)
CALCIUM: 9.2 mg/dL (ref 8.9–10.3)
CO2: 22 mmol/L (ref 22–32)
Chloride: 107 mmol/L (ref 101–111)
Creatinine, Ser: 1.09 mg/dL — ABNORMAL HIGH (ref 0.44–1.00)
GFR calc Af Amer: >60 mL/min (ref >60)
GFR, EST NON AFRICAN AMERICAN: 56 mL/min — AB (ref >60)
GLUCOSE: 89 mg/dL (ref 65–99)
POTASSIUM: 3.8 mmol/L (ref 3.5–5.1)
SODIUM: 138 mmol/L (ref 135–145)

## 2016-04-09 LAB — CBC
HCT: 38.9 % (ref 35.0–47.0)
Hemoglobin: 13.7 g/dL (ref 12.0–16.0)
MCH: 33.9 pg (ref 26.0–34.0)
MCHC: 35.1 g/dL (ref 32.0–36.0)
MCV: 96.6 fL (ref 80.0–100.0)
PLATELETS: 329 10*3/uL (ref 150–440)
RBC: 4.03 MIL/uL (ref 3.80–5.20)
RDW: 13.1 % (ref 11.5–14.5)
WBC: 9.8 10*3/uL (ref 3.6–11.0)

## 2016-04-09 LAB — MAGNESIUM: MAGNESIUM: 1.7 mg/dL (ref 1.7–2.4)

## 2016-04-09 MED ORDER — ATORVASTATIN CALCIUM 20 MG PO TABS
80.0000 mg | ORAL_TABLET | Freq: Every day | ORAL | Status: DC
  Administered 2016-04-09 – 2016-04-13 (×5): 80 mg via ORAL
  Filled 2016-04-09 (×4): qty 4

## 2016-04-09 MED ORDER — BUTALBITAL-APAP-CAFFEINE 50-325-40 MG PO TABS
1.0000 | ORAL_TABLET | ORAL | Status: DC | PRN
  Administered 2016-04-09 – 2016-04-12 (×3): 1 via ORAL
  Filled 2016-04-09 (×3): qty 1

## 2016-04-09 MED ORDER — KETOROLAC TROMETHAMINE 30 MG/ML IJ SOLN
30.0000 mg | Freq: Once | INTRAMUSCULAR | Status: AC
  Administered 2016-04-09: 30 mg via INTRAVENOUS
  Filled 2016-04-09: qty 1

## 2016-04-09 MED ORDER — SODIUM CHLORIDE 0.9 % IV SOLN
INTRAVENOUS | Status: DC
  Administered 2016-04-09 – 2016-04-10 (×2): via INTRAVENOUS

## 2016-04-09 MED ORDER — ENOXAPARIN SODIUM 40 MG/0.4ML ~~LOC~~ SOLN
40.0000 mg | SUBCUTANEOUS | Status: DC
  Administered 2016-04-09 – 2016-04-13 (×5): 40 mg via SUBCUTANEOUS
  Filled 2016-04-09 (×5): qty 0.4

## 2016-04-09 MED ORDER — ONDANSETRON HCL 4 MG/2ML IJ SOLN
4.0000 mg | Freq: Four times a day (QID) | INTRAMUSCULAR | Status: DC | PRN
  Administered 2016-04-09 – 2016-04-14 (×6): 4 mg via INTRAVENOUS
  Filled 2016-04-09 (×6): qty 2

## 2016-04-09 MED ORDER — CLOPIDOGREL BISULFATE 75 MG PO TABS
75.0000 mg | ORAL_TABLET | Freq: Every day | ORAL | Status: DC
  Administered 2016-04-09 – 2016-04-14 (×6): 75 mg via ORAL
  Filled 2016-04-09 (×6): qty 1

## 2016-04-09 MED ORDER — IPRATROPIUM BROMIDE 0.02 % IN SOLN
500.0000 ug | Freq: Four times a day (QID) | RESPIRATORY_TRACT | Status: DC | PRN
Start: 2016-04-09 — End: 2016-04-14
  Administered 2016-04-09 – 2016-04-10 (×2): 500 ug via RESPIRATORY_TRACT
  Filled 2016-04-09 (×3): qty 2.5

## 2016-04-09 MED ORDER — SODIUM CHLORIDE 0.9% FLUSH
3.0000 mL | Freq: Two times a day (BID) | INTRAVENOUS | Status: DC
  Administered 2016-04-09 – 2016-04-14 (×9): 3 mL via INTRAVENOUS

## 2016-04-09 MED ORDER — ASPIRIN EC 325 MG PO TBEC
325.0000 mg | DELAYED_RELEASE_TABLET | Freq: Every day | ORAL | Status: DC
  Administered 2016-04-09 – 2016-04-10 (×2): 325 mg via ORAL
  Filled 2016-04-09 (×2): qty 1

## 2016-04-09 MED ORDER — METOCLOPRAMIDE HCL 5 MG/ML IJ SOLN
10.0000 mg | Freq: Once | INTRAMUSCULAR | Status: AC
  Administered 2016-04-09: 10 mg via INTRAVENOUS
  Filled 2016-04-09: qty 2

## 2016-04-09 MED ORDER — ACETAMINOPHEN 650 MG RE SUPP: 650.0000 mg | Freq: Four times a day (QID) | RECTAL | Status: DC | PRN

## 2016-04-09 MED ORDER — SENNOSIDES-DOCUSATE SODIUM 8.6-50 MG PO TABS: 1.0000 | ORAL_TABLET | Freq: Every evening | ORAL | Status: DC | PRN

## 2016-04-09 MED ORDER — ALUM & MAG HYDROXIDE-SIMETH 200-200-20 MG/5ML PO SUSP
15.0000 mL | ORAL | Status: DC | PRN
  Administered 2016-04-09: 23:00:00 15 mL via ORAL
  Filled 2016-04-09: qty 30

## 2016-04-09 MED ORDER — ACETAMINOPHEN 325 MG PO TABS
650.0000 mg | ORAL_TABLET | Freq: Four times a day (QID) | ORAL | Status: DC | PRN
  Administered 2016-04-13: 06:00:00 650 mg via ORAL
  Filled 2016-04-09: qty 2

## 2016-04-09 MED ORDER — METOPROLOL TARTRATE 25 MG PO TABS
12.5000 mg | ORAL_TABLET | Freq: Two times a day (BID) | ORAL | Status: DC
  Administered 2016-04-09 – 2016-04-10 (×3): 12.5 mg via ORAL
  Filled 2016-04-09 (×3): qty 1

## 2016-04-09 MED ORDER — ONDANSETRON HCL 4 MG PO TABS
4.0000 mg | ORAL_TABLET | Freq: Four times a day (QID) | ORAL | Status: DC | PRN
  Administered 2016-04-11 – 2016-04-13 (×3): 4 mg via ORAL
  Filled 2016-04-09 (×3): qty 1

## 2016-04-09 MED ORDER — HYDROCODONE-ACETAMINOPHEN 5-325 MG PO TABS
1.0000 | ORAL_TABLET | ORAL | Status: DC | PRN
  Administered 2016-04-09: 14:00:00 1 via ORAL
  Administered 2016-04-09 – 2016-04-10 (×3): 2 via ORAL
  Administered 2016-04-11 (×2): 1 via ORAL
  Administered 2016-04-11 – 2016-04-13 (×4): 2 via ORAL
  Filled 2016-04-09: qty 2
  Filled 2016-04-09: qty 1
  Filled 2016-04-09 (×3): qty 2
  Filled 2016-04-09 (×2): qty 1
  Filled 2016-04-09 (×3): qty 2
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
## 2016-04-09 NOTE — Progress Notes (Signed)
Pt with headache not resolved with Fioricet.  Pt also complains of nausea and some dry heaves.  Pt given Zofran at change of shift.  Pt now given norco which caused her to have dry heaves but able to keep medication down.  Called Dr Ara Kussmaul to discuss.  Order received for Toradol x 1 and Reglan x 1. Dorna Bloom RN

## 2016-04-10 LAB — BASIC METABOLIC PANEL
Anion gap: 7 (ref 5–15)
BUN: 13 mg/dL (ref 6–20)
CALCIUM: 8.6 mg/dL — AB (ref 8.9–10.3)
CO2: 23 mmol/L (ref 22–32)
Chloride: 109 mmol/L (ref 101–111)
Creatinine, Ser: 1.16 mg/dL — ABNORMAL HIGH (ref 0.44–1.00)
GFR calc Af Amer: >60 mL/min (ref >60)
GFR, EST NON AFRICAN AMERICAN: 52 mL/min — AB (ref >60)
GLUCOSE: 86 mg/dL (ref 65–99)
Potassium: 3.5 mmol/L (ref 3.5–5.1)
SODIUM: 139 mmol/L (ref 135–145)

## 2016-04-10 LAB — CBC
HCT: 35.4 % (ref 35.0–47.0)
Hemoglobin: 12.4 g/dL (ref 12.0–16.0)
MCH: 33.8 pg (ref 26.0–34.0)
MCHC: 35.1 g/dL (ref 32.0–36.0)
MCV: 96.4 fL (ref 80.0–100.0)
PLATELETS: 323 10*3/uL (ref 150–440)
RBC: 3.68 MIL/uL — ABNORMAL LOW (ref 3.80–5.20)
RDW: 12.9 % (ref 11.5–14.5)
WBC: 7.2 10*3/uL (ref 3.6–11.0)

## 2016-04-10 LAB — GLUCOSE, CAPILLARY: GLUCOSE-CAPILLARY: 90 mg/dL (ref 65–99)

## 2016-04-10 MED ORDER — MOMETASONE FURO-FORMOTEROL FUM 200-5 MCG/ACT IN AERO
2.0000 | INHALATION_SPRAY | Freq: Two times a day (BID) | RESPIRATORY_TRACT | Status: DC
  Administered 2016-04-10 – 2016-04-14 (×9): 2 via RESPIRATORY_TRACT
  Filled 2016-04-10: qty 8.8

## 2016-04-10 MED ORDER — PROMETHAZINE HCL 25 MG/ML IJ SOLN
12.5000 mg | Freq: Four times a day (QID) | INTRAMUSCULAR | Status: DC | PRN
  Administered 2016-04-10 – 2016-04-13 (×4): 12.5 mg via INTRAVENOUS
  Filled 2016-04-10 (×4): qty 1

## 2016-04-10 MED ORDER — UNJURY CHICKEN SOUP POWDER: 8.0000 [oz_av] | Freq: Two times a day (BID) | ORAL | Status: DC

## 2016-04-10 MED ORDER — LORAZEPAM 2 MG/ML IJ SOLN
1.0000 mg | Freq: Once | INTRAMUSCULAR | Status: AC
  Administered 2016-04-10: 1 mg via INTRAVENOUS
  Filled 2016-04-10: qty 1

## 2016-04-10 MED ORDER — ALBUTEROL SULFATE (2.5 MG/3ML) 0.083% IN NEBU
2.5000 mg | INHALATION_SOLUTION | RESPIRATORY_TRACT | Status: DC | PRN
  Administered 2016-04-11 – 2016-04-14 (×7): 2.5 mg via RESPIRATORY_TRACT
  Filled 2016-04-10 (×9): qty 3

## 2016-04-10 MED ORDER — BOOST / RESOURCE BREEZE PO LIQD
1.0000 | Freq: Three times a day (TID) | ORAL | Status: DC
  Administered 2016-04-10 – 2016-04-14 (×4): 1 via ORAL

## 2016-04-10 MED ORDER — ASPIRIN EC 81 MG PO TBEC
81.0000 mg | DELAYED_RELEASE_TABLET | Freq: Every day | ORAL | Status: DC
  Administered 2016-04-11 – 2016-04-14 (×4): 81 mg via ORAL
  Filled 2016-04-10 (×4): qty 1

## 2016-04-10 MED ORDER — ALBUTEROL SULFATE HFA 108 (90 BASE) MCG/ACT IN AERS: 1.0000 | INHALATION_SPRAY | RESPIRATORY_TRACT | Status: DC | PRN

## 2016-04-10 MED ORDER — METOPROLOL SUCCINATE ER 25 MG PO TB24
12.5000 mg | ORAL_TABLET | Freq: Every day | ORAL | Status: DC
  Administered 2016-04-11 – 2016-04-14 (×4): 12.5 mg via ORAL
  Filled 2016-04-10 (×4): qty 1

## 2016-04-10 NOTE — Plan of Care (Signed)
Problem: Pain Managment: Goal: General experience of comfort will improve Outcome: Progressing After some difficulty at the beginning of shift, pt was able to get comfortable, her headache resolved, nausea resolved, and she was able to tolerate frozen ice.  Pt fell asleep for a good portion of the night.   Problem: Activity: Goal: Risk for activity intolerance will decrease Outcome: Not Progressing Pt remains on bedrest secondary to fatigue and nausea.  Problem: Nutrition: Goal: Adequate nutrition will be maintained Outcome: Progressing Pt did not eat her full liquid dinner but did tolerate clear liquids throughout the night.

## 2016-04-10 NOTE — Progress Notes (Signed)
Initial Nutrition Assessment  DOCUMENTATION CODES:   Severe malnutrition in context of acute illness/injury  INTERVENTION:  Provide Boost Breeze po TID, each supplement provides 250 kcal and 9 grams of protein.  Provide Unjury Chicken Soup po BID, each supplement provides 100 kcal and 21 grams of protein.  Reviewed Nausea/Vomiting Nutrition Therapy with patient. Also provided patient copy of Heart Healthy Nutrition Therapy per her request as she reports they did not go over that with her prior to discharge after heart attack.  NUTRITION DIAGNOSIS:   Inadequate oral intake related to poor appetite, nausea, other (see comment) (headaches, abdominal pain, dizziness) as evidenced by per patient/family report, 3 percent weight loss over 1 week.  GOAL:   Patient will meet greater than or equal to 90% of their needs  MONITOR:   PO intake, Supplement acceptance, Diet advancement, Labs, Weight trends, I & O's  REASON FOR ASSESSMENT:   Malnutrition Screening Tool    ASSESSMENT:   55 y.o. female with a known history of CAD with recent non-STEMI and stent placed at [facility] who presented to Dr Etta Quill office for routine follow-up. She was complaining of weakness since discharge from the hospital. She also reports diarrhea and nausea for the past few days.   Spoke with patient at bedside. She reports poor appetite since her heart attack last week. She has been experiencing nausea, headaches, abdominal pain, dizziness, and diarrhea. Patient reports she has not been able to tolerate solid food since 9/19 and is also barely drinking anything. Prior to that she reports having minimal intake since 12/12 - just bites of food. Patient reports she cannot tolerate Ensure at this time but is amenable to trying clear oral nutrition supplements.  Reports UBW 168 lbs. She reports she has lost 5 lbs (3% body weight) over 1 week, which is significant for time frame.  Medications reviewed and include: NS @  75 ml/hr.  Labs reviewed: CBG 90, Creatinine 1.16.   Nutrition-Focused physical exam completed. Findings are no fat depletion, no muscle depletion, and no edema.   Patient meets criteria for severe acute malnutrition in setting of 3% weight  loss over 1 weeks, intake </= 50% for >/= 5 days.   Discussed with RN.   Diet Order:  Diet full liquid Room service appropriate? Yes; Fluid consistency: Thin  Skin:  Reviewed, no issues  Last BM:  04/07/2016  Height:   Ht Readings from Last 1 Encounters:  04/09/16 5\' 3"  (1.6 m)    Weight:   Wt Readings from Last 1 Encounters:  04/09/16 163 lb 4 oz (74 kg)    Ideal Body Weight:  52.3 kg  BMI:  Body mass index is 28.92 kg/m.  Estimated Nutritional Needs:   Kcal:  1700-1900 (MSJ x 1.3-1.4)  Protein:  88-96 grams (1.2-1.3 grams/kg)  Fluid:  >/= 1.8 L/day (25 ml/kg)  EDUCATION NEEDS:   Education needs addressed  Willey Blade, MS, RD, LDN Pager: [phone] After Hours Pager: [phone]

## 2016-04-10 NOTE — Progress Notes (Signed)
[name] NAME: [name]    MRN#:  [id number]  DATE OF BIRTH:  July 18, 1960  SUBJECTIVE:  Hospital Day: 1 day [name] is a 55 y.o. female presenting with weakness.   Overnight events: no overnight events Interval Events: still complains of nausea and general weakness  REVIEW OF SYSTEMS:  CONSTITUTIONAL: No fever, positive fatigue or weakness.  EYES: No blurred or double vision.  EARS, NOSE, AND THROAT: No tinnitus or ear pain.  RESPIRATORY: No cough, shortness of breath, wheezing or hemoptysis.  CARDIOVASCULAR: No chest pain, orthopnea, edema.  GASTROINTESTINAL: positive nausea, vomiting, denies diarrhea or abdominal pain.  GENITOURINARY: No dysuria, hematuria.  ENDOCRINE: No polyuria, nocturia,  HEMATOLOGY: No anemia, easy bruising or bleeding SKIN: No rash or lesion. MUSCULOSKELETAL: No joint pain or arthritis.   NEUROLOGIC: No tingling, numbness, weakness.  PSYCHIATRY: No anxiety or depression.   DRUG ALLERGIES:   Allergies  Allergen Reactions  . Iodine   . Lidocaine   . Neurontin [Gabapentin]     Dizziness, Confusion   . Pregabalin Other (See Comments)    'bad reaction' hallucinations and acting crazy after taking Lyrica  . Shellfish Allergy Nausea And Vomiting    VITALS:  Blood pressure 127/70, pulse 77, temperature 97.7 F (36.5 C), temperature source Oral, resp. rate 18, height 5\' 3"  (1.6 m), weight 74 kg (163 lb 4 oz), last menstrual period 02/03/2011, SpO2 96 %.  PHYSICAL EXAMINATION:  VITAL SIGNS: Vitals:   04/10/16 0403 04/10/16 1338  BP: 115/62 127/70  Pulse: 73 77  Resp: 20 18  Temp: 97.9 F (36.6 C) 97.7 F (36.5 C)   GENERAL:55 y.o.female currently in no acute distress.  HEAD: Normocephalic, atraumatic.  EYES: Pupils equal, round, reactive to light. Extraocular muscles intact. No scleral icterus.  MOUTH: Moist mucosal membrane. Dentition intact. No abscess noted.  EAR, NOSE,  THROAT: Clear without exudates. No external lesions.  NECK: Supple. No thyromegaly. No nodules. No JVD.  PULMONARY: Clear to ascultation, without wheeze rails or rhonci. No use of accessory muscles, Good respiratory effort. good air entry bilaterally CHEST: Nontender to palpation.  CARDIOVASCULAR: S1 and S2. Regular rate and rhythm. No murmurs, rubs, or gallops. No edema. Pedal pulses 2+ bilaterally.  GASTROINTESTINAL: Soft, nontender, nondistended. No masses. Positive bowel sounds. No hepatosplenomegaly.  MUSCULOSKELETAL: No swelling, clubbing, or edema. Range of motion full in all extremities.  NEUROLOGIC: Cranial nerves II through XII are intact. No gross focal neurological deficits. Sensation intact. Reflexes intact.  SKIN: No ulceration, lesions, rashes, or cyanosis. Skin warm and dry. Turgor intact.  PSYCHIATRIC: Mood, affect within normal limits. The patient is awake, alert and oriented x 3. Insight, judgment intact.      LABORATORY PANEL:   CBC  Recent Labs Lab 04/10/16 0447  WBC 7.2  HGB 12.4  HCT 35.4  PLT 323   ------------------------------------------------------------------------------------------------------------------  Chemistries   Recent Labs Lab 04/09/16 1211 04/10/16 0447  NA 138 139  K 3.8 3.5  CL 107 109  CO2 22 23  GLUCOSE 89 86  BUN 9 13  CREATININE 1.09* 1.16*  CALCIUM 9.2 8.6*  MG 1.7  --    ------------------------------------------------------------------------------------------------------------------  Cardiac Enzymes No results for input(s): TROPONINI in the last 168 hours. ------------------------------------------------------------------------------------------------------------------  RADIOLOGY:  Dg Abd 1 View  Result Date: 04/09/2016 CLINICAL DATA:  Abdominal pain with nausea. EXAM: ABDOMEN - 1 VIEW COMPARISON:  CT scan 06/30/2013. FINDINGS: Supine abdomen shows no gaseous bowel dilatation  suggest obstruction. Gallbladder is  surgically absent. Phleboliths overlie the inferior anatomic pelvis. Visualized bony anatomy is unremarkable. IMPRESSION: Normal bowel gas pattern. Electronically Signed   By: Misty Stanley M.D.   On: 04/09/2016 14:52    EKG:   Orders placed or performed during the hospital encounter of 07/17/11  . ED EKG  . ED EKG  . EKG    ASSESSMENT AND PLAN:   [name] is a 55 y.o. female presenting with weakness. Admitted 04/09/2016 : Day #: 1 day 1. Intractable nausea/vaomiting: supportive measures, advance diet as tolerated 2. Weakness: pt 3. CAD: asa. Statin, plavix, bb   All the records are reviewed and case discussed with Care Management/Social Workerr. Management plans discussed with the patient, family and they are in agreement.  CODE STATUS: full TOTAL TIME TAKING CARE OF THIS PATIENT: 28 minutes.   POSSIBLE D/C IN 1DAYS, DEPENDING ON CLINICAL CONDITION.   Hower,  Karenann Cai.D on 04/10/2016 at 3:31 PM  Between 7am to 6pm - Pager - [phone]  After 6pm: House Pager: - [facility]  Office  [phone]  CC: Primary care physician; [name], MD

## 2016-04-10 NOTE — Evaluation (Signed)
Physical Therapy Evaluation Patient Details Name: [name] MRN: [id number] DOB: Dec 25, 1960 Today's Date: 04/10/2016   History of Present Illness  Pt is a 55 y/o F who presented to Dr. Etta Quill office for a routine follow-up.  She was c/o weankess, diarrhea, and nausea for the past few days. Pt admitted with generalized weakness likely due to viral gastroenteritis.  Pt's PMH includes CAD with recent stent placed at [facility] 04/03/16, non-STEMI 04/01/16, anxiety, COPD, depression, tobacco use.    Clinical Impression  Pt admitted with above diagnosis. Pt currently with functional limitations due to the deficits listed below (see PT Problem List). Ms. [name] presents with generalized weakness due to nausea and diarrhea.  She fatigues quickly after ambulating 75ft with up to minA to steady and reports dizziness related to sickness.  Anticipate that pt will progress quickly with mobility once suspected virus passes and recommending HHPT at d/c.  She will have 24/7 assist available from her son at d/c. Pt will benefit from skilled PT to increase their independence and safety with mobility to allow discharge to the venue listed below.      Follow Up Recommendations Home health PT    Equipment Recommendations  Rolling walker with 5" wheels    Recommendations for Other Services       Precautions / Restrictions Precautions Precautions: Fall Restrictions Weight Bearing Restrictions: No      Mobility  Bed Mobility Overal bed mobility: Needs Assistance Bed Mobility: Sidelying to Sit;Rolling Rolling: Supervision Sidelying to sit: Supervision       General bed mobility comments: Cues for log roll technique as pt c/o abdominal pain.  Increased effort and time and pt uses bed rail.  Transfers Overall transfer level: Needs assistance Equipment used: 1 person hand held assist Transfers: Sit to/from Stand Sit to Stand: Min assist         General transfer comment: 1 person HHA to remain  steady for sit>stand.  Pt did not require assist to sit but min guard assist provided for safety.   Ambulation/Gait Ambulation/Gait assistance: Min assist Ambulation Distance (Feet): 8 Feet Assistive device: None Gait Pattern/deviations: Decreased stride length;Drifts right/left Gait velocity: decreased Gait velocity interpretation: Below normal speed for age/gender General Gait Details: Pt unteady and drifts L/R due to generalized weakness and dizziness.  Pt reaching arms out to her sides to compensate for her poor balance.    Stairs            Wheelchair Mobility    Modified Rankin (Stroke Patients Only)       Balance Overall balance assessment: Needs assistance Sitting-balance support: No upper extremity supported;Feet supported Sitting balance-Leahy Scale: Fair Sitting balance - Comments: Pt groggy and dizzy   Standing balance support: No upper extremity supported;During functional activity Standing balance-Leahy Scale: Fair                               Pertinent Vitals/Pain Pain Assessment: 0-10 Pain Score: 3  Pain Location: headache, abdomen Pain Descriptors / Indicators: Constant;Aching Pain Intervention(s): Limited activity within patient's tolerance;Monitored during session;Repositioned    Home Living Family/patient expects to be discharged to:: Private residence Living Arrangements: Alone Available Help at Discharge: Family;Available 24 hours/day (son until Wed) Type of Home: House Home Access: Stairs to enter Entrance Stairs-Rails: Chemical engineer of Steps: 2 Home Layout: One level Home Equipment: Electronics engineer Comments: Son spends the night 1x/wk    Prior Function Level  of Independence: Independent         Comments: Prior to MI pt was independent working full-time.  Denies any falls in the past 6 months.      Hand Dominance   Dominant Hand: Right    Extremity/Trunk Assessment   Upper Extremity  Assessment Upper Extremity Assessment: Generalized weakness    Lower Extremity Assessment Lower Extremity Assessment: Generalized weakness    Cervical / Trunk Assessment Cervical / Trunk Assessment: Normal  Communication   Communication: No difficulties  Cognition Arousal/Alertness: Awake/alert Behavior During Therapy: Anxious Overall Cognitive Status: Within Functional Limits for tasks assessed                      General Comments      Exercises General Exercises - Upper Extremity Shoulder Flexion: AROM;Both;10 reps;Seated General Exercises - Lower Extremity Ankle Circles/Pumps: AROM;Both;10 reps;Supine Quad Sets: Strengthening;Both;10 reps;Supine Long Arc Quad: Strengthening;Both;10 reps;Seated   Assessment/Plan    PT Assessment Patient needs continued PT services  PT Problem List Decreased activity tolerance;Decreased strength;Decreased balance;Decreased knowledge of use of DME;Decreased safety awareness;Pain          PT Treatment Interventions DME instruction;Gait training;Stair training;Therapeutic activities;Functional mobility training;Therapeutic exercise;Balance training;Patient/family education    PT Goals (Current goals can be found in the Care Plan section)  Acute Rehab PT Goals Patient Stated Goal: to feel better and go home PT Goal Formulation: With patient Time For Goal Achievement: 04/24/16 Potential to Achieve Goals: Good    Frequency Min 2X/week   Barriers to discharge        Co-evaluation               End of Session Equipment Utilized During Treatment: Gait belt Activity Tolerance: Patient limited by fatigue Patient left: in chair;with call bell/phone within reach;with chair alarm set Nurse Communication: Mobility status    Functional Assessment Tool Used: Clinical Judgement Functional Limitation: Mobility: Walking and moving around Mobility: Walking and Moving Around Current Status [phone]): At least 40 percent but less  than 60 percent impaired, limited or restricted Mobility: Walking and Moving Around Goal Status [phone]): At least 1 percent but less than 20 percent impaired, limited or restricted    Time: [id number] PT Time Calculation (min) (ACUTE ONLY): 30 min   Charges:   PT Evaluation $PT Eval Low Complexity: 1 Procedure PT Treatments $Therapeutic Activity: 8-22 mins   PT G Codes:   PT G-Codes **NOT FOR INPATIENT CLASS** Functional Assessment Tool Used: Clinical Judgement Functional Limitation: Mobility: Walking and moving around Mobility: Walking and Moving Around Current Status [id number]): At least 40 percent but less than 60 percent impaired, limited or restricted Mobility: Walking and Moving Around Goal Status [phone]): At least 1 percent but less than 20 percent impaired, limited or restricted    Collie Siad PT, DPT 04/10/2016, 12:16 PM

## 2016-04-11 DIAGNOSIS — E43 Unspecified severe protein-calorie malnutrition: Secondary | ICD-10-CM | POA: Insufficient documentation

## 2016-04-11 LAB — HEPATIC FUNCTION PANEL
ALBUMIN: 3.5 g/dL (ref 3.5–5.0)
ALT: 72 U/L — ABNORMAL HIGH (ref 14–54)
AST: 40 U/L (ref 15–41)
Alkaline Phosphatase: 117 U/L (ref 38–126)
Bilirubin, Direct: <0.1 mg/dL — ABNORMAL LOW (ref 0.1–0.5)
Total Bilirubin: 0.8 mg/dL (ref 0.3–1.2)
Total Protein: 6.2 g/dL — ABNORMAL LOW (ref 6.5–8.1)

## 2016-04-11 MED ORDER — SODIUM CHLORIDE 0.9 % IV SOLN
INTRAVENOUS | Status: DC
  Administered 2016-04-11 – 2016-04-13 (×4): via INTRAVENOUS

## 2016-04-11 MED ORDER — DIPHENHYDRAMINE HCL 25 MG PO CAPS
25.0000 mg | ORAL_CAPSULE | Freq: Four times a day (QID) | ORAL | Status: DC | PRN
  Administered 2016-04-11: 25 mg via ORAL
  Filled 2016-04-11: qty 1

## 2016-04-11 MED ORDER — ONDANSETRON HCL 4 MG/2ML IJ SOLN
4.0000 mg | Freq: Once | INTRAMUSCULAR | Status: AC
  Administered 2016-04-11: 4 mg via INTRAVENOUS
  Filled 2016-04-11: qty 2

## 2016-04-11 MED ORDER — LORAZEPAM 2 MG/ML IJ SOLN
2.0000 mg | Freq: Once | INTRAMUSCULAR | Status: AC
  Administered 2016-04-11: 14:00:00 2 mg via INTRAVENOUS
  Filled 2016-04-11: qty 1

## 2016-04-11 NOTE — Progress Notes (Signed)
[facility] [place] NAME: [name]    MRN#:  [id number]  DATE OF BIRTH:  [birth year]  SUBJECTIVE:  Hospital Day: 1 day [name] is a 55 y.o. female presenting with weakness.   Overnight events: no overnight events Interval Events: Actively vomiting, still with nausea and general weakness  REVIEW OF SYSTEMS:  CONSTITUTIONAL: No fever, positive fatigue or weakness.  EYES: No blurred or double vision.  EARS, NOSE, AND THROAT: No tinnitus or ear pain.  RESPIRATORY: No cough, shortness of breath, wheezing or hemoptysis.  CARDIOVASCULAR: No chest pain, orthopnea, edema.  GASTROINTESTINAL: positive nausea, vomiting, denies diarrhea or abdominal pain.  GENITOURINARY: No dysuria, hematuria.  ENDOCRINE: No polyuria, nocturia,  HEMATOLOGY: No anemia, easy bruising or bleeding SKIN: No rash or lesion. MUSCULOSKELETAL: No joint pain or arthritis.   NEUROLOGIC: No tingling, numbness, weakness.  PSYCHIATRY: No anxiety or depression.   DRUG ALLERGIES:   Allergies  Allergen Reactions  . Iodine   . Lidocaine   . Neurontin [Gabapentin]     Dizziness, Confusion   . Pregabalin Other (See Comments)    'bad reaction' hallucinations and acting crazy after taking Lyrica  . Shellfish Allergy Nausea And Vomiting    VITALS:  Blood pressure 133/65, pulse 80, temperature 98.4 F (36.9 C), resp. rate 20, height 5\' 3"  (1.6 m), weight 74 kg (163 lb 4 oz), last menstrual period 02/03/2011, SpO2 97 %.  PHYSICAL EXAMINATION:  VITAL SIGNS: Vitals:   04/11/16 1018 04/11/16 1342  BP: 129/68 133/65  Pulse: 84 80  Resp:  20  Temp:  98.4 F (36.9 C)   GENERAL:55 y.o.female currently in no acute distress.  HEAD: Normocephalic, atraumatic.  EYES: Pupils equal, round, reactive to light. Extraocular muscles intact. No scleral icterus.  MOUTH: Moist mucosal membrane. Dentition intact. No abscess noted.  EAR, NOSE, THROAT: Clear without exudates.  No external lesions.  NECK: Supple. No thyromegaly. No nodules. No JVD.  PULMONARY: Clear to ascultation, without wheeze rails or rhonci. No use of accessory muscles, Good respiratory effort. good air entry bilaterally CHEST: Nontender to palpation.  CARDIOVASCULAR: S1 and S2. Regular rate and rhythm. No murmurs, rubs, or gallops. No edema. Pedal pulses 2+ bilaterally.  GASTROINTESTINAL: Soft, nontender, nondistended. No masses. Positive bowel sounds. No hepatosplenomegaly.  MUSCULOSKELETAL: No swelling, clubbing, or edema. Range of motion full in all extremities.  NEUROLOGIC: Cranial nerves II through XII are intact. No gross focal neurological deficits. Sensation intact. Reflexes intact.  SKIN: No ulceration, lesions, rashes, or cyanosis. Skin warm and dry. Turgor intact.  PSYCHIATRIC: Mood, affect within normal limits. The patient is awake, alert and oriented x 3. Insight, judgment intact.      LABORATORY PANEL:   CBC  Recent Labs Lab 04/10/16 0447  WBC 7.2  HGB 12.4  HCT 35.4  PLT 323   ------------------------------------------------------------------------------------------------------------------  Chemistries   Recent Labs Lab 04/09/16 1211 04/10/16 0447  NA 138 139  K 3.8 3.5  CL 107 109  CO2 22 23  GLUCOSE 89 86  BUN 9 13  CREATININE 1.09* 1.16*  CALCIUM 9.2 8.6*  MG 1.7  --    ------------------------------------------------------------------------------------------------------------------  Cardiac Enzymes No results for input(s): TROPONINI in the last 168 hours. ------------------------------------------------------------------------------------------------------------------  RADIOLOGY:  No results found.  EKG:   Orders placed or performed during the hospital encounter of 07/17/11  . ED EKG  . ED EKG  . EKG    ASSESSMENT AND PLAN:   [name]  is a 55 y.o. female presenting with weakness. Admitted 04/09/2016 : Day #: 1 day 1. Intractable  nausea/vaomiting: supportive measures, advance diet as tolerated, Check LFTs 2. Weakness: pt 3. CAD: asa. Statin, plavix, bb   All the records are reviewed and case discussed with Care Management/Social Workerr. Management plans discussed with the patient, family and they are in agreement.  CODE STATUS: full TOTAL TIME TAKING CARE OF THIS PATIENT: 28 minutes.   POSSIBLE D/C IN 1DAYS, DEPENDING ON CLINICAL CONDITION.   Hower,  Karenann Cai.D on 04/11/2016 at 1:47 PM  Between 7am to 6pm - Pager - [phone]  After 6pm: House Pager: - [facility] [facility]  Office  [phone]  CC: Primary care physician; [name], MD

## 2016-04-11 NOTE — Progress Notes (Signed)
PT Cancellation Note  Patient Details Name: DELONE MCLARTY MRN: [id number] DOB: 09/04/1960   Cancelled Treatment:    Reason Eval/Treat Not Completed: Patient declined, no reason specified (pt feeling nauseous this am).  Pt politely declines PT at this time.  Will attempt to see pt again later today, schedule permitting.   Collie Siad PT, DPT 04/11/2016, 10:53 AM

## 2016-04-11 NOTE — Plan of Care (Signed)
Problem: Pain Managment: Goal: General experience of comfort will improve Outcome: Not Progressing Pt is still experiencing nausea and vomiting.   Problem: Activity: Goal: Risk for activity intolerance will decrease Outcome: Progressing Pt ambulating to bathroom as tolerated  Problem: Nutrition: Goal: Adequate nutrition will be maintained Outcome: Not Progressing Pt not able to tolerate liquids, vomited after water was given with meds.

## 2016-04-11 NOTE — Care Management (Signed)
Admitted to this facility with the diagnosis of gastroenteritis under observation status. Lives alone.  Son is Ovid Curd [phone]). Last seen Dr. Hilma Favors in September/October 2017 at clinic in [place]. No Home Health. No skilled facility. No home oxygen. Takes care of all basic activities of daily living herself, drives. Prescriptions are filled at [facility]. No falls. Lost 5 lbs in the last 2 weeks. Fair appetite. Uses no aids for ambulation. Son will transport Physical therapy evaluation completed. Recommends home with home health and physical therapy. Discussed home health agencies at the bedside. [place]. Will update Floydene Flock, [facility] representative. Ms. Klamm states that her insurance will elapse December 31st. States she can't pay $1.000.00/month for [employer].  Requested information on [facility]. Internet query completed. Hard copy of this information given to Ms. [place] RN MSN CCM Care Management

## 2016-04-12 ENCOUNTER — Inpatient Hospital Stay: Payer: BLUE CROSS/BLUE SHIELD

## 2016-04-12 DIAGNOSIS — F419 Anxiety disorder, unspecified: Secondary | ICD-10-CM | POA: Diagnosis present

## 2016-04-12 DIAGNOSIS — R110 Nausea: Secondary | ICD-10-CM | POA: Diagnosis not present

## 2016-04-12 DIAGNOSIS — Z7951 Long term (current) use of inhaled steroids: Secondary | ICD-10-CM | POA: Diagnosis not present

## 2016-04-12 DIAGNOSIS — Z9049 Acquired absence of other specified parts of digestive tract: Secondary | ICD-10-CM | POA: Diagnosis not present

## 2016-04-12 DIAGNOSIS — N179 Acute kidney failure, unspecified: Secondary | ICD-10-CM | POA: Diagnosis present

## 2016-04-12 DIAGNOSIS — Z9109 Other allergy status, other than to drugs and biological substances: Secondary | ICD-10-CM | POA: Diagnosis not present

## 2016-04-12 DIAGNOSIS — I252 Old myocardial infarction: Secondary | ICD-10-CM | POA: Diagnosis not present

## 2016-04-12 DIAGNOSIS — Z955 Presence of coronary angioplasty implant and graft: Secondary | ICD-10-CM | POA: Diagnosis not present

## 2016-04-12 DIAGNOSIS — A084 Viral intestinal infection, unspecified: Secondary | ICD-10-CM | POA: Diagnosis present

## 2016-04-12 DIAGNOSIS — F332 Major depressive disorder, recurrent severe without psychotic features: Secondary | ICD-10-CM | POA: Diagnosis not present

## 2016-04-12 DIAGNOSIS — K219 Gastro-esophageal reflux disease without esophagitis: Secondary | ICD-10-CM | POA: Diagnosis present

## 2016-04-12 DIAGNOSIS — R112 Nausea with vomiting, unspecified: Secondary | ICD-10-CM | POA: Diagnosis present

## 2016-04-12 DIAGNOSIS — E860 Dehydration: Secondary | ICD-10-CM | POA: Diagnosis present

## 2016-04-12 DIAGNOSIS — K3184 Gastroparesis: Secondary | ICD-10-CM | POA: Diagnosis present

## 2016-04-12 DIAGNOSIS — J449 Chronic obstructive pulmonary disease, unspecified: Secondary | ICD-10-CM | POA: Diagnosis present

## 2016-04-12 DIAGNOSIS — G43A1 Cyclical vomiting, intractable: Secondary | ICD-10-CM | POA: Diagnosis not present

## 2016-04-12 DIAGNOSIS — I5020 Unspecified systolic (congestive) heart failure: Secondary | ICD-10-CM | POA: Diagnosis present

## 2016-04-12 DIAGNOSIS — Z91013 Allergy to seafood: Secondary | ICD-10-CM | POA: Diagnosis not present

## 2016-04-12 DIAGNOSIS — I2510 Atherosclerotic heart disease of native coronary artery without angina pectoris: Secondary | ICD-10-CM | POA: Diagnosis present

## 2016-04-12 DIAGNOSIS — I110 Hypertensive heart disease with heart failure: Secondary | ICD-10-CM | POA: Diagnosis present

## 2016-04-12 DIAGNOSIS — F329 Major depressive disorder, single episode, unspecified: Secondary | ICD-10-CM | POA: Diagnosis present

## 2016-04-12 DIAGNOSIS — R531 Weakness: Secondary | ICD-10-CM | POA: Diagnosis present

## 2016-04-12 DIAGNOSIS — Z888 Allergy status to other drugs, medicaments and biological substances status: Secondary | ICD-10-CM | POA: Diagnosis not present

## 2016-04-12 DIAGNOSIS — Z8249 Family history of ischemic heart disease and other diseases of the circulatory system: Secondary | ICD-10-CM | POA: Diagnosis not present

## 2016-04-12 DIAGNOSIS — Z79899 Other long term (current) drug therapy: Secondary | ICD-10-CM | POA: Diagnosis not present

## 2016-04-12 DIAGNOSIS — F17210 Nicotine dependence, cigarettes, uncomplicated: Secondary | ICD-10-CM | POA: Diagnosis present

## 2016-04-12 DIAGNOSIS — E43 Unspecified severe protein-calorie malnutrition: Secondary | ICD-10-CM | POA: Diagnosis present

## 2016-04-12 DIAGNOSIS — Z825 Family history of asthma and other chronic lower respiratory diseases: Secondary | ICD-10-CM | POA: Diagnosis not present

## 2016-04-12 DIAGNOSIS — Z791 Long term (current) use of non-steroidal anti-inflammatories (NSAID): Secondary | ICD-10-CM | POA: Diagnosis not present

## 2016-04-12 LAB — MRSA PCR SCREENING: MRSA by PCR: NEGATIVE

## 2016-04-12 MED ORDER — LISINOPRIL 5 MG PO TABS
5.0000 mg | ORAL_TABLET | Freq: Every day | ORAL | Status: DC
  Administered 2016-04-12 – 2016-04-14 (×3): 5 mg via ORAL
  Filled 2016-04-12 (×3): qty 1

## 2016-04-12 MED ORDER — LORAZEPAM 2 MG/ML IJ SOLN
2.0000 mg | Freq: Four times a day (QID) | INTRAMUSCULAR | Status: DC | PRN
  Administered 2016-04-12 – 2016-04-13 (×4): 2 mg via INTRAVENOUS
  Filled 2016-04-12 (×4): qty 1

## 2016-04-12 MED ORDER — IOPAMIDOL (ISOVUE-300) INJECTION 61%: 15.0000 mL | INTRAVENOUS | Status: AC

## 2016-04-12 MED ORDER — SUCRALFATE 1 GM/10ML PO SUSP
1.0000 g | Freq: Three times a day (TID) | ORAL | Status: DC
  Administered 2016-04-12 – 2016-04-14 (×8): 1 g via ORAL
  Filled 2016-04-12 (×8): qty 10

## 2016-04-12 MED ORDER — IOPAMIDOL (ISOVUE-300) INJECTION 61%
100.0000 mL | Freq: Once | INTRAVENOUS | Status: AC | PRN
Start: 2016-04-12 — End: 2016-04-12
  Administered 2016-04-12: 100 mL via INTRAVENOUS

## 2016-04-12 NOTE — Progress Notes (Signed)
[facility] [place] NAME: [name]    MR#:  [id number]  DATE OF BIRTH:  1960-09-15  SUBJECTIVE:   Still with nausea and vomiting after trying to eat last night Complaining of epigastric pain  REVIEW OF SYSTEMS:    Review of Systems  Constitutional: Positive for malaise/fatigue. Negative for chills and fever.  HENT: Negative.  Negative for ear discharge, ear pain, hearing loss, nosebleeds and sore throat.   Eyes: Negative.  Negative for blurred vision and pain.  Respiratory: Negative.  Negative for cough, hemoptysis, shortness of breath and wheezing.   Cardiovascular: Negative.  Negative for chest pain, palpitations and leg swelling.  Gastrointestinal: Positive for abdominal pain, nausea and vomiting. Negative for blood in stool and diarrhea.  Genitourinary: Negative.  Negative for dysuria.  Musculoskeletal: Negative.  Negative for back pain.  Skin: Negative.   Neurological: Negative for dizziness, tremors, speech change, focal weakness, seizures and headaches.  Endo/Heme/Allergies: Negative.  Does not bruise/bleed easily.  Psychiatric/Behavioral: Negative.  Negative for depression, hallucinations and suicidal ideas.    Tolerating Diet: np      DRUG ALLERGIES:   Allergies  Allergen Reactions  . Iodine   . Lidocaine   . Neurontin [Gabapentin]     Dizziness, Confusion   . Pregabalin Other (See Comments)    'bad reaction' hallucinations and acting crazy after taking Lyrica  . Shellfish Allergy Nausea And Vomiting    VITALS:  Blood pressure (!) 120/58, pulse 78, temperature 97.8 F (36.6 C), temperature source Oral, resp. rate 18, height 5\' 3"  (1.6 m), weight 74 kg (163 lb 4 oz), last menstrual period 02/03/2011, SpO2 95 %.  PHYSICAL EXAMINATION:   Physical Exam  Constitutional: She is oriented to person, place, and time and well-developed, well-nourished, and in no distress. No distress.  HENT:  Head: Normocephalic.   Eyes: No scleral icterus.  Neck: Normal range of motion. Neck supple. No JVD present. No tracheal deviation present.  Cardiovascular: Normal rate, regular rhythm and normal heart sounds.  Exam reveals no gallop and no friction rub.   No murmur heard. Pulmonary/Chest: Effort normal and breath sounds normal. No respiratory distress. She has no wheezes. She has no rales. She exhibits no tenderness.  Abdominal: Soft. Bowel sounds are normal. She exhibits no distension and no mass. There is tenderness. There is no rebound and no guarding.  Musculoskeletal: Normal range of motion. She exhibits no edema.  Neurological: She is alert and oriented to person, place, and time.  Skin: Skin is warm. No rash noted. No erythema.  Psychiatric: Affect and judgment normal.      LABORATORY PANEL:   CBC  Recent Labs Lab 04/10/16 0447  WBC 7.2  HGB 12.4  HCT 35.4  PLT 323   ------------------------------------------------------------------------------------------------------------------  Chemistries   Recent Labs Lab 04/09/16 1211 04/10/16 0447 04/11/16 1424  NA 138 139  --   K 3.8 3.5  --   CL 107 109  --   CO2 22 23  --   GLUCOSE 89 86  --   BUN 9 13  --   CREATININE 1.09* 1.16*  --   CALCIUM 9.2 8.6*  --   MG 1.7  --   --   AST  --   --  40  ALT  --   --  72*  ALKPHOS  --   --  117  BILITOT  --   --  0.8   ------------------------------------------------------------------------------------------------------------------  Cardiac Enzymes  No results for input(s): TROPONINI in the last 168 hours. ------------------------------------------------------------------------------------------------------------------  RADIOLOGY:  No results found.   ASSESSMENT AND PLAN:   55 year old female with history of CAD and recent stent placed at [facility] who presented with weakness and nausea with vomiting.  1. Intractable nausea/vomiting: KUB on admission was negative. Continues to have  epigastric pain LFTs normal CT scan of the abdomen Start Carafate in addition to PPI suspicion for gastric ulcer however due to recent cardiac stent placement not a candidate for EGD  2. Generalized weakness: Physical therapy pending  3. CAD with recent stent/NSTEMI; continue aspirin, Plavix, beta blocker and statin   4. Ischemic cardiomyopathy EF of 35%: Continue beta blocker and add ACE inhibitor.   5. Anxiety: Continue when necessary medications   6. COPD without exacerbation: Continue inhalers  Management plans discussed with the patient and she is in agreement.  CODE STATUS: full  TOTAL TIME TAKING CARE OF THIS PATIENT: 24 minutes.     POSSIBLE D/C tomorrow, DEPENDING ON CLINICAL CONDITION.   [name] M.D on 04/12/2016 at 7:59 AM  Between 7am to 6pm - Pager - [phone] After 6pm go to [email or web address] - password [email or web address] [facility]  Office  [phone]  CC: Primary care physician; Jasmine Kilts, MD  Note: This dictation was prepared with Dragon dictation along with smaller phrase technology. Any transcriptional errors that result from this process are unintentional.

## 2016-04-12 NOTE — Plan of Care (Signed)
Problem: Pain Managment: Goal: General experience of comfort will improve Outcome: Not Progressing Pt has uncontrolled nausea and is experiencing pain in her back and abdomen  Problem: Activity: Goal: Risk for activity intolerance will decrease Outcome: Progressing Pt ambulating well to the bathroom as needed  Problem: Nutrition: Goal: Adequate nutrition will be maintained Outcome: Not Progressing Pt unable to eat or drink anything without vomiting.

## 2016-04-13 DIAGNOSIS — F332 Major depressive disorder, recurrent severe without psychotic features: Secondary | ICD-10-CM

## 2016-04-13 LAB — BASIC METABOLIC PANEL
Anion gap: 8 (ref 5–15)
BUN: 5 mg/dL — AB (ref 6–20)
CALCIUM: 8.6 mg/dL — AB (ref 8.9–10.3)
CO2: 23 mmol/L (ref 22–32)
CREATININE: 1 mg/dL (ref 0.44–1.00)
Chloride: 111 mmol/L (ref 101–111)
GFR calc Af Amer: >60 mL/min (ref >60)
GLUCOSE: 82 mg/dL (ref 65–99)
Potassium: 3.8 mmol/L (ref 3.5–5.1)
SODIUM: 142 mmol/L (ref 135–145)

## 2016-04-13 MED ORDER — POLYETHYLENE GLYCOL 3350 17 G PO PACK
17.0000 g | PACK | Freq: Every day | ORAL | Status: DC
  Administered 2016-04-13 – 2016-04-14 (×2): 17 g via ORAL
  Filled 2016-04-13 (×2): qty 1

## 2016-04-13 MED ORDER — PANTOPRAZOLE SODIUM 40 MG PO TBEC
40.0000 mg | DELAYED_RELEASE_TABLET | Freq: Two times a day (BID) | ORAL | Status: DC
  Administered 2016-04-13 – 2016-04-14 (×3): 40 mg via ORAL
  Filled 2016-04-13 (×3): qty 1

## 2016-04-13 MED ORDER — METOCLOPRAMIDE HCL 5 MG PO TABS
5.0000 mg | ORAL_TABLET | Freq: Three times a day (TID) | ORAL | Status: DC
  Administered 2016-04-13 – 2016-04-14 (×5): 5 mg via ORAL
  Filled 2016-04-13 (×7): qty 1

## 2016-04-13 NOTE — Plan of Care (Signed)
Problem: Pain Managment: Goal: General experience of comfort will improve Outcome: Not Progressing Pt still requiring IV antiemetics, IV ativan, and pain medicine.  Problem: Nutrition: Goal: Adequate nutrition will be maintained Outcome: Not Progressing Pt unable to eat without vomiting.

## 2016-04-13 NOTE — Plan of Care (Signed)
Problem: Education: Goal: Knowledge of [facility] Education information/materials will improve Outcome: Progressing VSS, free of falls during shift.  Reported HA pain 6/10, nausea.  Received PRN PO Tylenol 650mg , IV Zofran 4mg .  No other complaints overnight.  Bed in low position, call bell within reach.  WCTM.
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
## 2016-04-13 NOTE — Progress Notes (Signed)
[facility] [place] NAME: Dimitri Hinderman    MR#:  [id number]  DATE OF BIRTH:  Jul 18, 1960  SUBJECTIVE:   Patient still with persistent nausea and vomiting. She has a history of candidal esophagitis and GERD. Denies chest pain Feeling weak today REVIEW OF SYSTEMS:    Review of Systems  Constitutional: Positive for malaise/fatigue. Negative for chills and fever.  HENT: Negative.  Negative for ear discharge, ear pain, hearing loss, nosebleeds and sore throat.   Eyes: Negative.  Negative for blurred vision and pain.  Respiratory: Negative.  Negative for cough, hemoptysis, shortness of breath and wheezing.   Cardiovascular: Negative.  Negative for chest pain, palpitations and leg swelling.  Gastrointestinal: Positive for abdominal pain, nausea and vomiting. Negative for blood in stool and diarrhea.  Genitourinary: Negative.  Negative for dysuria.  Musculoskeletal: Negative.  Negative for back pain.  Skin: Negative.   Neurological: Negative for dizziness, tremors, speech change, focal weakness, seizures and headaches.  Endo/Heme/Allergies: Negative.  Does not bruise/bleed easily.  Psychiatric/Behavioral: Negative.  Negative for depression, hallucinations and suicidal ideas.    Tolerating Diet: Not tolerating diet      DRUG ALLERGIES:   Allergies  Allergen Reactions  . Lidocaine   . Neurontin [Gabapentin]     Dizziness, Confusion   . Pregabalin Other (See Comments)    'bad reaction' hallucinations and acting crazy after taking Lyrica  . Shellfish Allergy Nausea And Vomiting    VITALS:  Blood pressure 127/63, pulse 77, temperature 98.2 F (36.8 C), temperature source Oral, resp. rate 20, height 5\' 3"  (1.6 m), weight 74 kg (163 lb 4 oz), last menstrual period 02/03/2011, SpO2 93 %.  PHYSICAL EXAMINATION:   Physical Exam  Constitutional: She is oriented to person, place, and time and well-developed, well-nourished, and in no distress.  No distress.  HENT:  Head: Normocephalic.  Eyes: No scleral icterus.  Neck: Normal range of motion. Neck supple. No JVD present. No tracheal deviation present.  Cardiovascular: Normal rate, regular rhythm and normal heart sounds.  Exam reveals no gallop and no friction rub.   No murmur heard. Pulmonary/Chest: Effort normal and breath sounds normal. No respiratory distress. She has no wheezes. She has no rales. She exhibits no tenderness.  Abdominal: Soft. Bowel sounds are normal. She exhibits no distension and no mass. There is tenderness. There is no rebound and no guarding.  Musculoskeletal: Normal range of motion. She exhibits no edema.  Neurological: She is alert and oriented to person, place, and time.  Skin: Skin is warm. No rash noted. No erythema.  Psychiatric: Affect and judgment normal.      LABORATORY PANEL:   CBC  Recent Labs Lab 04/10/16 0447  WBC 7.2  HGB 12.4  HCT 35.4  PLT 323   ------------------------------------------------------------------------------------------------------------------  Chemistries   Recent Labs Lab 04/09/16 1211  04/11/16 1424 04/13/16 0503  NA 138  < >  --  142  K 3.8  < >  --  3.8  CL 107  < >  --  111  CO2 22  < >  --  23  GLUCOSE 89  < >  --  82  BUN 9  < >  --  5*  CREATININE 1.09*  < >  --  1.00  CALCIUM 9.2  < >  --  8.6*  MG 1.7  --   --   --   AST  --   --  40  --  ALT  --   --  72*  --   ALKPHOS  --   --  117  --   BILITOT  --   --  0.8  --   < > = values in this interval not displayed. ------------------------------------------------------------------------------------------------------------------  Cardiac Enzymes No results for input(s): TROPONINI in the last 168 hours. ------------------------------------------------------------------------------------------------------------------  RADIOLOGY:  Ct Abdomen Pelvis W Contrast  Result Date: 04/12/2016 CLINICAL DATA:  Epigastric pain, nausea, and vomiting  after eating, post cardiac catheterization and coronary stenting 1 week ago, hypertension, COPD, history MI, smoker EXAM: CT ABDOMEN AND PELVIS WITH CONTRAST TECHNIQUE: Multidetector CT imaging of the abdomen and pelvis was performed using the standard protocol following bolus administration of intravenous contrast. Sagittal and coronal MPR images reconstructed from axial data set. CONTRAST:  174mL ISOVUE-300 IOPAMIDOL (ISOVUE-300) INJECTION 61% IV. Dilute oral contrast. COMPARISON:  06/30/2013 FINDINGS: Lower chest: Dependent bibasilar atelectasis. Gallbladder surgically absent. Liver unremarkable. Hepatobiliary: Normal appearance Pancreas: Normal appearance Spleen: Normal appearance.  Splenule inferior to spleen. Adrenals/Urinary Tract: Adrenal glands normal. Horseshoe kidney. No definite renal mass or hydronephrosis. Duplication of RIGHT ureter. No ureteral calculi. Bladder unremarkable. Stomach/Bowel: Normal appendix. Stomach and bowel loops normal appearance. Vascular/Lymphatic: Atherosclerotic calcification aorta without aneurysm. No adenopathy. Reproductive: Unremarkable uterus and adnexa Other: No free air or free fluid.  No hernia. Musculoskeletal: Bones demineralized. 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 No acute intra-abdominal or intrapelvic abnormalities. Aortic atherosclerosis. Electronically Signed   By: Lavonia Dana M.D.   On: 04/12/2016 14:18     ASSESSMENT AND PLAN:   55 year old female with history of CAD and recent stent placed at [facility] who presented with weakness and nausea with vomiting.  1. Intractable nausea/vomiting With history of esophagitis/GERD: KUB on admission was negative. CT scan shows no acute abdominal pathology Continues to have epigastric pain/intractable nausea and vomiting LFTs normal Continue Carafate and PPI  Start Reglan  GI consult for any other recommendations. .. Noted patient with recent non-STEMI   2. Generalized weakness:  Physical therapy pending  3. CAD with recent stent/NSTEMI; continue aspirin, Plavix, beta blocker and statin   4. Ischemic cardiomyopathy EF of 35%: Continue beta blocker and add ACE inhibitor.   5. Anxiety: Continue when necessary medications  Psych consult pending  6. COPD without exacerbation: Continue inhalers  Management plans discussed with the patient and she is in agreement.  CODE STATUS: full  TOTAL TIME TAKING CARE OF THIS PATIENT: 26 minutes.     POSSIBLE D/C tomorrow, DEPENDING ON CLINICAL CONDITION.   Angas Isabell M.D on 04/13/2016 at 7:24 AM  Between 7am to 6pm - Pager - [phone] After 6pm go to [email or web address] - password [email or web address] [facility]  Office  [phone]  CC: Primary care physician; [name], MD  Note: This dictation was prepared with Dragon dictation along with smaller phrase technology. Any transcriptional errors that result from this process are unintentional.

## 2016-04-14 DIAGNOSIS — G43A1 Cyclical vomiting, intractable: Secondary | ICD-10-CM

## 2016-04-14 DIAGNOSIS — R1115 Cyclical vomiting syndrome unrelated to migraine: Secondary | ICD-10-CM

## 2016-04-14 DIAGNOSIS — R110 Nausea: Secondary | ICD-10-CM

## 2016-04-14 MED ORDER — CLOPIDOGREL BISULFATE 75 MG PO TABS: 75.0000 mg | ORAL_TABLET | Freq: Every day | ORAL | 0 refills | Status: DC

## 2016-04-14 MED ORDER — METOCLOPRAMIDE HCL 10 MG PO TABS: 10.0000 mg | ORAL_TABLET | Freq: Three times a day (TID) | ORAL | 0 refills | Status: DC

## 2016-04-14 MED ORDER — METOPROLOL SUCCINATE ER 25 MG PO TB24: 12.5000 mg | ORAL_TABLET | Freq: Every day | ORAL | 0 refills | Status: DC

## 2016-04-14 MED ORDER — ATORVASTATIN CALCIUM 80 MG PO TABS: 80.0000 mg | ORAL_TABLET | Freq: Every day | ORAL | 0 refills | Status: DC

## 2016-04-14 MED ORDER — BOOST / RESOURCE BREEZE PO LIQD: 1.0000 | Freq: Three times a day (TID) | ORAL | 0 refills | Status: AC

## 2016-04-14 MED ORDER — SUCRALFATE 1 GM/10ML PO SUSP: 1.0000 g | Freq: Three times a day (TID) | ORAL | 0 refills | Status: DC

## 2016-04-14 MED ORDER — ASPIRIN 81 MG PO TBEC
81.0000 mg | DELAYED_RELEASE_TABLET | Freq: Every day | ORAL | 0 refills | Status: AC
Start: 2016-04-14 — End: ?
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
## 2016-04-14 NOTE — Progress Notes (Addendum)
Spoke to Dr Benjie Karvonen patient was weak ambulating outside room, she said she is discharging patient with walker and home health

## 2016-04-14 NOTE — Care Management (Addendum)
Rolling walker requested from [facility] which will be shipped to patient's home address tomorrow. I have notified patient and she states that her son and daughter will help in the home until walker is delivered- she is not concerned about that. No further RNCM needs.

## 2016-04-14 NOTE — Progress Notes (Signed)
Patient is aware the walker will not be delivered until Tomorrow but said she is fine and son and daughter will be staying with her to help her out

## 2016-04-14 NOTE — Care Management (Signed)
Spoke with patient. She states she "is still very dizzy and cannot get to the bathroom by herself". She is not sure if she will need a walker for home or not "because I haven't been up". She is aware now that she has a discharge order in place. She has requested home PT through [facility] and I have notified Corene Cornea with [facility] of patient discharge. She may have insurance that is getting ready to terminate. Please call RNCM if it is determined that patient will need a walker prior to discharge [phone].

## 2016-04-14 NOTE — Progress Notes (Signed)
[facility] [place] NAME: [name]    MR#:  [id number]  DATE OF BIRTH:  26-Sep-1960  SUBJECTIVE:   Patient with anxiety  reports nausea this am Wants to try soft diet  REVIEW OF SYSTEMS:    Review of Systems  Constitutional: Negative for chills, fever and malaise/fatigue.  HENT: Negative.  Negative for ear discharge, ear pain, hearing loss, nosebleeds and sore throat.   Eyes: Negative.  Negative for blurred vision and pain.  Respiratory: Negative.  Negative for cough, hemoptysis, shortness of breath and wheezing.   Cardiovascular: Negative.  Negative for chest pain, palpitations and leg swelling.  Gastrointestinal: Positive for abdominal pain, nausea and vomiting. Negative for blood in stool and diarrhea.  Genitourinary: Negative.  Negative for dysuria.  Musculoskeletal: Negative.  Negative for back pain.  Skin: Negative.   Neurological: Negative for dizziness, tremors, speech change, focal weakness, seizures and headaches.  Endo/Heme/Allergies: Negative.  Does not bruise/bleed easily.  Psychiatric/Behavioral: Negative.  Negative for depression, hallucinations and suicidal ideas.    Tolerating Diet:some clears     DRUG ALLERGIES:   Allergies  Allergen Reactions  . Lidocaine   . Neurontin [Gabapentin]     Dizziness, Confusion   . Pregabalin Other (See Comments)    'bad reaction' hallucinations and acting crazy after taking Lyrica  . Shellfish Allergy Nausea And Vomiting    VITALS:  Blood pressure 134/71, pulse 75, temperature 98.2 F (36.8 C), temperature source Oral, resp. rate 20, height 5\' 3"  (1.6 m), weight 74 kg (163 lb 4 oz), last menstrual period 02/03/2011, SpO2 93 %.  PHYSICAL EXAMINATION:   Physical Exam  Constitutional: She is oriented to person, place, and time and well-developed, well-nourished, and in no distress. No distress.  HENT:  Head: Normocephalic.  Eyes: No scleral icterus.  Neck: Normal range  of motion. Neck supple. No JVD present. No tracheal deviation present.  Cardiovascular: Normal rate, regular rhythm and normal heart sounds.  Exam reveals no gallop and no friction rub.   No murmur heard. Pulmonary/Chest: Effort normal and breath sounds normal. No respiratory distress. She has no wheezes. She has no rales. She exhibits no tenderness.  Abdominal: Soft. Bowel sounds are normal. She exhibits no distension and no mass. There is no rebound and no guarding.  Musculoskeletal: Normal range of motion. She exhibits no edema.  Neurological: She is alert and oriented to person, place, and time.  Skin: Skin is warm. No rash noted. No erythema.  Psychiatric: Affect and judgment normal.      LABORATORY PANEL:   CBC  Recent Labs Lab 04/10/16 0447  WBC 7.2  HGB 12.4  HCT 35.4  PLT 323   ------------------------------------------------------------------------------------------------------------------  Chemistries   Recent Labs Lab 04/09/16 1211  04/11/16 1424 04/13/16 0503  NA 138  < >  --  142  K 3.8  < >  --  3.8  CL 107  < >  --  111  CO2 22  < >  --  23  GLUCOSE 89  < >  --  82  BUN 9  < >  --  5*  CREATININE 1.09*  < >  --  1.00  CALCIUM 9.2  < >  --  8.6*  MG 1.7  --   --   --   AST  --   --  40  --   ALT  --   --  72*  --   ALKPHOS  --   --  117  --   BILITOT  --   --  0.8  --   < > = values in this interval not displayed. ------------------------------------------------------------------------------------------------------------------  Cardiac Enzymes No results for input(s): TROPONINI in the last 168 hours. ------------------------------------------------------------------------------------------------------------------  RADIOLOGY:  Ct Abdomen Pelvis W Contrast  Result Date: 04/12/2016 CLINICAL DATA:  Epigastric pain, nausea, and vomiting after eating, post cardiac catheterization and coronary stenting 1 week ago, hypertension, COPD, history MI,  smoker EXAM: CT ABDOMEN AND PELVIS WITH CONTRAST TECHNIQUE: Multidetector CT imaging of the abdomen and pelvis was performed using the standard protocol following bolus administration of intravenous contrast. Sagittal and coronal MPR images reconstructed from axial data set. CONTRAST:  185mL ISOVUE-300 IOPAMIDOL (ISOVUE-300) INJECTION 61% IV. Dilute oral contrast. COMPARISON:  06/30/2013 FINDINGS: Lower chest: Dependent bibasilar atelectasis. Gallbladder surgically absent. Liver unremarkable. Hepatobiliary: Normal appearance Pancreas: Normal appearance Spleen: Normal appearance.  Splenule inferior to spleen. Adrenals/Urinary Tract: Adrenal glands normal. Horseshoe kidney. No definite renal mass or hydronephrosis. Duplication of RIGHT ureter. No ureteral calculi. Bladder unremarkable. Stomach/Bowel: Normal appendix. Stomach and bowel loops normal appearance. Vascular/Lymphatic: Atherosclerotic calcification aorta without aneurysm. No adenopathy. Reproductive: Unremarkable uterus and adnexa Other: No free air or free fluid.  No hernia. Musculoskeletal: Bones demineralized. 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 No acute intra-abdominal or intrapelvic abnormalities. Aortic atherosclerosis. Electronically Signed   By: Lavonia Dana M.D.   On: 04/12/2016 14:18     ASSESSMENT AND PLAN:   55 year old female with history of CAD and recent stent placed at [facility] who presented with weakness and nausea with vomiting.  1. Intractable nausea/vomiting: KUB on admission was negative and LFTs were normal. CT scan of the abdomen was also negative She will continue Carafate in addition to PPI/ Zantac as well as Reglan for gastroparesis. I am suspecting some of her nausea is also from anxiety. She was evaluated by Gi and due to recent stent not a candidate for invasive procedures.  2. Generalized weakness: She can have home health  3. CAD with recent stent/NSTEMI; continue aspirin, Plavix, beta  blocker and statin   4. Ischemic cardiomyopathy EF of 35%: Continue beta blocker and add ACE inhibitor.   5. Anxiety: Continue when necessary medications   6. COPD without exacerbation: Continue inhalers  Management plans discussed with the patient and she is in agreement.  CODE STATUS: full  TOTAL TIME TAKING CARE OF THIS PATIENT: 24 minutes.     POSSIBLE D/C today if tolerates diet  Jeniece Hannis M.D on 04/14/2016 at 8:05 AM  Between 7am to 6pm - Pager - [phone] After 6pm go to [email or web address] - password [email or web address]  Office  [phone]  CC: Primary care physician; [name], MD  Note: This dictation was prepared with Dragon dictation along with smaller phrase technology. Any transcriptional errors that result from this process are unintentional.

## 2016-04-14 NOTE — Progress Notes (Signed)
Lucilla Lame, MD [facility]   [address] [place], [place] [address] Phone: [phone] Fax : [phone]   Subjective: The patient reports that she is less dizzy today and also reports that she has less nausea. The patient states she has nausea and vomiting since having her cardiac stent placed. The patient also has a diagnosis of gastroparesis. She reports that she was able to eat this morning and take more then sips of liquid with only mild nausea.   Objective: Vital signs in last 24 hours: Vitals:   04/13/16 2039 04/14/16 0421 04/14/16 0736 04/14/16 0939  BP: 140/64 134/71  (!) 148/80  Pulse: 74 75  86  Resp: (!) 21 20  17   Temp: 97.8 F (36.6 C) 98.2 F (36.8 C)    TempSrc: Oral Oral  Oral  SpO2: 95% 97% 93% 97%  Weight:      Height:       Weight change:   Intake/Output Summary (Last 24 hours) at 04/14/16 1118 Last data filed at 04/13/16 2215  Gross per 24 hour  Intake                0 ml  Output             1150 ml  Net            -1150 ml     Exam: Heart:: Regular rate and rhythm, S1S2 present or without murmur or extra heart sounds Lungs: normal and clear to auscultation and percussion Abdomen: soft, mild tenderness in the epigastric area, normal bowel sounds   Lab Results: @LABTEST2 @ Micro Results: Recent Results (from the past 240 hour(s))  MRSA PCR Screening     Status: None   Collection Time: 04/12/16 12:27 PM  Result Value Ref Range Status   MRSA by PCR NEGATIVE NEGATIVE Final    Comment:        The GeneXpert MRSA Assay (FDA approved for NASAL specimens only), is one component of a comprehensive MRSA colonization surveillance program. It is not intended to diagnose MRSA infection nor to guide or monitor treatment for MRSA infections.    Studies/Results: Ct Abdomen Pelvis W Contrast  Result Date: 04/12/2016 CLINICAL DATA:  Epigastric pain, nausea, and vomiting after eating, post cardiac catheterization and coronary stenting 1 week  ago, hypertension, COPD, history MI, smoker EXAM: CT ABDOMEN AND PELVIS WITH CONTRAST TECHNIQUE: Multidetector CT imaging of the abdomen and pelvis was performed using the standard protocol following bolus administration of intravenous contrast. Sagittal and coronal MPR images reconstructed from axial data set. CONTRAST:  135mL ISOVUE-300 IOPAMIDOL (ISOVUE-300) INJECTION 61% IV. Dilute oral contrast. COMPARISON:  06/30/2013 FINDINGS: Lower chest: Dependent bibasilar atelectasis. Gallbladder surgically absent. Liver unremarkable. Hepatobiliary: Normal appearance Pancreas: Normal appearance Spleen: Normal appearance.  Splenule inferior to spleen. Adrenals/Urinary Tract: Adrenal glands normal. Horseshoe kidney. No definite renal mass or hydronephrosis. Duplication of RIGHT ureter. No ureteral calculi. Bladder unremarkable. Stomach/Bowel: Normal appendix. Stomach and bowel loops normal appearance. Vascular/Lymphatic: Atherosclerotic calcification aorta without aneurysm. No adenopathy. Reproductive: Unremarkable uterus and adnexa Other: No free air or free fluid.  No hernia. Musculoskeletal: Bones demineralized. 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 No acute intra-abdominal or intrapelvic abnormalities. Aortic atherosclerosis. Electronically Signed   By: Lavonia Dana M.D.   On: 04/12/2016 14:18   Medications: I have reviewed the patient's current medications. Scheduled Meds: . aspirin EC  81 mg Oral Daily  . atorvastatin  80 mg Oral q1800  . clopidogrel  75 mg Oral Daily  . enoxaparin (LOVENOX) injection  40 mg Subcutaneous Q24H  . feeding supplement  1 Container Oral TID BM  . lisinopril  5 mg Oral Daily  . metoCLOPramide  5 mg Oral TID AC & HS  . metoprolol succinate  12.5 mg Oral Daily  . mometasone-formoterol  2 puff Inhalation BID  . pantoprazole  40 mg Oral BID AC  . polyethylene glycol  17 g Oral Daily  . protein supplement  8 oz Oral BID  . sodium chloride flush  3 mL  Intravenous Q12H  . sucralfate  1 g Oral TID WC & HS   Continuous Infusions: . sodium chloride 75 mL/hr at 04/13/16 2300   PRN Meds:.acetaminophen **OR** acetaminophen, albuterol, alum & mag hydroxide-simeth, butalbital-acetaminophen-caffeine, diphenhydrAMINE, HYDROcodone-acetaminophen, ipratropium, LORazepam, ondansetron **OR** ondansetron (ZOFRAN) IV, promethazine   Assessment: Active Problems:   Gastroenteritis   Protein-calorie malnutrition, severe   Intractable nausea and vomiting    Plan: This patient comes in with headaches and nausea with a diagnosis of gastroparesis. The patient states this all started when she had her cardiac issues. The patient is doing better today. She also states that she is going home today. The patient has been encouraged to follow up with her primary GI when she gets discharged.   LOS: 3 days   Shahed Yeoman 04/14/2016, 11:18 AM

## 2016-04-15 LAB — H PYLORI, IGM, IGG, IGA AB
H Pylori IgG: <0.9 U/mL (ref 0.0–0.8)
H. Pylogi, Iga Abs: <9 units (ref 0.0–8.9)

## 2016-04-21 DIAGNOSIS — I639 Cerebral infarction, unspecified: Secondary | ICD-10-CM

## 2016-04-21 HISTORY — DX: Cerebral infarction, unspecified: I63.9

## 2016-07-15 ENCOUNTER — Ambulatory Visit: Admit: 2016-07-15 | Payer: BLUE CROSS/BLUE SHIELD | Admitting: Gastroenterology

## 2016-07-15 SURGERY — COLONOSCOPY WITH PROPOFOL
Anesthesia: General

## 2016-09-22 ENCOUNTER — Inpatient Hospital Stay
Admission: EM | Admit: 2016-09-22 | Discharge: 2016-09-26 | DRG: 882 | Disposition: A | Discharge status: Skilled Nursing Facility | Payer: BLUE CROSS/BLUE SHIELD | Attending: Internal Medicine | Admitting: Internal Medicine

## 2016-09-22 ENCOUNTER — Encounter: Payer: Self-pay | Admitting: Emergency Medicine

## 2016-09-22 ENCOUNTER — Emergency Department: Payer: BLUE CROSS/BLUE SHIELD

## 2016-09-22 DIAGNOSIS — J449 Chronic obstructive pulmonary disease, unspecified: Secondary | ICD-10-CM | POA: Diagnosis present

## 2016-09-22 DIAGNOSIS — Z7982 Long term (current) use of aspirin: Secondary | ICD-10-CM

## 2016-09-22 DIAGNOSIS — M25552 Pain in left hip: Secondary | ICD-10-CM

## 2016-09-22 DIAGNOSIS — F329 Major depressive disorder, single episode, unspecified: Secondary | ICD-10-CM | POA: Diagnosis present

## 2016-09-22 DIAGNOSIS — R29810 Facial weakness: Secondary | ICD-10-CM | POA: Diagnosis present

## 2016-09-22 DIAGNOSIS — Z7902 Long term (current) use of antithrombotics/antiplatelets: Secondary | ICD-10-CM | POA: Diagnosis not present

## 2016-09-22 DIAGNOSIS — I10 Essential (primary) hypertension: Secondary | ICD-10-CM | POA: Diagnosis present

## 2016-09-22 DIAGNOSIS — Z888 Allergy status to other drugs, medicaments and biological substances status: Secondary | ICD-10-CM | POA: Diagnosis not present

## 2016-09-22 DIAGNOSIS — Z9049 Acquired absence of other specified parts of digestive tract: Secondary | ICD-10-CM | POA: Diagnosis not present

## 2016-09-22 DIAGNOSIS — F17210 Nicotine dependence, cigarettes, uncomplicated: Secondary | ICD-10-CM | POA: Diagnosis present

## 2016-09-22 DIAGNOSIS — F419 Anxiety disorder, unspecified: Secondary | ICD-10-CM | POA: Diagnosis present

## 2016-09-22 DIAGNOSIS — F450 Somatization disorder: Secondary | ICD-10-CM

## 2016-09-22 DIAGNOSIS — R079 Chest pain, unspecified: Secondary | ICD-10-CM

## 2016-09-22 DIAGNOSIS — Z91013 Allergy to seafood: Secondary | ICD-10-CM

## 2016-09-22 DIAGNOSIS — Z825 Family history of asthma and other chronic lower respiratory diseases: Secondary | ICD-10-CM

## 2016-09-22 DIAGNOSIS — R55 Syncope and collapse: Secondary | ICD-10-CM | POA: Diagnosis present

## 2016-09-22 DIAGNOSIS — Z8249 Family history of ischemic heart disease and other diseases of the circulatory system: Secondary | ICD-10-CM | POA: Diagnosis not present

## 2016-09-22 DIAGNOSIS — R1310 Dysphagia, unspecified: Secondary | ICD-10-CM | POA: Diagnosis present

## 2016-09-22 DIAGNOSIS — Z800 Family history of malignant neoplasm of digestive organs: Secondary | ICD-10-CM

## 2016-09-22 DIAGNOSIS — K219 Gastro-esophageal reflux disease without esophagitis: Secondary | ICD-10-CM | POA: Diagnosis present

## 2016-09-22 DIAGNOSIS — Z884 Allergy status to anesthetic agent status: Secondary | ICD-10-CM | POA: Diagnosis not present

## 2016-09-22 DIAGNOSIS — Z7951 Long term (current) use of inhaled steroids: Secondary | ICD-10-CM

## 2016-09-22 DIAGNOSIS — R29716 NIHSS score 16: Secondary | ICD-10-CM | POA: Diagnosis present

## 2016-09-22 DIAGNOSIS — R531 Weakness: Secondary | ICD-10-CM | POA: Diagnosis not present

## 2016-09-22 DIAGNOSIS — I252 Old myocardial infarction: Secondary | ICD-10-CM | POA: Diagnosis not present

## 2016-09-22 DIAGNOSIS — Z955 Presence of coronary angioplasty implant and graft: Secondary | ICD-10-CM

## 2016-09-22 DIAGNOSIS — M25512 Pain in left shoulder: Secondary | ICD-10-CM

## 2016-09-22 DIAGNOSIS — K5900 Constipation, unspecified: Secondary | ICD-10-CM | POA: Diagnosis present

## 2016-09-22 DIAGNOSIS — I6350 Cerebral infarction due to unspecified occlusion or stenosis of unspecified cerebral artery: Secondary | ICD-10-CM | POA: Diagnosis not present

## 2016-09-22 DIAGNOSIS — I639 Cerebral infarction, unspecified: Secondary | ICD-10-CM | POA: Diagnosis present

## 2016-09-22 DIAGNOSIS — I25119 Atherosclerotic heart disease of native coronary artery with unspecified angina pectoris: Secondary | ICD-10-CM | POA: Diagnosis present

## 2016-09-22 DIAGNOSIS — R4781 Slurred speech: Secondary | ICD-10-CM | POA: Diagnosis present

## 2016-09-22 DIAGNOSIS — Z8261 Family history of arthritis: Secondary | ICD-10-CM | POA: Diagnosis not present

## 2016-09-22 LAB — URINALYSIS, COMPLETE (UACMP) WITH MICROSCOPIC
BILIRUBIN URINE: NEGATIVE
Bacteria, UA: NONE SEEN
GLUCOSE, UA: NEGATIVE mg/dL
HGB URINE DIPSTICK: NEGATIVE
Ketones, ur: NEGATIVE mg/dL
Leukocytes, UA: NEGATIVE
NITRITE: NEGATIVE
PROTEIN: NEGATIVE mg/dL
Specific Gravity, Urine: 1.041 — ABNORMAL HIGH (ref 1.005–1.030)
pH: 6 (ref 5.0–8.0)

## 2016-09-22 LAB — DIFFERENTIAL
BASOS PCT: 1 %
Basophils Absolute: 0.1 10*3/uL (ref 0–0.1)
EOS ABS: 0.7 10*3/uL (ref 0–0.7)
EOS PCT: 7 %
Lymphocytes Relative: 20 %
Lymphs Abs: 2.2 10*3/uL (ref 1.0–3.6)
Monocytes Absolute: 0.7 10*3/uL (ref 0.2–0.9)
Monocytes Relative: 7 %
NEUTROS PCT: 65 %
Neutro Abs: 7.1 10*3/uL — ABNORMAL HIGH (ref 1.4–6.5)

## 2016-09-22 LAB — COMPREHENSIVE METABOLIC PANEL
ALBUMIN: 4 g/dL (ref 3.5–5.0)
ALT: 24 U/L (ref 14–54)
ANION GAP: 12 (ref 5–15)
AST: 23 U/L (ref 15–41)
Alkaline Phosphatase: 102 U/L (ref 38–126)
BUN: 18 mg/dL (ref 6–20)
CHLORIDE: 103 mmol/L (ref 101–111)
CO2: 24 mmol/L (ref 22–32)
Calcium: 9.6 mg/dL (ref 8.9–10.3)
Creatinine, Ser: 0.93 mg/dL (ref 0.44–1.00)
GFR calc Af Amer: >60 mL/min (ref >60)
GFR calc non Af Amer: >60 mL/min (ref >60)
Glucose, Bld: 111 mg/dL — ABNORMAL HIGH (ref 65–99)
POTASSIUM: 4 mmol/L (ref 3.5–5.1)
SODIUM: 139 mmol/L (ref 135–145)
TOTAL PROTEIN: 7.2 g/dL (ref 6.5–8.1)
Total Bilirubin: 0.4 mg/dL (ref 0.3–1.2)

## 2016-09-22 LAB — CBC
HCT: 41.5 % (ref 35.0–47.0)
HEMOGLOBIN: 14.1 g/dL (ref 12.0–16.0)
MCH: 30.5 pg (ref 26.0–34.0)
MCHC: 34.1 g/dL (ref 32.0–36.0)
MCV: 89.4 fL (ref 80.0–100.0)
PLATELETS: 271 10*3/uL (ref 150–440)
RBC: 4.64 MIL/uL (ref 3.80–5.20)
RDW: 13.4 % (ref 11.5–14.5)
WBC: 10.8 10*3/uL (ref 3.6–11.0)

## 2016-09-22 LAB — GLUCOSE, CAPILLARY: Glucose-Capillary: 105 mg/dL — ABNORMAL HIGH (ref 65–99)

## 2016-09-22 LAB — APTT: APTT: 27 s (ref 24–36)

## 2016-09-22 LAB — PROTIME-INR
INR: 1.03
PROTHROMBIN TIME: 13.5 s (ref 11.4–15.2)

## 2016-09-22 LAB — LIPID PANEL
CHOL/HDL RATIO: 3.4 ratio
CHOLESTEROL: 147 mg/dL (ref 0–200)
HDL: 43 mg/dL (ref >40)
LDL Cholesterol: 66 mg/dL (ref 0–99)
TRIGLYCERIDES: 191 mg/dL — AB (ref <150)
VLDL: 38 mg/dL (ref 0–40)

## 2016-09-22 LAB — TROPONIN I: Troponin I: <0.03 ng/mL (ref <0.03)

## 2016-09-22 MED ORDER — SODIUM CHLORIDE 0.9 % IV SOLN
INTRAVENOUS | Status: DC
  Administered 2016-09-22 – 2016-09-24 (×3): via INTRAVENOUS

## 2016-09-22 MED ORDER — DULOXETINE HCL 30 MG PO CPEP: 60.0000 mg | ORAL_CAPSULE | Freq: Every day | ORAL | Status: DC

## 2016-09-22 MED ORDER — FLUTICASONE PROPIONATE 50 MCG/ACT NA SUSP
1.0000 | Freq: Every day | NASAL | Status: DC
  Filled 2016-09-22: qty 16

## 2016-09-22 MED ORDER — FAMOTIDINE 20 MG PO TABS
20.0000 mg | ORAL_TABLET | Freq: Every day | ORAL | Status: DC
  Filled 2016-09-22: qty 1

## 2016-09-22 MED ORDER — ASPIRIN 81 MG PO CHEW: 324.0000 mg | CHEWABLE_TABLET | Freq: Once | ORAL | Status: DC

## 2016-09-22 MED ORDER — PANTOPRAZOLE SODIUM 40 MG PO TBEC: 40.0000 mg | DELAYED_RELEASE_TABLET | Freq: Every day | ORAL | Status: DC

## 2016-09-22 MED ORDER — PANTOPRAZOLE SODIUM 40 MG IV SOLR
40.0000 mg | INTRAVENOUS | Status: DC
  Administered 2016-09-22: 22:00:00 40 mg via INTRAVENOUS
  Filled 2016-09-22: qty 40

## 2016-09-22 MED ORDER — DIAZEPAM 5 MG PO TABS
5.0000 mg | ORAL_TABLET | Freq: Two times a day (BID) | ORAL | Status: DC
  Filled 2016-09-22: qty 1

## 2016-09-22 MED ORDER — IPRATROPIUM-ALBUTEROL 0.5-2.5 (3) MG/3ML IN SOLN
3.0000 mL | RESPIRATORY_TRACT | Status: DC | PRN
  Administered 2016-09-25: 3 mL via RESPIRATORY_TRACT
  Filled 2016-09-22: qty 3

## 2016-09-22 MED ORDER — OXYCODONE HCL 5 MG PO TABS
5.0000 mg | ORAL_TABLET | Freq: Four times a day (QID) | ORAL | Status: DC | PRN
  Filled 2016-09-22: qty 1

## 2016-09-22 MED ORDER — ASPIRIN 300 MG RE SUPP
RECTAL | Status: AC
  Filled 2016-09-22: qty 1

## 2016-09-22 MED ORDER — ASPIRIN EC 81 MG PO TBEC: 81.0000 mg | DELAYED_RELEASE_TABLET | Freq: Every day | ORAL | Status: DC

## 2016-09-22 MED ORDER — IOPAMIDOL (ISOVUE-370) INJECTION 76%: 125.0000 mL | Freq: Once | INTRAVENOUS | Status: DC | PRN

## 2016-09-22 MED ORDER — LOSARTAN POTASSIUM 50 MG PO TABS
100.0000 mg | ORAL_TABLET | Freq: Every day | ORAL | Status: DC
  Filled 2016-09-22: qty 2

## 2016-09-22 MED ORDER — LORAZEPAM 1 MG PO TABS: 1.0000 mg | ORAL_TABLET | Freq: Once | ORAL | Status: DC

## 2016-09-22 MED ORDER — LORATADINE 10 MG PO TABS: 10.0000 mg | ORAL_TABLET | Freq: Every day | ORAL | Status: DC

## 2016-09-22 MED ORDER — MORPHINE SULFATE (PF) 2 MG/ML IV SOLN
2.0000 mg | INTRAVENOUS | Status: DC | PRN
  Administered 2016-09-22 – 2016-09-23 (×2): 2 mg via INTRAVENOUS
  Filled 2016-09-22 (×2): qty 1

## 2016-09-22 MED ORDER — ONDANSETRON HCL 4 MG PO TABS: 4.0000 mg | ORAL_TABLET | Freq: Four times a day (QID) | ORAL | Status: DC | PRN

## 2016-09-22 MED ORDER — LORAZEPAM 1 MG PO TABS: 1.0000 mg | ORAL_TABLET | Freq: Four times a day (QID) | ORAL | Status: DC | PRN

## 2016-09-22 MED ORDER — ASPIRIN 300 MG RE SUPP
300.0000 mg | Freq: Once | RECTAL | Status: AC
  Administered 2016-09-22: 300 mg via RECTAL

## 2016-09-22 MED ORDER — CLOPIDOGREL BISULFATE 75 MG PO TABS: 75.0000 mg | ORAL_TABLET | Freq: Every day | ORAL | Status: DC

## 2016-09-22 MED ORDER — TRAZODONE HCL 50 MG PO TABS
50.0000 mg | ORAL_TABLET | Freq: Every day | ORAL | Status: DC
  Filled 2016-09-22: qty 1

## 2016-09-22 MED ORDER — ENOXAPARIN SODIUM 40 MG/0.4ML ~~LOC~~ SOLN
40.0000 mg | SUBCUTANEOUS | Status: DC
  Administered 2016-09-22 – 2016-09-25 (×4): 40 mg via SUBCUTANEOUS
  Filled 2016-09-22 (×4): qty 0.4

## 2016-09-22 MED ORDER — ACETAMINOPHEN 325 MG PO TABS
650.0000 mg | ORAL_TABLET | Freq: Four times a day (QID) | ORAL | Status: DC | PRN
  Administered 2016-09-24 – 2016-09-25 (×2): 650 mg via ORAL
  Filled 2016-09-22: qty 2

## 2016-09-22 MED ORDER — METOPROLOL SUCCINATE ER 25 MG PO TB24: 12.5000 mg | ORAL_TABLET | Freq: Every day | ORAL | Status: DC

## 2016-09-22 MED ORDER — SUCRALFATE 1 GM/10ML PO SUSP
1.0000 g | Freq: Three times a day (TID) | ORAL | Status: DC
  Filled 2016-09-22: qty 10

## 2016-09-22 MED ORDER — ONDANSETRON HCL 4 MG/2ML IJ SOLN
4.0000 mg | Freq: Four times a day (QID) | INTRAMUSCULAR | Status: DC | PRN
  Administered 2016-09-23 – 2016-09-26 (×2): 4 mg via INTRAVENOUS
  Filled 2016-09-22 (×2): qty 2

## 2016-09-22 MED ORDER — MOMETASONE FURO-FORMOTEROL FUM 200-5 MCG/ACT IN AERO
2.0000 | INHALATION_SPRAY | Freq: Two times a day (BID) | RESPIRATORY_TRACT | Status: DC
  Administered 2016-09-22 – 2016-09-26 (×8): 2 via RESPIRATORY_TRACT
  Filled 2016-09-22: qty 8.8

## 2016-09-22 MED ORDER — LORAZEPAM 2 MG/ML IJ SOLN
0.5000 mg | Freq: Four times a day (QID) | INTRAMUSCULAR | Status: DC | PRN
  Administered 2016-09-23: 12:00:00 0.5 mg via INTRAVENOUS
  Filled 2016-09-22: qty 1

## 2016-09-22 MED ORDER — ATORVASTATIN CALCIUM 80 MG PO TABS: 80.0000 mg | ORAL_TABLET | Freq: Every day | ORAL | Status: DC

## 2016-09-22 MED ORDER — LORAZEPAM 2 MG/ML IJ SOLN
0.5000 mg | Freq: Once | INTRAMUSCULAR | Status: AC
  Administered 2016-09-22: 0.5 mg via INTRAVENOUS
  Filled 2016-09-22: qty 1

## 2016-09-22 MED ORDER — ACETAMINOPHEN 650 MG RE SUPP: 650.0000 mg | Freq: Four times a day (QID) | RECTAL | Status: DC | PRN
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
## 2016-09-22 NOTE — ED Notes (Signed)
SOC in progress.  

## 2016-09-22 NOTE — ED Notes (Signed)
Pt stated she needed to urinate. Placed on bedpan and informed to hit call bell when she was finished because she stated it would take her a minute. Pt was able to use R leg to lift to get bedpan underneath pt.

## 2016-09-22 NOTE — Progress Notes (Signed)
Chaplain received page. Patient was distressed due to the fact that her speech was slurred and she could not form her words properly. I prayed for her and stayed with her as long as I could  About 35 minutes. It had been a very busy night with several codes.

## 2016-09-22 NOTE — ED Notes (Addendum)
Pt brought from [facility] for witnessed syncopal episode. Noticed L sided weakness and facial droop. Brought over to ED on stretcher. Pt words are slurred, not incomprehensible but hard to understand. Pt pointing to chest and head for pain. No vision problems. L sided drift and facial droop. Pt crying and tearful. Pt keeps asking to call son. Called twice with no response.   Pt states MI in December with stents placed. States spinal tap in December as well.

## 2016-09-22 NOTE — ED Notes (Signed)
When pt was brought from [facility] pt had red rash noted to chest and neck.

## 2016-09-22 NOTE — ED Notes (Signed)
Was able to get in touch with son, Ovid Curd, [phone] and informed him that his mom is in ED. Pt had been continuously asking for son because she is scared. Son said he is coming to see her.
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
## 2016-09-22 NOTE — Progress Notes (Signed)
Pt failed swallow study, d/c'd oral meds, will need these restarted once swallow eval and recommendations are in.  Jacqulyn Bath [facility] 09/22/2016, 9:35 PM

## 2016-09-22 NOTE — ED Notes (Addendum)
Pt states she does NOT want TPA   Pt able to write sentences on paper.

## 2016-09-22 NOTE — ED Notes (Signed)
CODE STROKE CALLED TO [phone] 

## 2016-09-23 ENCOUNTER — Inpatient Hospital Stay: Payer: BLUE CROSS/BLUE SHIELD

## 2016-09-23 DIAGNOSIS — F450 Somatization disorder: Secondary | ICD-10-CM

## 2016-09-23 DIAGNOSIS — R531 Weakness: Secondary | ICD-10-CM

## 2016-09-23 LAB — CBC
HEMATOCRIT: 38.9 % (ref 35.0–47.0)
Hemoglobin: 13.3 g/dL (ref 12.0–16.0)
MCH: 30.6 pg (ref 26.0–34.0)
MCHC: 34.1 g/dL (ref 32.0–36.0)
MCV: 89.7 fL (ref 80.0–100.0)
Platelets: 245 10*3/uL (ref 150–440)
RBC: 4.34 MIL/uL (ref 3.80–5.20)
RDW: 13.3 % (ref 11.5–14.5)
WBC: 8.1 10*3/uL (ref 3.6–11.0)

## 2016-09-23 LAB — BASIC METABOLIC PANEL
Anion gap: 7 (ref 5–15)
BUN: 16 mg/dL (ref 6–20)
CALCIUM: 9 mg/dL (ref 8.9–10.3)
CO2: 28 mmol/L (ref 22–32)
CREATININE: 0.92 mg/dL (ref 0.44–1.00)
Chloride: 105 mmol/L (ref 101–111)
GFR calc non Af Amer: >60 mL/min (ref >60)
Glucose, Bld: 107 mg/dL — ABNORMAL HIGH (ref 65–99)
Potassium: 4.1 mmol/L (ref 3.5–5.1)
Sodium: 140 mmol/L (ref 135–145)

## 2016-09-23 MED ORDER — CLOPIDOGREL BISULFATE 75 MG PO TABS
75.0000 mg | ORAL_TABLET | Freq: Every day | ORAL | Status: DC
  Administered 2016-09-23 – 2016-09-26 (×4): 75 mg via ORAL
  Filled 2016-09-23 (×4): qty 1

## 2016-09-23 MED ORDER — MECLIZINE HCL 25 MG PO TABS: 12.5000 mg | ORAL_TABLET | Freq: Three times a day (TID) | ORAL | Status: DC | PRN

## 2016-09-23 MED ORDER — ATORVASTATIN CALCIUM 20 MG PO TABS
80.0000 mg | ORAL_TABLET | Freq: Every day | ORAL | Status: DC
  Administered 2016-09-23 – 2016-09-26 (×4): 80 mg via ORAL
  Filled 2016-09-23 (×4): qty 4

## 2016-09-23 MED ORDER — KETOROLAC TROMETHAMINE 15 MG/ML IJ SOLN
15.0000 mg | Freq: Four times a day (QID) | INTRAMUSCULAR | Status: DC | PRN
Start: 2016-09-23 — End: 2016-09-26
  Administered 2016-09-23 – 2016-09-25 (×4): 15 mg via INTRAVENOUS
  Filled 2016-09-23 (×6): qty 1

## 2016-09-23 MED ORDER — IBUPROFEN 400 MG PO TABS
400.0000 mg | ORAL_TABLET | Freq: Four times a day (QID) | ORAL | Status: DC | PRN
  Administered 2016-09-23: 12:00:00 400 mg via ORAL
  Filled 2016-09-23: qty 1

## 2016-09-23 MED ORDER — ASPIRIN 325 MG PO TABS
325.0000 mg | ORAL_TABLET | Freq: Every day | ORAL | Status: DC
  Administered 2016-09-23 – 2016-09-26 (×4): 325 mg via ORAL
  Filled 2016-09-23 (×4): qty 1

## 2016-09-23 MED ORDER — DIAZEPAM 5 MG PO TABS
5.0000 mg | ORAL_TABLET | Freq: Two times a day (BID) | ORAL | Status: DC
  Administered 2016-09-23 – 2016-09-26 (×7): 5 mg via ORAL
  Filled 2016-09-23 (×7): qty 1

## 2016-09-23 MED ORDER — IBUPROFEN 400 MG PO TABS
400.0000 mg | ORAL_TABLET | Freq: Four times a day (QID) | ORAL | Status: DC | PRN
  Administered 2016-09-23 – 2016-09-26 (×6): 400 mg via ORAL
  Filled 2016-09-23 (×6): qty 1
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
## 2016-09-23 NOTE — Progress Notes (Signed)
[facility] NAME: [name]    MR#:  [id number]  DATE OF BIRTH:  04/02/1961  SUBJECTIVE:  CHIEF COMPLAINT:   Chief Complaint  Patient presents with  . Loss of Consciousness    Whispering now. Unable to speak in AM Still has left weakness Left hip and shoulder pain since falling  REVIEW OF SYSTEMS:    Review of Systems  Constitutional: Positive for malaise/fatigue. Negative for chills and fever.  HENT: Negative for sore throat.   Eyes: Negative for blurred vision, double vision and pain.  Respiratory: Negative for cough, hemoptysis, shortness of breath and wheezing.   Cardiovascular: Negative for chest pain, palpitations, orthopnea and leg swelling.  Gastrointestinal: Negative for abdominal pain, constipation, diarrhea, heartburn, nausea and vomiting.  Genitourinary: Negative for dysuria and hematuria.  Musculoskeletal: Positive for back pain, falls and joint pain.  Skin: Negative for rash.  Neurological: Positive for dizziness, speech change, focal weakness, weakness and headaches. Negative for sensory change.  Endo/Heme/Allergies: Does not bruise/bleed easily.  Psychiatric/Behavioral: Negative for depression. The patient is not nervous/anxious.     DRUG ALLERGIES:   Allergies  Allergen Reactions  . Lidocaine   . Neurontin [Gabapentin]     Dizziness, Confusion   . Pregabalin Other (See Comments)    'bad reaction' hallucinations and acting crazy after taking Lyrica  . Shellfish Allergy Nausea And Vomiting    VITALS:  Blood pressure 140/62, pulse 74, temperature 98.6 F (37 C), temperature source Oral, resp. rate 20, height 5\' 6"  (1.676 m), weight 72.6 kg (160 lb), last menstrual period 02/03/2011, SpO2 100 %.  PHYSICAL EXAMINATION:   Physical Exam  GENERAL:  56 y.o.-year-old patient lying in the bed with no acute distress.  EYES: Pupils equal, round, reactive to light and accommodation. No scleral icterus.  Extraocular muscles intact.  HEENT: Head atraumatic, normocephalic. Oropharynx and nasopharynx clear.  NECK:  Supple, no jugular venous distention. No thyroid enlargement, no tenderness.  LUNGS: Normal breath sounds bilaterally, no wheezing, rales, rhonchi. No use of accessory muscles of respiration.  CARDIOVASCULAR: S1, S2 normal. No murmurs, rubs, or gallops.  ABDOMEN: Soft, nontender, nondistended. Bowel sounds present. No organomegaly or mass.  EXTREMITIES: No cyanosis, clubbing or edema b/l.    NEUROLOGIC: Cranial nerves II through XII are intact.  Left weakness.  PSYCHIATRIC: The patient is alert and oriented x 3.  SKIN: No obvious rash, lesion, or ulcer.  Left hip and shoulder tenderness  LABORATORY PANEL:   CBC  Recent Labs Lab 09/23/16 0426  WBC 8.1  HGB 13.3  HCT 38.9  PLT 245   ------------------------------------------------------------------------------------------------------------------ Chemistries   Recent Labs Lab 09/22/16 1605 09/23/16 0426  NA 139 140  K 4.0 4.1  CL 103 105  CO2 24 28  GLUCOSE 111* 107*  BUN 18 16  CREATININE 0.93 0.92  CALCIUM 9.6 9.0  AST 23  --   ALT 24  --   ALKPHOS 102  --   BILITOT 0.4  --    ------------------------------------------------------------------------------------------------------------------  Cardiac Enzymes  Recent Labs Lab 09/22/16 1605  TROPONINI <0.03   ------------------------------------------------------------------------------------------------------------------  RADIOLOGY:  Ct Angio Head W Or Wo Contrast  Result Date: 09/22/2016 CLINICAL DATA:  Acute chest pain and facial droop, concern for dissection. Syncope with LEFT arm weakness. EXAM: CT ANGIOGRAPHY HEAD AND NECK TECHNIQUE: Multidetector CT imaging of the head and neck was performed using the standard protocol during bolus administration of intravenous contrast. Multiplanar CT image reconstructions and  MIPs were obtained to evaluate the  vascular anatomy. Carotid stenosis measurements (when applicable) are obtained utilizing NASCET criteria, using the distal internal carotid diameter as the denominator. CONTRAST:  125 mL Isovue 370 for the chest and craniocerebral circulation COMPARISON:  CT head  reported separately. FINDINGS: CTA NECK Aortic arch: Standard branching. Imaged portion shows no evidence of aneurysm or dissection. No significant stenosis of the major arch vessel origins. Right carotid system: Mild calcific plaque at the bifurcation. No evidence of dissection, stenosis (50% or greater) or occlusion. Left carotid system: Mild calcific plaque at the bifurcation. No evidence of dissection, stenosis (50% or greater) or occlusion. Vertebral arteries: RIGHT dominant. High-grade stenosis at the origin LEFT vertebral, reference image 103 series 7 coronal. RIGHT vertebral widely patent and sole contributor to the basilar. Neither demonstrate features concerning for dissection. Nonvascular soft tissues: Lungs reported separately. No neck masses. Airway midline. Subcentimeter RIGHT thyroid nodule. Cervical spondylosis worst at C5-C6. CTA HEAD Anterior circulation: Minor cavernous calcification in the carotid ICA segments without flow limiting stenosis. ICA termini widely patent. No proximal stenosis of the anterior or middle cerebral arteries. ACA vessels codominant. No MCA branch occlusion. No proximal occlusion, aneurysm, or vascular malformation. Posterior circulation: RIGHT vertebral dominant. LEFT vertebral terminates in PICA. No significant stenosis, proximal occlusion, aneurysm, or vascular malformation. Venous sinuses: As permitted by contrast timing, patent. Anatomic variants: None of significance. Delayed phase:   No abnormal intracranial enhancement. Review of the MIP images confirms the above findings. IMPRESSION: Minor carotid extracranial and intracranial atherosclerotic change, without evidence for flow-limiting stenosis,  dissection, or intracranial large vessel emergent occlusion. 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 This vessel terminates in PICA, and is likely unrelated to the current symptoms. Electronically Signed   By: Staci Righter M.D.   On: 09/22/2016 16:56   Ct Angio Neck W And/or Wo Contrast  Result Date: 09/22/2016 CLINICAL DATA:  Acute chest pain and facial droop, concern for dissection. Syncope with LEFT arm weakness. EXAM: CT ANGIOGRAPHY HEAD AND NECK TECHNIQUE: Multidetector CT imaging of the head and neck was performed using the standard protocol during bolus administration of intravenous contrast. Multiplanar CT image reconstructions and MIPs were obtained to evaluate the vascular anatomy. Carotid stenosis measurements (when applicable) are obtained utilizing NASCET criteria, using the distal internal carotid diameter as the denominator. CONTRAST:  125 mL Isovue 370 for the chest and craniocerebral circulation COMPARISON:  CT head  reported separately. FINDINGS: CTA NECK Aortic arch: Standard branching. Imaged portion shows no evidence of aneurysm or dissection. No significant stenosis of the major arch vessel origins. Right carotid system: Mild calcific plaque at the bifurcation. No evidence of dissection, stenosis (50% or greater) or occlusion. Left carotid system: Mild calcific plaque at the bifurcation. No evidence of dissection, stenosis (50% or greater) or occlusion. Vertebral arteries: RIGHT dominant. High-grade stenosis at the origin LEFT vertebral, reference image 103 series 7 coronal. RIGHT vertebral widely patent and sole contributor to the basilar. Neither demonstrate features concerning for dissection. Nonvascular soft tissues: Lungs reported separately. No neck masses. Airway midline. Subcentimeter RIGHT thyroid nodule. Cervical spondylosis worst at C5-C6. CTA HEAD Anterior circulation: Minor cavernous calcification in the carotid ICA  segments without flow limiting stenosis. ICA termini widely patent. No proximal stenosis of the anterior or middle cerebral arteries. ACA vessels codominant. No MCA branch occlusion. No proximal occlusion, aneurysm, or vascular malformation. Posterior circulation: RIGHT vertebral dominant. LEFT vertebral terminates in PICA. No significant stenosis,  proximal occlusion, aneurysm, or vascular malformation. Venous sinuses: As permitted by contrast timing, patent. Anatomic variants: None of significance. Delayed phase:   No abnormal intracranial enhancement. Review of the MIP images confirms the above findings. IMPRESSION: Minor carotid extracranial and intracranial atherosclerotic change, without evidence for flow-limiting stenosis, dissection, or intracranial large vessel emergent occlusion. 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 This vessel terminates in PICA, and is likely unrelated to the current symptoms. Electronically Signed   By: Staci Righter M.D.   On: 09/22/2016 16:56   Mr Brain Wo Contrast  Result Date: 09/23/2016 CLINICAL DATA:  56 year old female who presented after an episode of slurred speech and left side weakness. EXAM: MRI HEAD WITHOUT CONTRAST TECHNIQUE: Multiplanar, multiecho pulse sequences of the brain and surrounding structures were obtained without intravenous contrast. COMPARISON:  CTA head and neck 09/22/2016. [facility] Brain MRI 07/08/2009 FINDINGS: Brain: Cerebral volume is stable and within normal limits. No restricted diffusion to suggest acute infarction. No midline shift, mass effect, evidence of mass lesion, ventriculomegaly, extra-axial collection or acute intracranial hemorrhage. Cervicomedullary junction and pituitary are within normal limits. Pearline Cables and white matter signal is stable and within normal limits for age throughout the brain. No cortical encephalomalacia or chronic cerebral blood products are identified.  Vascular: Major intracranial vascular flow voids are preserved and appear stable. Skull and upper cervical spine: Negative. Sinuses/Orbits: Normal orbits soft tissues. Widespread paranasal sinus mucosal thickening and opacification has progressed since 2011, now with complete bilateral frontal and anterior ethmoid opacification. Other: Mastoid air cells remain clear. Grossly normal visible internal auditory structures. Negative scalp soft tissues. IMPRESSION: 1. No acute intracranial abnormality. Stable and normal for age noncontrast MRI appearance of the brain. 2. Progressed chronic paranasal sinus disease. Electronically Signed   By: Genevie Ann M.D.   On: 09/23/2016 09:30   Ct Angio Chest Aorta W And/or Wo Contrast  Result Date: 09/22/2016 CLINICAL DATA:  Acute chest pain, facial droop, concern for dissection EXAM: CT ANGIOGRAPHY CHEST WITH CONTRAST TECHNIQUE: Multidetector CT imaging of the chest was performed using the standard protocol before and during bolus administration of intravenous contrast. Multiplanar CT image reconstructions and MIPs were obtained to evaluate the vascular anatomy. CONTRAST:  65 mL Isovue-300 IV COMPARISON:  07/04/2004 FINDINGS: Cardiovascular: Preferential opacification of the thoracic aorta. No evidence of thoracic aortic aneurysm or dissection. Normal heart size. No pericardial effusion. Scattered coronary calcifications. Three vessel brachiocephalic arterial origin anatomy with partially calcified plaque at the left subclavian artery origin. Mediastinum/Nodes: No enlarged mediastinal, hilar, or axillary lymph nodes. Thyroid gland, trachea, and esophagus demonstrate no significant findings. Lungs/Pleura: Lungs are clear. No pleural effusion or pneumothorax. Upper Abdomen: Previous cholecystectomy.  No acute findings. Musculoskeletal: No chest wall abnormality. No acute or significant osseous findings. Review of the MIP images confirms the above findings. IMPRESSION: 1. Negative for  aortic dissection or aneurysm. 2. Scattered coronary calcifications and calcified plaque at the origin of the left subclavian artery. Electronically Signed   By: Lucrezia Europe M.D.   On: 09/22/2016 16:48   Ct Head Code Stroke W/o Cm  Result Date: 09/22/2016 CLINICAL DATA:  Code stroke. Syncope. Slurred speech. Left arm weakness and chest pain. EXAM: CT HEAD WITHOUT CONTRAST TECHNIQUE: Contiguous axial images were obtained from the base of the skull through the vertex without intravenous contrast. COMPARISON:  07/08/2009 FINDINGS: Brain: No evidence of acute infarction, hemorrhage, hydrocephalus, extra-axial collection or mass lesion/mass effect. Dilated perivascular space below  the right putamen. Vascular: Pending CTA. No hyperdense vessel. Arterial calcification. Skull: No acute finding. Sinuses/Orbits: Multifocal advanced mucosal thickening in the paranasal sinuses, especially progressed in the frontal sinuses compared to prior. Other: These results were called by telephone at the time of interpretation on 09/22/2016 at 4:44 pm to Dr. Merlyn Lot , who verbally acknowledged these results. ASPECTS [facility] Stroke Program Early CT Score) - Ganglionic level infarction (caudate, lentiform nuclei, internal capsule, insula, M1-M3 cortex): 7 - Supraganglionic infarction (M4-M6 cortex): 3 Total score (0-10 with 10 being normal): 10 IMPRESSION: 1. No acute intracranial finding. ASPECTS is 10. 2. Bilateral sinusitis. Electronically Signed   By: Monte Fantasia M.D.   On: 09/22/2016 16:46     ASSESSMENT AND PLAN:   * Acute left weakness and dysarthria - Not CVA MRI negative for CVA Refused tPA in ED. Now On ASA, Plavix, Statin Seen by neurology and suggested a psych consult. Symptoms are improving after valium Etiology unclear but could be psychogenic.  * h/o CAD On home meds  * Anxiety  * COPD  Check left hip and shoulder xarys  Explained to patient she will need PEG tube if no improvement of  symptoms with dysphagia  All the records are reviewed and case discussed with Care Management/Social Workerr. Management plans discussed with the patient, family and they are in agreement.  CODE STATUS: FULL CODE  DVT Prophylaxis: SCDs  TOTAL TIME TAKING CARE OF THIS PATIENT: 30 minutes.   POSSIBLE D/C IN 1-2 DAYS, DEPENDING ON CLINICAL CONDITION.  Hillary Bow R M.D on 09/23/2016 at 4:47 PM  Between 7am to 6pm - Pager - [phone]  After 6pm go to [email or web address] - password [email or web address] [facility]  Office  [phone]  CC: Primary care physician; Sharilyn Sites, MD  Note: This dictation was prepared with Dragon dictation along with smaller phrase technology. Any transcriptional errors that result from this process are unintentional.
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
## 2016-09-23 NOTE — Progress Notes (Signed)
Spoke with Dr Jannifer Franklin in regards to pt having gas pt came in with CVA NPO all meds were ordered IV pepid given IV Dr. Jannifer Franklin stated  it is nothing else he can order to control pt gas. Pt made aware.

## 2016-09-23 NOTE — Plan of Care (Signed)
Problem: SLP Dysphagia Goals Goal: Misc Dysphagia Goal Pt will safely tolerate po diet of least restrictive consistency w/ no overt s/s of aspiration noted by Staff/pt/family x3 sessions.    

## 2016-09-23 NOTE — Plan of Care (Signed)
Problem: Safety: Goal: Ability to remain free from injury will improve Outcome: Progressing A&Ox4. Speech clear, but whispering. MRI negative for CVA. Pain in jaws, headache and L hip managed with pain meds.  Psych consult pending.

## 2016-09-23 NOTE — Evaluation (Signed)
Physical Therapy Evaluation Patient Details Name: [name] MRN: [id number] DOB: 21-Jul-1960 Today's Date: 09/23/2016   History of Present Illness  presented to ER with acute onset of headache, L-sided weakness, reported loss of consciousness; admitted for TIA/CVA work up  Clinical Impression  Patient alert, oriented to basic information during session.  Appropriately follows multi-step commands, but demonstrates significant limitations in oral motor control which impairs ability to verbally express needs.  Excellent use of voice intonation, hand gestures to communicate needs.  L UE/LE generally weak (at least 2+/5) compared to R hemi-body; marked pain reported L hip.  Slightly tender to palpation, though difficult to fully localize.  With assessment, patient becomes somewhat anxious, requiring cuing for pursed lip breathing, relaxation techniques to calm. Unable to tolerate mobility this date due to L hip pain; will complete additional mobility assessment in next session as appropriate.  RN informed/awrae of L hip pain and potential benefit of xray to rule out bony injury Would benefit from skilled PT to address above deficits and promote optimal return to PLOF; recommend transition to STR upon discharge from acute hospitalization.     Follow Up Recommendations SNF    Equipment Recommendations       Recommendations for Other Services       Precautions / Restrictions Precautions Precautions: Fall Restrictions Weight Bearing Restrictions: No      Mobility  Bed Mobility               General bed mobility comments: unable to tolerate due to L hip pain  Transfers                 General transfer comment: unable to tolerate due to L hip pain  Ambulation/Gait             General Gait Details: unable to tolerate due to L hip pain  Stairs            Wheelchair Mobility    Modified Rankin (Stroke Patients Only)       Balance                                              Pertinent Vitals/Pain Pain Assessment: Faces Faces Pain Scale: Hurts whole lot Pain Location: headache, L hip Pain Descriptors / Indicators: Aching;Crying;Grimacing Pain Intervention(s): Limited activity within patient's tolerance;Monitored during session;Repositioned    Home Living Family/patient expects to be discharged to:: Private residence Living Arrangements: Alone Available Help at Discharge: Family;Available PRN/intermittently Type of Home: House Home Access: Stairs to enter Entrance Stairs-Rails: None Entrance Stairs-Number of Steps: 3 Home Layout: One level        Prior Function Level of Independence: Independent         Comments: Indep for ADLs, household and community mobility; denies fall history.  Working in some capacity with phones/computers.     Hand Dominance   Dominant Hand: Right    Extremity/Trunk Assessment   Upper Extremity Assessment Upper Extremity Assessment:  (R UE grossly WFL, L UE at least 3-/5)    Lower Extremity Assessment Lower Extremity Assessment:  (R LE grossly WFL, L LE generally 2+/5 limited by pain)       Communication   Communication: Other (comment) (very poor oral motor movement and verbal expression; follows simple and complex, multi-step commands without difficulty)  Cognition Arousal/Alertness: Awake/alert Behavior During Therapy: Anxious Overall Cognitive  Status: Difficult to assess                                        General Comments      Exercises Other Exercises Other Exercises: Reviewed pursed lip breathing and relaxation techniques for pain control; patient able to return demonstration.   Assessment/Plan    PT Assessment Patient needs continued PT services  PT Problem List Decreased strength;Decreased range of motion;Decreased activity tolerance;Decreased balance;Decreased mobility;Decreased coordination;Decreased knowledge of use of DME;Decreased  safety awareness;Decreased knowledge of precautions;Pain       PT Treatment Interventions DME instruction;Gait training;Stair training;Functional mobility training;Therapeutic activities;Therapeutic exercise;Balance training;Cognitive remediation;Neuromuscular re-education    PT Goals (Current goals can be found in the Care Plan section)  Acute Rehab PT Goals Patient Stated Goal: have less pain PT Goal Formulation: With patient Time For Goal Achievement: 09/24/16 Potential to Achieve Goals: Fair Additional Goals Additional Goal #1: Assess and establish goals for OOB Activities as appropriate    Frequency Min 2X/week   Barriers to discharge Decreased caregiver support      Co-evaluation               AM-PAC PT "6 Clicks" Daily Activity  Outcome Measure Difficulty turning over in bed (including adjusting bedclothes, sheets and blankets)?: Total Difficulty moving from lying on back to sitting on the side of the bed? : Total Difficulty sitting down on and standing up from a chair with arms (e.g., wheelchair, bedside commode, etc,.)?: Total Help needed moving to and from a bed to chair (including a wheelchair)?: Total Help needed walking in hospital room?: Total Help needed climbing 3-5 steps with a railing? : Total 6 Click Score: 6    End of Session   Activity Tolerance: Patient limited by pain Patient left: in bed;with call bell/phone within reach;with bed alarm set Nurse Communication: Mobility status (L hip pain, recommendation for L hip xray to rule out bony injury) PT Visit Diagnosis: History of falling (Z91.81);Muscle weakness (generalized) (M62.81)    Time: 1000-1016 PT Time Calculation (min) (ACUTE ONLY): 16 min   Charges:   PT Evaluation $PT Eval Moderate Complexity: 1 Procedure PT Treatments $Therapeutic Activity: 8-22 mins   PT G Codes:   PT G-Codes **NOT FOR INPATIENT CLASS** Functional Assessment Tool Used: AM-PAC 6 Clicks Basic Mobility Functional  Limitation: Mobility: Walking and moving around Mobility: Walking and Moving Around Current Status (V7793): At least 80 percent but less than 100 percent impaired, limited or restricted Mobility: Walking and Moving Around Goal Status [phone]): At least 20 percent but less than 40 percent impaired, limited or restricted   Kailon Treese H. Owens Shark, PT, DPT, NCS 09/23/16, 11:05 PM [phone]
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
## 2016-09-23 NOTE — Progress Notes (Signed)
OT Cancellation Note  Patient Details Name: [name] MRN: [id number] DOB: January 28, 1961   Cancelled Treatment:    Reason Eval/Treat Not Completed: Patient at procedure or test/ unavailable. Order received, chart reviewed. Pt being taken down for MRI. Will re-attempt OT evaluation at later time as pt is available.  Jeni Salles, MPH, MS, OTR/L ascom [phone] 09/23/16, 8:31 AM

## 2016-09-24 ENCOUNTER — Inpatient Hospital Stay (HOSPITAL_COMMUNITY)
Admit: 2016-09-24 | Discharge: 2016-09-24 | Disposition: A | Discharge status: Home / Self Care | Payer: BLUE CROSS/BLUE SHIELD | Attending: Internal Medicine | Admitting: Internal Medicine

## 2016-09-24 DIAGNOSIS — I6350 Cerebral infarction due to unspecified occlusion or stenosis of unspecified cerebral artery: Secondary | ICD-10-CM

## 2016-09-24 LAB — HIV ANTIBODY (ROUTINE TESTING W REFLEX): HIV SCREEN 4TH GENERATION: NONREACTIVE

## 2016-09-24 LAB — POCT I-STAT CREATININE: Creatinine, Ser: 0.9 mg/dL (ref 0.44–1.00)

## 2016-09-24 LAB — SEDIMENTATION RATE: Sed Rate: 14 mm/hr (ref 0–30)

## 2016-09-24 MED ORDER — IBUPROFEN 400 MG PO TABS: 400.0000 mg | ORAL_TABLET | Freq: Four times a day (QID) | ORAL | 0 refills | Status: DC | PRN

## 2016-09-24 MED ORDER — DIAZEPAM 5 MG PO TABS
5.0000 mg | ORAL_TABLET | Freq: Two times a day (BID) | ORAL | 0 refills | Status: DC
Start: 2016-09-24 — End: 2021-08-01

## 2016-09-24 MED ORDER — PANTOPRAZOLE SODIUM 40 MG PO TBEC
40.0000 mg | DELAYED_RELEASE_TABLET | Freq: Every day | ORAL | Status: DC
  Administered 2016-09-24 – 2016-09-26 (×3): 40 mg via ORAL
  Filled 2016-09-24 (×3): qty 1

## 2016-09-24 NOTE — Clinical Social Work Placement (Signed)
   CLINICAL SOCIAL WORK PLACEMENT  NOTE  Date:  09/24/2016  Patient Details  Name: RAPHAEL ESPE MRN: [id number] Date of Birth: 1960-04-24  Clinical Social Work is seeking post-discharge placement for this patient at the [place] level of care (*CSW will initial, date and re-position this form in  chart as items are completed):  Yes   Patient/family provided with [place] Work Department's list of facilities offering this level of care within the geographic area requested by the patient (or if unable, by the patient's family).  Yes   Patient/family informed of their freedom to choose among providers that offer the needed level of care, that participate in Medicare, Medicaid or managed care program needed by the patient, have an available bed and are willing to accept the patient.  Yes   Patient/family informed of [facility]'s ownership interest in [facility] and [facility] as well as of the fact that they are under no obligation to receive care at these facilities.  PASRR submitted to EDS on 09/24/16     PASRR number received on       Existing PASRR number confirmed on       FL2 transmitted to all facilities in geographic area requested by pt/family on 09/24/16     FL2 transmitted to all facilities within larger geographic area on       Patient informed that his/her managed care company has contracts with or will negotiate with certain facilities, including the following:            Patient/family informed of bed offers received.  Patient chooses bed at       Physician recommends and patient chooses bed at      Patient to be transferred to   on  .  Patient to be transferred to facility by       Patient family notified on   of transfer.  Name of family member notified:        PHYSICIAN       Additional Comment:    _______________________________________________ Darden Dates, LCSW 09/24/2016, 11:41 AM

## 2016-09-24 NOTE — Progress Notes (Signed)
  Speech Language Pathology Treatment: Dysphagia  Patient Details Name: [name] MRN: [id number] DOB: [birth year] Today's Date: 09/24/2016 Time: [id number] SLP Time Calculation (min) (ACUTE ONLY): 45 min  Assessment / Plan / Recommendation Clinical Impression  Pt seen today for upgrade of diet consistency. She appears much improved and stated she felt so as well. She indicated only minimal soreness of her mandible area but felt she was "ready for regular foods now". Noted Psychiatry note/assessment.  Pt consumed po trials of thin liquids and soft solid foods (grilled cheese sandwich) w/ no overt s/s of aspiration noted; no gross, overt oral phase deficits noted. Pt took small bites chewing somewhat carefully and slowly but as she continued to feed herself her meal, oral phase bolus management appeared wfl w/ timing and ease of mastication w/ boluses. Pt stated no complaints of discomfort indicating need for modified diet consistency; no noted oropharyngeal phase dysphagia was present to warrant such. Education given to pt on general aspiration precautions, food preparation and consistency, as well as REFLUX precautions - pt has a significant h/o Esophageal issues per chart notes.  Recommend upgrade of diet consistency w/ general precautions; ST services will be available for any further education as needed while admitted. Pt appeared satisfied w/ her progress and ability to tolerate a regular diet now. NSG updated.    HPI HPI: Pt is a 56 y.o. female with a known history of  CAD status post RCA stent in December 2017, COPD not on home oxygen, Depression and Anxiety, hypertension presents to hospital secondary to sudden onset of headache and left-sided weakness. Pt has chronic angina since her stent in December and recent Myoview as outpatient was negative . Being admitted for CVA. Per MD note, Acute left weakness and dysarthria - not CVA; MRI negative. Seen by neurology and suggested a psych consult.  Symptoms improved  after valium. Per Psychiatry note, patient probably have his somatic symptom disorder or what used to be called somatization disorder. It is important for the team to recognize that this is NOT malingering. I don't think the patient has any consciousness of feigning any of her symptoms. Patient was reassured that no stroke has been found. She agrees that her symptoms are already getting better. There is no specific treatment for this. Continue the modest dose of diazepam she always takes. Pt indicated this morning to SLP that her "jaw felt better" and that she felt she was "ready for solid foods". NSG aware of this as well.       SLP Plan  Continue with current plan of care       Recommendations  Diet recommendations: Dysphagia 3 (mechanical soft);Regular;Thin liquid Liquids provided via: Cup;Straw Medication Administration: Whole meds with puree (as needed) Supervision: Patient able to self feed;Intermittent supervision to cue for compensatory strategies (as needed) Compensations: Slow rate;Small sips/bites;Follow solids with liquid Postural Changes and/or Swallow Maneuvers: Seated upright 90 degrees;Upright 30-60 min after meal (REFLUX precautions)                General recommendations:  (n/a) Oral Care Recommendations: Oral care BID;Patient independent with oral care;Staff/trained caregiver to provide oral care Follow up Recommendations: None SLP Visit Diagnosis: Dysphagia, oral phase (R13.11) (improved) Plan: Continue with current plan of care       [place], [place], CCC-SLP Watson,Katherine 09/24/2016, 5:09 PM

## 2016-09-24 NOTE — Progress Notes (Signed)
*  PRELIMINARY RESULTS* Echocardiogram 2D Echocardiogram has been performed.  [name] 09/24/2016, 7:56 AM

## 2016-09-24 NOTE — Clinical Social Work Note (Signed)
Clinical Social Work Assessment  Patient Details  Name: [name] MRN: [id number] Date of Birth: [birth year]  Date of referral:  09/24/16               Reason for consult:  Facility Placement, Discharge Planning                Permission sought to share information with:  Family Supports Permission granted to share information::  Yes, Verbal Permission Granted  Name::     [name]  Relationship::  son  Contact Information:  [phone]  Housing/Transportation Living arrangements for the past 2 months:  [place] of Information:  Patient Patient Interpreter Needed:  None Criminal Activity/Legal Involvement Pertinent to Current Situation/Hospitalization:  No - Comment as needed Significant Relationships:  Adult Children, Parents Lives with:  Self Do you feel safe going back to the place where you live?  No (Pt is need of a higher level of care at this time.) Need for family participation in patient care:  No (Coment)  Care giving concerns:  Pt shared that she is she had to return home, she could stay with her mother.   Social Worker assessment / plan:  CSW met with pt to address consult for New SNF. CSW introduced herself and explained role of social work. CSW also explained the process of discharging to SNF as recommended by PT. Pt has a managed care (BCBS) and the plan would require a prior auth for SNF placement.   Pt works from home. Pt stated that she has two children (a son and daughter, who live in [place] and [place] respectively) and a mother who lives in [place]. Pt shared that she had a ha agreeable for short term rehab. CSW intaited SNF search and will follow up with bed offers. PASARR is pending. MD updated. CSW will continue to follow.   Employment status:  Therapist, music:  Managed Care PT Recommendations:  Benjamin Perez / Referral to community resources:  [place]  Patient/Family's  Response to care:  Pt was appreciative of CSW support.  Patient/Family's Understanding of and Emotional Response to Diagnosis, Current Treatment, and Prognosis:  Pt understands that she would benefit from short term rehab.   Emotional Assessment Appearance:  Appears stated age Attitude/Demeanor/Rapport:   (Appropriate) Affect (typically observed):  Accepting, Adaptable, Pleasant Orientation:  Oriented to Self, Oriented to Place, Oriented to  Time, Oriented to Situation Alcohol / Substance use:  Not Applicable Psych involvement (Current and /or in the community):  No (Comment)  Discharge Needs  Concerns to be addressed:  Adjustment to Illness Readmission within the last 30 days:  No Current discharge risk:  Chronically ill Barriers to Discharge:  Continued Medical Work up   [employer], LCSW 09/24/2016, 11:43 AM

## 2016-09-24 NOTE — Discharge Instructions (Addendum)
Heart healthy diet - Dysphagia 3 diet  Activity with assistance
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
## 2016-09-24 NOTE — NC FL2 (Signed)
[place] LEVEL OF CARE SCREENING TOOL     IDENTIFICATION  Patient Name: [name] Birthdate: [birth year] Sex: female Admission Date (Current Location): 09/22/2016  [facility] and [place] Number:  Engineering geologist and Address:  [facility], [address] [place], [place] [address]      Provider Number: [id number]  Attending Physician Name and Address:  Hillary Bow, MD  Relative Name and Phone Number:       Current Level of Care: Hospital Recommended Level of Care: [place] Prior Approval Number:    Date Approved/Denied:   PASRR Number:    Discharge Plan: SNF    Current Diagnoses: Patient Active Problem List   Diagnosis Date Noted  . Somatization disorder 09/23/2016  . CVA (cerebral vascular accident) ([place]) 09/22/2016  . Intractable cyclical vomiting with nausea   . Intractable nausea and vomiting 04/12/2016  . Protein-calorie malnutrition, severe 04/11/2016  . Gastroenteritis 04/09/2016  . 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 Mild dysplasia of cervix 07/05/2013  . Colon cancer screening 06/02/2013  . Granular cell tumor OF THE ESOPHAGUS 06/02/2013  . Gastroparesis 09/03/2011  . Bloating 07/16/2011  . Gastritis 02/11/2011  . Ventral hernia 02/11/2011  . Nausea 01/18/2011  . COPD (chronic obstructive pulmonary disease) ([place]) 01/18/2011  . Anxiety and depression 01/18/2011  . HYPERTENSION 01/22/2010    Orientation RESPIRATION BLADDER Height & Weight     Self, Time, Situation, Place  Normal Continent Weight: 160 lb (72.6 kg) Height:  5\' 6"  (167.6 cm)  BEHAVIORAL SYMPTOMS/MOOD NEUROLOGICAL BOWEL NUTRITION STATUS      Continent Diet (DYS 3, Thin Liquids, Extra Gravy on Chopped Meats)  AMBULATORY STATUS COMMUNICATION OF NEEDS Skin   Limited Assist Verbally Normal                       Personal Care Assistance Level of Assistance  Bathing, Feeding, Dressing Bathing Assistance: Limited  assistance Feeding assistance: Independent Dressing Assistance: Limited assistance     Functional Limitations Info  Sight, Hearing, Speech Sight Info: Adequate Hearing Info: Adequate Speech Info: Adequate    SPECIAL CARE FACTORS FREQUENCY  PT (By licensed PT), OT (By licensed OT)     PT Frequency: 5 OT Frequency: 5            Contractures Contractures Info: Not present    Additional Factors Info  Code Status, Allergies, Psychotropic Code Status Info: Full Code Allergies Info:  Lidocaine, Neurontin Gabapentin, Pregabalin, Shellfish Allergy Psychotropic Info: Medications:  Valium         Current Medications (09/24/2016):  This is the current hospital active medication list Current Facility-Administered Medications  Medication Dose Route Frequency Provider Last Rate Last Dose  . 0.9 %  sodium chloride infusion   Intravenous Continuous Gladstone Lighter, MD 60 mL/hr at 09/24/16 0850    . acetaminophen (TYLENOL) tablet 650 mg  650 mg Oral Q6H PRN Gladstone Lighter, MD       Or  . acetaminophen (TYLENOL) suppository 650 mg  650 mg Rectal Q6H PRN Gladstone Lighter, MD      . aspirin tablet 325 mg  325 mg Oral Daily Hillary Bow, MD   325 mg at 09/24/16 0846  . atorvastatin (LIPITOR) tablet 80 mg  80 mg Oral q1800 Hillary Bow, MD   80 mg at 09/23/16 1827  . clopidogrel (PLAVIX) tablet 75 mg  75 mg Oral Daily Hillary Bow, MD   75 mg at 09/24/16 0846  .  diazepam (VALIUM) tablet 5 mg  5 mg Oral Q12H Hillary Bow, MD   5 mg at 09/24/16 0846  . enoxaparin (LOVENOX) injection 40 mg  40 mg Subcutaneous Q24H Gladstone Lighter, MD   40 mg at 09/23/16 2027  . ibuprofen (ADVIL,MOTRIN) tablet 400 mg  400 mg Oral Q6H PRN Hillary Bow, MD   400 mg at 09/23/16 2027  . iopamidol (ISOVUE-370) 76 % injection 125 mL  125 mL Intravenous Once PRN Merlyn Lot, MD      . ipratropium-albuterol (DUONEB) 0.5-2.5 (3) MG/3ML nebulizer solution 3 mL  3 mL Nebulization Q4H PRN Lance Coon, MD      . ketorolac (TORADOL) 15 MG/ML injection 15 mg  15 mg Intravenous Q6H PRN Hillary Bow, MD   15 mg at 09/24/16 0553  . meclizine (ANTIVERT) tablet 12.5 mg  12.5 mg Oral TID PRN Hillary Bow, MD      . mometasone-formoterol [facility]) 200-5 MCG/ACT inhaler 2 puff  2 puff Inhalation BID Gladstone Lighter, MD   2 puff at 09/24/16 0846  . ondansetron (ZOFRAN) tablet 4 mg  4 mg Oral Q6H PRN Gladstone Lighter, MD       Or  . ondansetron [facility]) injection 4 mg  4 mg Intravenous Q6H PRN Gladstone Lighter, MD   4 mg at 09/23/16 0750     Discharge Medications: Please see discharge summary for a list of discharge medications.  Relevant Imaging Results:  Relevant Lab Results:   Additional Information SSN:  [id number]  Darden Dates, LCSW

## 2016-09-24 NOTE — Progress Notes (Signed)
Physical Therapy Treatment Patient Details Name: [name] MRN: [id number] DOB: [birth year] Today's Date: 09/24/2016    History of Present Illness presented to ER with acute onset of headache, L-sided weakness, reported loss of consciousness; admitted for TIA/CVA work up.  All imaging (head CT, MRI, L hip xray) negative for acute pathology.    PT Comments    Patient with marked improvement in voice control and overall expressive communication; now speaking fluently and normally.  Able to actively mobilize L UE/LE approx 50-75% normal range with decreased reports of pain.  L UE/LE strength with isolated assessment generally inconsistent with abilities noted during functional activities.    Completes sit/stand, basic transfers and gait (75') with RW, min assist; slow and guarded, but able to maintain L UE grip, UE placement and L UE stance/LE control without significant difficulty compared to previous date.  Anticipate continued functional improvement with continued therapy services and adherence to issued HEP.   Follow Up Recommendations  SNF     Equipment Recommendations       Recommendations for Other Services       Precautions / Restrictions Precautions Precautions: Fall Restrictions Weight Bearing Restrictions: No    Mobility  Bed Mobility Overal bed mobility: Needs Assistance Bed Mobility: Supine to Sit     Supine to sit: Min assist        Transfers Overall transfer level: Needs assistance Equipment used: Rolling walker (2 wheeled) Transfers: Sit to/from Stand Sit to Stand: Min assist         General transfer comment: cuing for hand placement and active use of L UE with movement transition (tends to hold in guarded position across abdomen)  Ambulation/Gait Ambulation/Gait assistance: Min assist Ambulation Distance (Feet): 75 Feet Assistive device: Rolling walker (2 wheeled)       General Gait Details: 3-point step to gait pattern progressing to  partially reciprocal stepping pattern with min cuing from therapist.  Heavy WBing in bilat UEs (suggesting greater strength than demonstrated with isolated MMT).  Very slow and guarded, decreased step height/length bilat.   Stairs            Wheelchair Mobility    Modified Rankin (Stroke Patients Only)       Balance Overall balance assessment: Needs assistance Sitting-balance support: No upper extremity supported;Feet supported Sitting balance-Leahy Scale: Good     Standing balance support: Bilateral upper extremity supported Standing balance-Leahy Scale: Fair                              Cognition Arousal/Alertness: Awake/alert Behavior During Therapy: WFL for tasks assessed/performed Overall Cognitive Status: Within Functional Limits for tasks assessed                                 General Comments: speaking fluently in full sentences, normal phonation and voice control      Exercises Other Exercises Other Exercises: Sit/stand with RW x3, min assist-cuing for hand placement, increased use of L UE/LE Other Exercises: Progressed to include lateral weight shifting, forward/retro stepping with RW, min assist.  Tolerating WBing (body weight) in L LE without buckling. Other Exercises: Reviewed bilat UE/LE therex for use as HEP to further increase strength/muscular control and endurance--act assist shoulder flex/ext, ankle pumps, heel slides, hip abduct/adduct and LAQs. Patient able to return demo of 5-7 reps of each therex with min cuing for technique.  General Comments        Pertinent Vitals/Pain Pain Assessment: Faces Faces Pain Scale: Hurts a little bit Pain Location: L hip Pain Descriptors / Indicators: Aching;Guarding Pain Intervention(s): Limited activity within patient's tolerance;Monitored during session;Repositioned    Home Living                      Prior Function            PT Goals (current goals can now be  found in the care plan section) Acute Rehab PT Goals Patient Stated Goal: have less pain PT Goal Formulation: With patient Time For Goal Achievement: 09/24/16 Potential to Achieve Goals: Fair Progress towards PT goals: Progressing toward goals    Frequency    Min 2X/week      PT Plan Current plan remains appropriate    Co-evaluation              AM-PAC PT "6 Clicks" Daily Activity  Outcome Measure  Difficulty turning over in bed (including adjusting bedclothes, sheets and blankets)?: Total Difficulty moving from lying on back to sitting on the side of the bed? : Total Difficulty sitting down on and standing up from a chair with arms (e.g., wheelchair, bedside commode, etc,.)?: Total Help needed moving to and from a bed to chair (including a wheelchair)?: A Little Help needed walking in hospital room?: A Little Help needed climbing 3-5 steps with a railing? : A Lot 6 Click Score: 11    End of Session Equipment Utilized During Treatment: Gait belt Activity Tolerance: Patient tolerated treatment well Patient left: in chair;with call bell/phone within reach;with chair alarm set Nurse Communication: Mobility status PT Visit Diagnosis: History of falling (Z91.81);Muscle weakness (generalized) (M62.81)     Time: 0300-9233 PT Time Calculation (min) (ACUTE ONLY): 31 min  Charges:  $Gait Training: 8-22 mins $Therapeutic Exercise: 8-22 mins                    G Codes:       Mancel Lardizabal H. Owens Shark, PT, DPT, NCS 09/24/16, 4:31 PM [phone]

## 2016-09-25 LAB — HEMOGLOBIN A1C
Hgb A1c MFr Bld: 5.5 % (ref 4.8–5.6)
MEAN PLASMA GLUCOSE: 111 mg/dL

## 2016-09-25 MED ORDER — SENNA 8.6 MG PO TABS
1.0000 | ORAL_TABLET | Freq: Every day | ORAL | Status: DC
  Administered 2016-09-25 – 2016-09-26 (×2): 8.6 mg via ORAL
  Filled 2016-09-25 (×2): qty 1

## 2016-09-25 MED ORDER — POLYETHYLENE GLYCOL 3350 17 G PO PACK
17.0000 g | PACK | Freq: Every day | ORAL | Status: DC
  Administered 2016-09-25: 12:00:00 17 g via ORAL
  Filled 2016-09-25 (×2): qty 1

## 2016-09-25 NOTE — Clinical Social Work Note (Signed)
CSW met with pt and provided bed offers. Pt chose [facility]. CSW notified facility and sent clinicals to facility to start [place]. CSW will continue to follow.   Darden Dates, MSW, LCSW  Clinical Social Worker  [phone]

## 2016-09-25 NOTE — Progress Notes (Signed)
[facility] [place] NAME: [name]    MR#:  [id number]  DATE OF BIRTH:  11-15-1960  SUBJECTIVE:  CHIEF COMPLAINT:   Chief Complaint  Patient presents with  . Loss of Consciousness    Speech better. Swallowing improved. Able to move left side but still has significant weakness. Anxious  Still has left hip and shoulder pain  REVIEW OF SYSTEMS:    Review of Systems  Constitutional: Positive for malaise/fatigue. Negative for chills and fever.  HENT: Negative for sore throat.   Eyes: Negative for blurred vision, double vision and pain.  Respiratory: Negative for cough, hemoptysis, shortness of breath and wheezing.   Cardiovascular: Negative for chest pain, palpitations, orthopnea and leg swelling.  Gastrointestinal: Negative for abdominal pain, constipation, diarrhea, heartburn, nausea and vomiting.  Genitourinary: Negative for dysuria and hematuria.  Musculoskeletal: Positive for back pain, falls and joint pain.  Skin: Negative for rash.  Neurological: Positive for dizziness, speech change, focal weakness, weakness and headaches. Negative for sensory change.  Endo/Heme/Allergies: Does not bruise/bleed easily.  Psychiatric/Behavioral: Negative for depression. The patient is not nervous/anxious.     DRUG ALLERGIES:   Allergies  Allergen Reactions  . Lidocaine   . Neurontin [Gabapentin]     Dizziness, Confusion   . Pregabalin Other (See Comments)    'bad reaction' hallucinations and acting crazy after taking Lyrica  . Shellfish Allergy Nausea And Vomiting    VITALS:  Blood pressure 138/77, pulse 66, temperature 97.7 F (36.5 C), temperature source Oral, resp. rate 19, height 5\' 6"  (1.676 m), weight 72.6 kg (160 lb), last menstrual period 02/03/2011, SpO2 100 %.  PHYSICAL EXAMINATION:   Physical Exam  GENERAL:  56 y.o.-year-old patient lying in the bed with no acute distress.  EYES: Pupils equal, round, reactive to light  and accommodation. No scleral icterus. Extraocular muscles intact.  HEENT: Head atraumatic, normocephalic. Oropharynx and nasopharynx clear.  NECK:  Supple, no jugular venous distention. No thyroid enlargement, no tenderness.  LUNGS: Normal breath sounds bilaterally, no wheezing, rales, rhonchi. No use of accessory muscles of respiration.  CARDIOVASCULAR: S1, S2 normal. No murmurs, rubs, or gallops.  ABDOMEN: Soft, nontender, nondistended. Bowel sounds present. No organomegaly or mass.  EXTREMITIES: No cyanosis, clubbing or edema b/l.    NEUROLOGIC: Cranial nerves II through XII are intact.  Left weakness.  PSYCHIATRIC: The patient is alert and oriented x 3.  SKIN: No obvious rash, lesion, or ulcer.  Left hip and shoulder tenderness  LABORATORY PANEL:   CBC  Recent Labs Lab 09/23/16 0426  WBC 8.1  HGB 13.3  HCT 38.9  PLT 245   ------------------------------------------------------------------------------------------------------------------ Chemistries   Recent Labs Lab 09/22/16 1605  09/23/16 0426  NA 139  --  140  K 4.0  --  4.1  CL 103  --  105  CO2 24  --  28  GLUCOSE 111*  --  107*  BUN 18  --  16  CREATININE 0.93  < > 0.92  CALCIUM 9.6  --  9.0  AST 23  --   --   ALT 24  --   --   ALKPHOS 102  --   --   BILITOT 0.4  --   --   < > = values in this interval not displayed. ------------------------------------------------------------------------------------------------------------------  Cardiac Enzymes  Recent Labs Lab 09/22/16 1605  TROPONINI <0.03   ------------------------------------------------------------------------------------------------------------------  RADIOLOGY:  Dg Shoulder Left  Result Date: 09/23/2016 CLINICAL DATA:  56 year old female fell on left side. Initial encounter. EXAM: LEFT SHOULDER - 2+ VIEW COMPARISON:  09/22/2016 chest CT. FINDINGS: There is no evidence of fracture or dislocation. There is no evidence of arthropathy or other  focal bone abnormality. Soft tissues are unremarkable. IMPRESSION: Negative. Electronically Signed   By: Genia Del M.D.   On: 09/23/2016 18:32   Dg Hip Unilat With Pelvis 2-3 Views Left  Result Date: 09/23/2016 CLINICAL DATA:  Fall EXAM: DG HIP (WITH OR WITHOUT PELVIS) 2-3V LEFT COMPARISON:  None. FINDINGS: No acute fracture. No dislocation.  Unremarkable soft tissues. IMPRESSION: No acute bony pathology. Electronically Signed   By: Marybelle Killings M.D.   On: 09/23/2016 18:11     ASSESSMENT AND PLAN:   * Acute left weakness and dysarthria - Not CVA MRI negative for CVA Refused tPA in ED. Now On ASA, Plavix, Statin Seen by neurology and suggested a psych consult. Discussed with Dr. Weber Cooks. Advised to continue Valium. Symptoms are improving after valium Etiology unclear but could be psychogenic.  * h/o CAD On home meds  * Anxiety  * COPD   left hip and shoulder xrays Showed no fractures or dislocation  Explained to patient she will need PEG tube if no improvement of symptoms with dysphagia  All the records are reviewed and case discussed with Care Management/Social Workerr. Management plans discussed with the patient, family and they are in agreement.  CODE STATUS: FULL CODE  DVT Prophylaxis: SCDs  TOTAL TIME TAKING CARE OF THIS PATIENT: 30 minutes.   POSSIBLE D/C IN 1-2 DAYS, DEPENDING ON CLINICAL CONDITION.  Hillary Bow R M.D on 09/25/2016 at 10:43 AM  Between 7am to 6pm - Pager - [phone]  After 6pm go to [email or web address] - password [email or web address] [facility]  Office  [phone]  CC: Primary care physician; Sharilyn Sites, MD  Note: This dictation was prepared with Dragon dictation along with smaller phrase technology. Any transcriptional errors that result from this process are unintentional.

## 2016-09-25 NOTE — Progress Notes (Signed)
Occupational Therapy Treatment Patient Details Name: [name] MRN: [id number] DOB: [birth year] Today's Date: 09/25/2016    History of present illness presented to ER with acute onset of headache, L-sided weakness, reported loss of consciousness; admitted for TIA/CVA work up.  All imaging (head CT, MRI, L hip xray) negative for acute pathology.   OT comments  Pt seen for OT treatment session this date, on the phone attempting to set up a new primary care appointment upon OT's entry into room. Pt educated in pursed lip breathing, energy conservation strategies, and cognitive behavioral pain coping strategies to support functional participation and independence with ADL/IADL, better manage pain, and support anxiety/SOB/poor activity tolerance. Pt thankful for education provided stating "I could use this, because I'm dealing with a lot and have been really anxious about everything and just asked for a breathing treatment." Continue to progress pt with OT goals.    Follow Up Recommendations  SNF    Equipment Recommendations  3 in 1 bedside commode    Recommendations for Other Services      Precautions / Restrictions Precautions Precautions: Fall Restrictions Weight Bearing Restrictions: No       Mobility Bed Mobility Overal bed mobility: Needs Assistance             General bed mobility comments: deferred per pt request after seated bathing with nursing and feeling SOB/about to get breathing treatment  Transfers                 General transfer comment: deferred per pt request after seated bathing with nursing and feeling SOB/about to get breathing treatment    Balance                                           ADL either performed or assessed with clinical judgement   ADL Overall ADL's : Needs assistance/impaired Eating/Feeding: Set up;Sitting   Grooming: Bed level;Set up   Upper Body Bathing: Set up;Sitting Upper Body Bathing Details  (indicate cue type and reason): pt eager to report she was able to perform seated bedside bath with set up of items and  Lower Body Bathing: Sitting/lateral leans;Set up   Upper Body Dressing : Minimal assistance;Sitting;Moderate assistance   Lower Body Dressing: Minimal assistance;Moderate assistance;Sitting/lateral leans                 General ADL Comments: pt generally min-mod assist for ADL, improvement from initial evaluation     Vision Baseline Vision/History: No visual deficits Wears Glasses: Reading only Patient Visual Report: No change from baseline Vision Assessment?: No apparent visual deficits   Perception     Praxis      Cognition Arousal/Alertness: Awake/alert Behavior During Therapy: WFL for tasks assessed/performed Overall Cognitive Status: Within Functional Limits for tasks assessed                                 General Comments: speaking fluently in full sentences, normal phonation and voice control; on the phone trying to set up new primary care doctor appointment at start of session        Exercises Other Exercises Other Exercises: pt educated in energy conservation strategies with handout provided and pt verbalized understanding Other Exercises: pt educated in pursed lip breathing to minimize SOB/maximize control over breathing/SOB and pt able to  demonstrate technique with verbal cues to use Other Exercises: pt educated in cognitive behavioral pain coping strategies to support pain mgt and pt verbalized understanding stating "I think that will be really helpful"   Shoulder Instructions       General Comments      Pertinent Vitals/ Pain       Pain Assessment: 0-10 Pain Score: 7  Pain Location: L hip Pain Descriptors / Indicators: Aching;Grimacing;Guarding Pain Intervention(s): Limited activity within patient's tolerance;Monitored during session;Premedicated before session  Home Living                                           Prior Functioning/Environment              Frequency  Min 2X/week        Progress Toward Goals  OT Goals(current goals can now be found in the care plan section)  Progress towards OT goals: Progressing toward goals  Acute Rehab OT Goals Patient Stated Goal: have less pain OT Goal Formulation: With patient Time For Goal Achievement: 10/07/16 Potential to Achieve Goals: Good  Plan Discharge plan remains appropriate;Frequency remains appropriate    Co-evaluation                 AM-PAC PT "6 Clicks" Daily Activity     Outcome Measure   Help from another person eating meals?: None Help from another person taking care of personal grooming?: None Help from another person toileting, which includes using toliet, bedpan, or urinal?: A Lot Help from another person bathing (including washing, rinsing, drying)?: A Little Help from another person to put on and taking off regular upper body clothing?: A Lot Help from another person to put on and taking off regular lower body clothing?: A Lot 6 Click Score: 17    End of Session    OT Visit Diagnosis: Other abnormalities of gait and mobility (R26.89);History of falling (Z91.81);Muscle weakness (generalized) (M62.81);Pain Pain - Right/Left: Left Pain - part of body: Hip   Activity Tolerance Patient tolerated treatment well   Patient Left in bed;with call bell/phone within reach;with bed alarm set   Nurse Communication          Time: 0814-4818 OT Time Calculation (min): 19 min  Charges: OT General Charges $OT Visit: 1 Procedure OT Treatments $Therapeutic Activity: 8-22 mins  Jeni Salles, MPH, MS, OTR/L ascom [phone] 09/25/16, 1:24 PM

## 2016-09-25 NOTE — Clinical Social Work Placement (Signed)
   CLINICAL SOCIAL WORK PLACEMENT  NOTE  Date:  09/25/2016  Patient Details  Name: [name] MRN: [id number] Date of Birth: March 22, 1961  Clinical Social Work is seeking post-discharge placement for this patient at the [place] level of care (*CSW will initial, date and re-position this form in  chart as items are completed):  Yes   Patient/family provided with [facility] Work Department's list of facilities offering this level of care within the geographic area requested by the patient (or if unable, by the patient's family).  Yes   Patient/family informed of their freedom to choose among providers that offer the needed level of care, that participate in Medicare, Medicaid or managed care program needed by the patient, have an available bed and are willing to accept the patient.  Yes   Patient/family informed of [facility]'s ownership interest in [facility] and [facility], as well as of the fact that they are under no obligation to receive care at these facilities.  PASRR submitted to EDS on 09/24/16     PASRR number received on 09/25/16     Existing PASRR number confirmed on       FL2 transmitted to all facilities in geographic area requested by pt/family on 09/24/16     FL2 transmitted to all facilities within larger geographic area on       Patient informed that his/her managed care company has contracts with or will negotiate with certain facilities, including the following:        Yes   Patient/family informed of bed offers received.  Patient chooses bed at [facility]     Physician recommends and patient chooses bed at      Patient to be transferred to   on  .  Patient to be transferred to facility by       Patient family notified on   of transfer.  Name of family member notified:        PHYSICIAN       Additional Comment:    _______________________________________________ Darden Dates, LCSW 09/25/2016,  2:08 PM

## 2016-09-25 NOTE — Progress Notes (Signed)
[place] at [place] NAME: [name]    MR#:  [id number]  DATE OF BIRTH:  [birth year]  SUBJECTIVE:  CHIEF COMPLAINT:   Chief Complaint  Patient presents with  . Loss of Consciousness    Speech better. Swallowing improved. Able to move left side but still has significant weakness. Anxious  Still has left hip and shoulder pain  REVIEW OF SYSTEMS:    Review of Systems  Constitutional: Positive for malaise/fatigue. Negative for chills and fever.  HENT: Negative for sore throat.   Eyes: Negative for blurred vision, double vision and pain.  Respiratory: Negative for cough, hemoptysis, shortness of breath and wheezing.   Cardiovascular: Negative for chest pain, palpitations, orthopnea and leg swelling.  Gastrointestinal: Negative for abdominal pain, constipation, diarrhea, heartburn, nausea and vomiting.  Genitourinary: Negative for dysuria and hematuria.  Musculoskeletal: Positive for back pain, falls and joint pain.  Skin: Negative for rash.  Neurological: Positive for dizziness, speech change, focal weakness, weakness and headaches. Negative for sensory change.  Endo/Heme/Allergies: Does not bruise/bleed easily.  Psychiatric/Behavioral: Negative for depression. The patient is not nervous/anxious.     DRUG ALLERGIES:   Allergies  Allergen Reactions  . Lidocaine   . Neurontin [Gabapentin]     Dizziness, Confusion   . Pregabalin Other (See Comments)    'bad reaction' hallucinations and acting crazy after taking Lyrica  . Shellfish Allergy Nausea And Vomiting    VITALS:  Blood pressure 138/77, pulse 66, temperature 97.7 F (36.5 C), temperature source Oral, resp. rate 19, height 5\' 6"  (1.676 m), weight 72.6 kg (160 lb), last menstrual period 02/03/2011, SpO2 100 %.  PHYSICAL EXAMINATION:   Physical Exam  GENERAL:  56 y.o.-year-old patient lying in the bed with no acute distress.  EYES: Pupils equal, round, reactive to light  and accommodation. No scleral icterus. Extraocular muscles intact.  HEENT: Head atraumatic, normocephalic. Oropharynx and nasopharynx clear.  NECK:  Supple, no jugular venous distention. No thyroid enlargement, no tenderness.  LUNGS: Normal breath sounds bilaterally, no wheezing, rales, rhonchi. No use of accessory muscles of respiration.  CARDIOVASCULAR: S1, S2 normal. No murmurs, rubs, or gallops.  ABDOMEN: Soft, nontender, nondistended. Bowel sounds present. No organomegaly or mass.  EXTREMITIES: No cyanosis, clubbing or edema b/l.    NEUROLOGIC: Cranial nerves II through XII are intact.  Left weakness.  PSYCHIATRIC: The patient is alert and oriented x 3.  SKIN: No obvious rash, lesion, or ulcer.  Left hip and shoulder tenderness  LABORATORY PANEL:   CBC  Recent Labs Lab 09/23/16 0426  WBC 8.1  HGB 13.3  HCT 38.9  PLT 245   ------------------------------------------------------------------------------------------------------------------ Chemistries   Recent Labs Lab 09/22/16 1605  09/23/16 0426  NA 139  --  140  K 4.0  --  4.1  CL 103  --  105  CO2 24  --  28  GLUCOSE 111*  --  107*  BUN 18  --  16  CREATININE 0.93  < > 0.92  CALCIUM 9.6  --  9.0  AST 23  --   --   ALT 24  --   --   ALKPHOS 102  --   --   BILITOT 0.4  --   --   < > = values in this interval not displayed. ------------------------------------------------------------------------------------------------------------------  Cardiac Enzymes  Recent Labs Lab 09/22/16 1605  TROPONINI <0.03   ------------------------------------------------------------------------------------------------------------------  RADIOLOGY:  Dg Shoulder Left  Result Date: 09/23/2016 CLINICAL DATA:  56 year old female fell on left side. Initial encounter. EXAM: LEFT SHOULDER - 2+ VIEW COMPARISON:  09/22/2016 chest CT. FINDINGS: There is no evidence of fracture or dislocation. There is no evidence of arthropathy or other  focal bone abnormality. Soft tissues are unremarkable. IMPRESSION: Negative. Electronically Signed   By: Genia Del M.D.   On: 09/23/2016 18:32   Dg Hip Unilat With Pelvis 2-3 Views Left  Result Date: 09/23/2016 CLINICAL DATA:  Fall EXAM: DG HIP (WITH OR WITHOUT PELVIS) 2-3V LEFT COMPARISON:  None. FINDINGS: No acute fracture. No dislocation.  Unremarkable soft tissues. IMPRESSION: No acute bony pathology. Electronically Signed   By: Marybelle Killings M.D.   On: 09/23/2016 18:11     ASSESSMENT AND PLAN:   * Acute left weakness and dysarthria - Not CVA MRI negative for CVA. Carotid Dopplers and echocardiogram showed no significant findings Refused tPA in ED. Now On ASA, Plavix, Statin Psychiatry consulted and appreciate input Continue  Valium as needed Etiology unclear but could be psychogenic.  * h/o CAD On home meds  * Anxiety  * COPD nebulizers when necessary   left hip and shoulder xrays Showed no fractures or dislocation   All the records are reviewed and case discussed with Care Management/Social Workerr. Management plans discussed with the patient, family and they are in agreement.  CODE STATUS: FULL CODE  DVT Prophylaxis: SCDs  TOTAL TIME TAKING CARE OF THIS PATIENT: 30 minutes.   POSSIBLE D/C IN 1-2 DAYS, DEPENDING ON CLINICAL CONDITION.  Hillary Bow R M.D on 09/25/2016 at 10:45 AM  Between 7am to 6pm - Pager - [phone]  After 6pm go to [email or web address] - password [email or web address] [facility]  Office  [phone]  CC: Primary care physician; Sharilyn Sites, MD  Note: This dictation was prepared with Dragon dictation along with smaller phrase technology. Any transcriptional errors that result from this process are unintentional.

## 2016-09-26 MED ORDER — BISACODYL 10 MG RE SUPP
10.0000 mg | Freq: Every day | RECTAL | Status: DC | PRN
  Administered 2016-09-26: 12:00:00 10 mg via RECTAL
  Filled 2016-09-26: qty 1

## 2016-09-26 MED ORDER — MAGNESIUM CITRATE PO SOLN
1.0000 | Freq: Once | ORAL | Status: AC
  Administered 2016-09-26: 10:00:00 1 via ORAL
  Filled 2016-09-26: qty 296

## 2016-09-26 NOTE — Clinical Social Work Note (Signed)
Pt is ready for discharge today and will go to [facility] as [employer] has given [place]. Pt is aware and agreeable to discharge plan. Pt notified her mother and son. Facility is ready to admit pt as they have received discharge information. RN will call report. [facility] will provide transportation. CSW is signing off as no further needs identified.   Darden Dates, MSW, LCSW  Clinical Social Worker  [phone]

## 2016-09-26 NOTE — Progress Notes (Signed)
Speech Therapy Note: reviewed chart notes; consulted NSG. Pt conversing appropriately w/ clear speech; appropriate ROM of jaw/oral movements. Pt and NSG denied any difficulty w/ swallowing now; pt stated she is chewing "better" and eating "ok".  Pt requested solid meats that she could cut. Diet consistency upgraded; general aspiration precautions discussed and recommended. Pt agreed.  No further skilled ST Services indicated at this time. NSG to reconsult if any change in status while admitted.     Orinda Kenner, [place], CCC-SLP

## 2016-09-26 NOTE — Progress Notes (Signed)
Pt is being discharged to [facility]. AVS given and explained to pt. Pt verbalized understanding. Meds reviewed with pt. Additional copy of AVS placed in the discharge packet. Report given to [facility], RN. Called EMS.

## 2016-10-29 DIAGNOSIS — Z8673 Personal history of transient ischemic attack (TIA), and cerebral infarction without residual deficits: Secondary | ICD-10-CM | POA: Insufficient documentation

## 2016-10-31 DIAGNOSIS — G5603 Carpal tunnel syndrome, bilateral upper limbs: Secondary | ICD-10-CM | POA: Insufficient documentation

## 2016-11-05 DIAGNOSIS — M542 Cervicalgia: Secondary | ICD-10-CM | POA: Insufficient documentation

## 2016-11-05 DIAGNOSIS — I779 Disorder of arteries and arterioles, unspecified: Secondary | ICD-10-CM | POA: Insufficient documentation

## 2016-11-07 ENCOUNTER — Telehealth: Payer: Self-pay | Admitting: Psychiatry

## 2016-11-07 ENCOUNTER — Other Ambulatory Visit: Payer: Self-pay | Admitting: Internal Medicine

## 2016-11-07 DIAGNOSIS — R29898 Other symptoms and signs involving the musculoskeletal system: Secondary | ICD-10-CM

## 2016-11-13 ENCOUNTER — Encounter: Payer: Self-pay | Admitting: Emergency Medicine

## 2016-11-13 ENCOUNTER — Ambulatory Visit
Admission: EM | Admit: 2016-11-13 | Discharge: 2016-11-13 | Disposition: A | Discharge status: Home / Self Care | Payer: BLUE CROSS/BLUE SHIELD | Attending: Emergency Medicine | Admitting: Emergency Medicine

## 2016-11-13 DIAGNOSIS — H9201 Otalgia, right ear: Secondary | ICD-10-CM | POA: Diagnosis not present

## 2016-11-13 DIAGNOSIS — T161XXA Foreign body in right ear, initial encounter: Secondary | ICD-10-CM

## 2016-11-13 DIAGNOSIS — H6123 Impacted cerumen, bilateral: Secondary | ICD-10-CM
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
## 2016-11-13 NOTE — ED Triage Notes (Signed)
Patient c/o right ear pain that started couple of weeks ago.

## 2016-11-20 ENCOUNTER — Ambulatory Visit: Payer: BLUE CROSS/BLUE SHIELD

## 2016-11-24 ENCOUNTER — Ambulatory Visit: Payer: BLUE CROSS/BLUE SHIELD

## 2016-11-27 ENCOUNTER — Ambulatory Visit
Admission: RE | Admit: 2016-11-27 | Discharge: 2016-11-27 | Disposition: A | Discharge status: Home / Self Care | Payer: BLUE CROSS/BLUE SHIELD | Source: Ambulatory Visit | Attending: Internal Medicine | Admitting: Internal Medicine

## 2016-11-27 DIAGNOSIS — M2578 Osteophyte, vertebrae: Secondary | ICD-10-CM | POA: Insufficient documentation

## 2016-11-27 DIAGNOSIS — M4802 Spinal stenosis, cervical region: Secondary | ICD-10-CM | POA: Insufficient documentation

## 2016-11-27 DIAGNOSIS — R29898 Other symptoms and signs involving the musculoskeletal system: Secondary | ICD-10-CM

## 2016-12-23 DIAGNOSIS — R531 Weakness: Secondary | ICD-10-CM | POA: Insufficient documentation

## 2017-02-23 DIAGNOSIS — G459 Transient cerebral ischemic attack, unspecified: Secondary | ICD-10-CM | POA: Insufficient documentation

## 2017-09-30 DIAGNOSIS — N183 Chronic kidney disease, stage 3 unspecified: Secondary | ICD-10-CM | POA: Insufficient documentation

## 2017-09-30 DIAGNOSIS — R739 Hyperglycemia, unspecified: Secondary | ICD-10-CM | POA: Insufficient documentation

## 2017-09-30 DIAGNOSIS — Z0000 Encounter for general adult medical examination without abnormal findings: Secondary | ICD-10-CM | POA: Insufficient documentation

## 2017-09-30 DIAGNOSIS — R7303 Prediabetes: Secondary | ICD-10-CM | POA: Insufficient documentation

## 2017-10-26 ENCOUNTER — Other Ambulatory Visit: Payer: Self-pay | Admitting: Orthopedic Surgery

## 2017-10-26 DIAGNOSIS — M5412 Radiculopathy, cervical region: Secondary | ICD-10-CM

## 2018-01-19 ENCOUNTER — Other Ambulatory Visit (HOSPITAL_COMMUNITY): Payer: Self-pay | Admitting: Internal Medicine

## 2018-01-19 DIAGNOSIS — Z1231 Encounter for screening mammogram for malignant neoplasm of breast: Secondary | ICD-10-CM

## 2018-01-20 ENCOUNTER — Encounter (HOSPITAL_COMMUNITY): Payer: Self-pay

## 2018-01-20 ENCOUNTER — Ambulatory Visit (HOSPITAL_COMMUNITY)
Admission: RE | Admit: 2018-01-20 | Discharge: 2018-01-20 | Disposition: A | Discharge status: Home / Self Care | Payer: BLUE CROSS/BLUE SHIELD | Source: Ambulatory Visit | Attending: Internal Medicine | Admitting: Internal Medicine

## 2018-01-20 DIAGNOSIS — Z1231 Encounter for screening mammogram for malignant neoplasm of breast: Secondary | ICD-10-CM | POA: Diagnosis not present

## 2019-02-24 ENCOUNTER — Other Ambulatory Visit
Admission: RE | Admit: 2019-02-24 | Discharge: 2019-02-24 | Disposition: A | Discharge status: Home / Self Care | Payer: PRIVATE HEALTH INSURANCE | Source: Ambulatory Visit | Attending: Internal Medicine | Admitting: Internal Medicine

## 2019-02-24 DIAGNOSIS — Z01818 Encounter for other preprocedural examination: Secondary | ICD-10-CM | POA: Diagnosis not present

## 2019-02-24 LAB — FIBRIN DERIVATIVES D-DIMER (ARMC ONLY): Fibrin derivatives D-dimer (ARMC): 213.19 ng/mL (FEU) (ref 0.00–499.00)

## 2019-08-30 DIAGNOSIS — R200 Anesthesia of skin: Secondary | ICD-10-CM | POA: Insufficient documentation

## 2019-08-30 DIAGNOSIS — R29898 Other symptoms and signs involving the musculoskeletal system: Secondary | ICD-10-CM | POA: Insufficient documentation

## 2019-08-30 DIAGNOSIS — M79641 Pain in right hand: Secondary | ICD-10-CM | POA: Insufficient documentation

## 2019-11-09 ENCOUNTER — Other Ambulatory Visit: Payer: Self-pay | Admitting: Orthopedic Surgery

## 2019-11-09 ENCOUNTER — Other Ambulatory Visit (HOSPITAL_COMMUNITY): Payer: Self-pay | Admitting: Orthopedic Surgery

## 2019-11-09 DIAGNOSIS — M50122 Cervical disc disorder at C5-C6 level with radiculopathy: Secondary | ICD-10-CM

## 2019-11-28 ENCOUNTER — Other Ambulatory Visit: Payer: Self-pay

## 2019-11-28 ENCOUNTER — Ambulatory Visit
Admission: RE | Admit: 2019-11-28 | Discharge: 2019-11-28 | Disposition: A | Discharge status: Home / Self Care | Payer: 59 | Source: Ambulatory Visit | Attending: Orthopedic Surgery | Admitting: Orthopedic Surgery

## 2019-11-28 DIAGNOSIS — M50122 Cervical disc disorder at C5-C6 level with radiculopathy: Secondary | ICD-10-CM | POA: Diagnosis not present

## 2020-01-09 ENCOUNTER — Other Ambulatory Visit (HOSPITAL_COMMUNITY): Payer: Self-pay | Admitting: Internal Medicine

## 2020-01-09 ENCOUNTER — Telehealth: Payer: Self-pay

## 2020-01-09 DIAGNOSIS — Z1231 Encounter for screening mammogram for malignant neoplasm of breast: Secondary | ICD-10-CM

## 2020-01-09 NOTE — Telephone Encounter (Signed)
Contacted patient for lung CT screening clinic based on referral from Dr. Ouida Sills.  Message left for patient to call Burgess Estelle, lung navigator to schedule CT scan.

## 2020-01-22 ENCOUNTER — Telehealth: Payer: Self-pay

## 2020-01-22 NOTE — Telephone Encounter (Signed)
Contacted patient again for lung CT screening clinic.  Message left for patient to call Burgess Estelle, lung navigator to schedule CT scan.

## 2020-01-27 ENCOUNTER — Encounter (INDEPENDENT_AMBULATORY_CARE_PROVIDER_SITE_OTHER): Payer: Self-pay | Admitting: Vascular Surgery

## 2020-02-03 ENCOUNTER — Ambulatory Visit (HOSPITAL_COMMUNITY): Payer: 59

## 2020-02-09 ENCOUNTER — Telehealth: Payer: Self-pay

## 2020-02-09 NOTE — Telephone Encounter (Signed)
Contacted patient again for lung CT screening clinic based on referral from Dr. Ouida Sills.  Message was left for patient to call Burgess Estelle, lung navigator to schedule CT scan.

## 2020-02-23 ENCOUNTER — Ambulatory Visit (HOSPITAL_COMMUNITY): Payer: 59

## 2020-02-23 ENCOUNTER — Encounter (HOSPITAL_COMMUNITY): Payer: Self-pay

## 2020-02-24 ENCOUNTER — Ambulatory Visit (INDEPENDENT_AMBULATORY_CARE_PROVIDER_SITE_OTHER): Payer: 59 | Admitting: Vascular Surgery

## 2020-02-24 ENCOUNTER — Encounter (INDEPENDENT_AMBULATORY_CARE_PROVIDER_SITE_OTHER): Payer: Self-pay | Admitting: Vascular Surgery

## 2020-02-24 ENCOUNTER — Other Ambulatory Visit: Payer: Self-pay

## 2020-02-24 VITALS — BP 107/74 | HR 79 | Resp 16 | Ht 63.0 in | Wt 169.8 lb

## 2020-02-24 DIAGNOSIS — M7989 Other specified soft tissue disorders: Secondary | ICD-10-CM | POA: Insufficient documentation

## 2020-02-24 DIAGNOSIS — M79605 Pain in left leg: Secondary | ICD-10-CM

## 2020-02-24 DIAGNOSIS — I700 Atherosclerosis of aorta: Secondary | ICD-10-CM

## 2020-02-24 DIAGNOSIS — I10 Essential (primary) hypertension: Secondary | ICD-10-CM | POA: Diagnosis not present

## 2020-02-24 DIAGNOSIS — I639 Cerebral infarction, unspecified: Secondary | ICD-10-CM

## 2020-02-24 DIAGNOSIS — M79609 Pain in unspecified limb: Secondary | ICD-10-CM | POA: Insufficient documentation

## 2020-02-24 NOTE — Assessment & Plan Note (Signed)

## 2020-02-24 NOTE — Assessment & Plan Note (Signed)
Over 3 years ago.  Had carotids checked at that time and disease was fairly mild although the vertebral artery have disease.  Do not see any checks since then.  Be evaluated at some point going forward.

## 2020-02-24 NOTE — Assessment & Plan Note (Signed)
I have had a long discussion with the patient regarding swelling and why it  causes symptoms.  Patient will begin wearing graduated compression stockings class 1 (20-30 mmHg) on a daily basis. The patient will  beginning wearing the stockings first thing in the morning and removing them in the evening. The patient is instructed specifically not to sleep in the stockings.   In addition, behavioral modification will be initiated.  This will include frequent elevation, use of over the counter pain medications and exercise such as walking.  I have reviewed systemic causes for chronic edema such as liver, kidney and cardiac etiologies.  The patient denies problems with these organ systems.    Consideration for a lymph pump will also be made based upon the effectiveness of conservative therapy.  This would help to improve the edema control and prevent sequela such as ulcers and infections   Patient should undergo duplex ultrasound of the venous system to ensure that DVT or reflux is not present.  The patient will follow-up with me after the ultrasound.

## 2020-02-24 NOTE — Assessment & Plan Note (Signed)
blood pressure control important in reducing the progression of atherosclerotic disease. On appropriate oral medications.  
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
## 2020-02-24 NOTE — Assessment & Plan Note (Signed)
Seen on the CT scan over 6 years ago.  Also seen on the CT scan in 2017.  Has not been checked in several years.

## 2020-03-02 ENCOUNTER — Ambulatory Visit (HOSPITAL_COMMUNITY): Payer: 59

## 2020-03-06 ENCOUNTER — Encounter: Payer: Self-pay | Admitting: *Deleted

## 2020-03-09 ENCOUNTER — Other Ambulatory Visit: Payer: Self-pay

## 2020-03-09 ENCOUNTER — Ambulatory Visit (INDEPENDENT_AMBULATORY_CARE_PROVIDER_SITE_OTHER): Payer: 59

## 2020-03-09 ENCOUNTER — Ambulatory Visit (INDEPENDENT_AMBULATORY_CARE_PROVIDER_SITE_OTHER): Payer: 59 | Admitting: Nurse Practitioner

## 2020-03-09 ENCOUNTER — Encounter (INDEPENDENT_AMBULATORY_CARE_PROVIDER_SITE_OTHER): Payer: Self-pay | Admitting: Nurse Practitioner

## 2020-03-09 VITALS — BP 134/84 | HR 79 | Resp 17 | Ht 63.0 in | Wt 168.0 lb

## 2020-03-09 DIAGNOSIS — I10 Essential (primary) hypertension: Secondary | ICD-10-CM

## 2020-03-09 DIAGNOSIS — R600 Localized edema: Secondary | ICD-10-CM

## 2020-03-09 DIAGNOSIS — I639 Cerebral infarction, unspecified: Secondary | ICD-10-CM | POA: Diagnosis not present

## 2020-03-09 DIAGNOSIS — M79605 Pain in left leg: Secondary | ICD-10-CM | POA: Diagnosis not present

## 2020-03-09 DIAGNOSIS — M7989 Other specified soft tissue disorders: Secondary | ICD-10-CM | POA: Diagnosis not present

## 2020-03-09 DIAGNOSIS — I6523 Occlusion and stenosis of bilateral carotid arteries: Secondary | ICD-10-CM | POA: Diagnosis not present

## 2020-03-16 ENCOUNTER — Encounter (INDEPENDENT_AMBULATORY_CARE_PROVIDER_SITE_OTHER): Payer: Self-pay | Admitting: Nurse Practitioner
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
## 2020-03-28 ENCOUNTER — Encounter (INDEPENDENT_AMBULATORY_CARE_PROVIDER_SITE_OTHER): Payer: Self-pay | Admitting: Nurse Practitioner

## 2020-03-28 ENCOUNTER — Ambulatory Visit (INDEPENDENT_AMBULATORY_CARE_PROVIDER_SITE_OTHER): Payer: 59

## 2020-03-28 ENCOUNTER — Ambulatory Visit (INDEPENDENT_AMBULATORY_CARE_PROVIDER_SITE_OTHER): Payer: 59 | Admitting: Nurse Practitioner

## 2020-03-28 ENCOUNTER — Other Ambulatory Visit: Payer: Self-pay

## 2020-03-28 VITALS — BP 136/75 | HR 77 | Resp 16 | Wt 169.4 lb

## 2020-03-28 DIAGNOSIS — M79605 Pain in left leg: Secondary | ICD-10-CM | POA: Diagnosis not present

## 2020-03-28 DIAGNOSIS — I739 Peripheral vascular disease, unspecified: Secondary | ICD-10-CM | POA: Diagnosis not present

## 2020-03-28 DIAGNOSIS — I639 Cerebral infarction, unspecified: Secondary | ICD-10-CM | POA: Diagnosis not present

## 2020-03-28 DIAGNOSIS — E785 Hyperlipidemia, unspecified: Secondary | ICD-10-CM

## 2020-03-30 ENCOUNTER — Telehealth (INDEPENDENT_AMBULATORY_CARE_PROVIDER_SITE_OTHER): Payer: Self-pay

## 2020-03-30 NOTE — Telephone Encounter (Signed)
Spoke with the patient and she is scheduled with Dr. Lucky Cowboy for a LLE angio on 04/26/20 with a 8:30 am arrival time to the [facility] Covid testing on 04/24/20 between 8-1 pm at the [facility]. Pre-procedure instructions were discussed and will be mailed.
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
## 2020-04-04 ENCOUNTER — Encounter (INDEPENDENT_AMBULATORY_CARE_PROVIDER_SITE_OTHER): Payer: 59

## 2020-04-04 ENCOUNTER — Ambulatory Visit (INDEPENDENT_AMBULATORY_CARE_PROVIDER_SITE_OTHER): Payer: 59 | Admitting: Nurse Practitioner

## 2020-04-23 ENCOUNTER — Other Ambulatory Visit (INDEPENDENT_AMBULATORY_CARE_PROVIDER_SITE_OTHER): Payer: Self-pay | Admitting: Nurse Practitioner

## 2020-04-24 NOTE — Telephone Encounter (Signed)
Patient called and canceled her LLE angio for 04/26/20 and stated she would call to reschedule when she was ready.

## 2020-04-26 ENCOUNTER — Ambulatory Visit: Admit: 2020-04-26 | Payer: 59 | Admitting: Vascular Surgery

## 2020-04-26 DIAGNOSIS — I70219 Atherosclerosis of native arteries of extremities with intermittent claudication, unspecified extremity: Secondary | ICD-10-CM

## 2020-04-26 SURGERY — LOWER EXTREMITY ANGIOGRAPHY
Anesthesia: Moderate Sedation | Site: Leg Lower | Laterality: Left

## 2020-06-20 ENCOUNTER — Encounter (INDEPENDENT_AMBULATORY_CARE_PROVIDER_SITE_OTHER): Payer: Self-pay

## 2020-07-23 ENCOUNTER — Other Ambulatory Visit: Payer: Self-pay

## 2020-07-23 ENCOUNTER — Encounter (INDEPENDENT_AMBULATORY_CARE_PROVIDER_SITE_OTHER): Payer: Self-pay | Admitting: Nurse Practitioner

## 2020-07-23 ENCOUNTER — Ambulatory Visit (INDEPENDENT_AMBULATORY_CARE_PROVIDER_SITE_OTHER): Payer: 59 | Admitting: Nurse Practitioner

## 2020-07-23 VITALS — BP 117/78 | HR 87 | Resp 16 | Wt 169.6 lb

## 2020-07-23 DIAGNOSIS — I739 Peripheral vascular disease, unspecified: Secondary | ICD-10-CM | POA: Diagnosis not present

## 2020-07-23 DIAGNOSIS — I10 Essential (primary) hypertension: Secondary | ICD-10-CM

## 2020-07-23 DIAGNOSIS — E785 Hyperlipidemia, unspecified: Secondary | ICD-10-CM | POA: Diagnosis not present

## 2020-07-24 ENCOUNTER — Telehealth (INDEPENDENT_AMBULATORY_CARE_PROVIDER_SITE_OTHER): Payer: Self-pay

## 2020-07-24 ENCOUNTER — Encounter (INDEPENDENT_AMBULATORY_CARE_PROVIDER_SITE_OTHER): Payer: Self-pay | Admitting: Nurse Practitioner

## 2020-07-24 NOTE — Telephone Encounter (Signed)
Spoke with the patient and she is scheduled with Dr. Lucky Cowboy for a LLE angio on 07/30/20 with a 8:15 am arrival time to the [facility]. Covid testing on 07/26/20 between 8-2 pm at the [facility]. Pre-procedure instructions were discussed and will be mailed.

## 2020-07-24 NOTE — Telephone Encounter (Signed)
Spoke with the patient and she has been rescheduled from 07/30/20 to 07/26/20 with a 7:45 am arrival time to the [facility]. Patient will do the covid testing on 07/25/20 before 11:00 am.
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
## 2020-07-25 ENCOUNTER — Other Ambulatory Visit: Payer: Self-pay

## 2020-07-25 ENCOUNTER — Other Ambulatory Visit (INDEPENDENT_AMBULATORY_CARE_PROVIDER_SITE_OTHER): Payer: Self-pay | Admitting: Nurse Practitioner

## 2020-07-25 ENCOUNTER — Other Ambulatory Visit
Admission: RE | Admit: 2020-07-25 | Discharge: 2020-07-25 | Disposition: A | Discharge status: Home / Self Care | Payer: 59 | Source: Ambulatory Visit | Attending: Vascular Surgery | Admitting: Vascular Surgery

## 2020-07-25 DIAGNOSIS — Z20822 Contact with and (suspected) exposure to covid-19: Secondary | ICD-10-CM | POA: Insufficient documentation

## 2020-07-25 DIAGNOSIS — Z01812 Encounter for preprocedural laboratory examination: Secondary | ICD-10-CM | POA: Diagnosis present

## 2020-07-25 LAB — SARS CORONAVIRUS 2 (TAT 6-24 HRS): SARS Coronavirus 2: NEGATIVE

## 2020-07-26 ENCOUNTER — Encounter
Admission: RE | Disposition: A | Discharge status: Home / Self Care | Payer: Self-pay | Source: Home / Self Care | Attending: Vascular Surgery

## 2020-07-26 ENCOUNTER — Other Ambulatory Visit: Payer: 59

## 2020-07-26 ENCOUNTER — Encounter: Payer: Self-pay | Admitting: Vascular Surgery

## 2020-07-26 ENCOUNTER — Ambulatory Visit
Admission: RE | Admit: 2020-07-26 | Discharge: 2020-07-26 | Disposition: A | Discharge status: Home / Self Care | Payer: 59 | Attending: Vascular Surgery | Admitting: Vascular Surgery

## 2020-07-26 DIAGNOSIS — Z79899 Other long term (current) drug therapy: Secondary | ICD-10-CM | POA: Insufficient documentation

## 2020-07-26 DIAGNOSIS — E785 Hyperlipidemia, unspecified: Secondary | ICD-10-CM | POA: Insufficient documentation

## 2020-07-26 DIAGNOSIS — Z7902 Long term (current) use of antithrombotics/antiplatelets: Secondary | ICD-10-CM | POA: Insufficient documentation

## 2020-07-26 DIAGNOSIS — Z7982 Long term (current) use of aspirin: Secondary | ICD-10-CM | POA: Diagnosis not present

## 2020-07-26 DIAGNOSIS — Z888 Allergy status to other drugs, medicaments and biological substances status: Secondary | ICD-10-CM | POA: Insufficient documentation

## 2020-07-26 DIAGNOSIS — Z91013 Allergy to seafood: Secondary | ICD-10-CM | POA: Diagnosis not present

## 2020-07-26 DIAGNOSIS — I70229 Atherosclerosis of native arteries of extremities with rest pain, unspecified extremity: Secondary | ICD-10-CM

## 2020-07-26 DIAGNOSIS — Z87891 Personal history of nicotine dependence: Secondary | ICD-10-CM | POA: Diagnosis not present

## 2020-07-26 DIAGNOSIS — I739 Peripheral vascular disease, unspecified: Secondary | ICD-10-CM | POA: Insufficient documentation

## 2020-07-26 DIAGNOSIS — I10 Essential (primary) hypertension: Secondary | ICD-10-CM | POA: Diagnosis not present

## 2020-07-26 DIAGNOSIS — I708 Atherosclerosis of other arteries: Secondary | ICD-10-CM | POA: Insufficient documentation

## 2020-07-26 DIAGNOSIS — I70213 Atherosclerosis of native arteries of extremities with intermittent claudication, bilateral legs: Secondary | ICD-10-CM

## 2020-07-26 HISTORY — PX: LOWER EXTREMITY ANGIOGRAPHY: CATH118251

## 2020-07-26 LAB — BUN: BUN: 17 mg/dL (ref 6–20)

## 2020-07-26 LAB — CREATININE, SERUM
Creatinine, Ser: 1.11 mg/dL — ABNORMAL HIGH (ref 0.44–1.00)
GFR, Estimated: 57 mL/min — ABNORMAL LOW (ref >60)

## 2020-07-26 SURGERY — LOWER EXTREMITY ANGIOGRAPHY
Anesthesia: Moderate Sedation | Site: Leg Lower | Laterality: Left

## 2020-07-26 MED ORDER — DIPHENHYDRAMINE HCL 50 MG/ML IJ SOLN
INTRAMUSCULAR | Status: AC
  Administered 2020-07-26: 50 mg via INTRAVENOUS
  Filled 2020-07-26: qty 1

## 2020-07-26 MED ORDER — FAMOTIDINE 20 MG PO TABS: 40.0000 mg | ORAL_TABLET | Freq: Once | ORAL | Status: AC | PRN

## 2020-07-26 MED ORDER — METHYLPREDNISOLONE SODIUM SUCC 125 MG IJ SOLR: 125.0000 mg | Freq: Once | INTRAMUSCULAR | Status: AC | PRN

## 2020-07-26 MED ORDER — CEFAZOLIN SODIUM-DEXTROSE 2-4 GM/100ML-% IV SOLN
INTRAVENOUS | Status: AC
  Administered 2020-07-26: 2 g via INTRAVENOUS
  Filled 2020-07-26: qty 100

## 2020-07-26 MED ORDER — FAMOTIDINE 20 MG PO TABS
ORAL_TABLET | ORAL | Status: AC
  Administered 2020-07-26: 40 mg via ORAL
  Filled 2020-07-26: qty 1

## 2020-07-26 MED ORDER — SODIUM CHLORIDE 0.9 % IV SOLN: INTRAVENOUS | Status: DC

## 2020-07-26 MED ORDER — MIDAZOLAM HCL 5 MG/5ML IJ SOLN
INTRAMUSCULAR | Status: AC
  Filled 2020-07-26: qty 5

## 2020-07-26 MED ORDER — METHYLPREDNISOLONE SODIUM SUCC 125 MG IJ SOLR
INTRAMUSCULAR | Status: AC
  Administered 2020-07-26: 125 mg via INTRAVENOUS
  Filled 2020-07-26: qty 2

## 2020-07-26 MED ORDER — ONDANSETRON HCL 4 MG/2ML IJ SOLN: 4.0000 mg | Freq: Four times a day (QID) | INTRAMUSCULAR | Status: DC | PRN

## 2020-07-26 MED ORDER — SODIUM CHLORIDE 0.9 % IV SOLN: 250.0000 mL | INTRAVENOUS | Status: DC | PRN

## 2020-07-26 MED ORDER — CEFAZOLIN SODIUM-DEXTROSE 2-4 GM/100ML-% IV SOLN: 2.0000 g | Freq: Once | INTRAVENOUS | Status: AC

## 2020-07-26 MED ORDER — MIDAZOLAM HCL 2 MG/2ML IJ SOLN
INTRAMUSCULAR | Status: DC | PRN
  Administered 2020-07-26: 0.5 mg via INTRAVENOUS
  Administered 2020-07-26: 2 mg via INTRAVENOUS
  Administered 2020-07-26: 0.5 mg via INTRAVENOUS

## 2020-07-26 MED ORDER — SODIUM CHLORIDE 0.9% FLUSH: 3.0000 mL | INTRAVENOUS | Status: DC | PRN

## 2020-07-26 MED ORDER — FENTANYL CITRATE (PF) 100 MCG/2ML IJ SOLN
INTRAMUSCULAR | Status: AC
  Filled 2020-07-26: qty 2

## 2020-07-26 MED ORDER — DIPHENHYDRAMINE HCL 50 MG/ML IJ SOLN: 50.0000 mg | Freq: Once | INTRAMUSCULAR | Status: AC | PRN

## 2020-07-26 MED ORDER — HYDRALAZINE HCL 20 MG/ML IJ SOLN: 5.0000 mg | INTRAMUSCULAR | Status: DC | PRN

## 2020-07-26 MED ORDER — FENTANYL CITRATE (PF) 100 MCG/2ML IJ SOLN
INTRAMUSCULAR | Status: DC | PRN
  Administered 2020-07-26: 12.5 ug via INTRAVENOUS
  Administered 2020-07-26: 50 ug via INTRAVENOUS
  Administered 2020-07-26: 12.5 ug via INTRAVENOUS

## 2020-07-26 MED ORDER — ACETAMINOPHEN 325 MG PO TABS: 650.0000 mg | ORAL_TABLET | ORAL | Status: DC | PRN

## 2020-07-26 MED ORDER — FAMOTIDINE 20 MG PO TABS
ORAL_TABLET | ORAL | Status: AC
  Filled 2020-07-26: qty 1

## 2020-07-26 MED ORDER — HEPARIN SODIUM (PORCINE) 1000 UNIT/ML IJ SOLN
INTRAMUSCULAR | Status: AC
  Filled 2020-07-26: qty 1

## 2020-07-26 MED ORDER — SODIUM CHLORIDE 0.9% FLUSH: 3.0000 mL | Freq: Two times a day (BID) | INTRAVENOUS | Status: DC

## 2020-07-26 MED ORDER — MIDAZOLAM HCL 2 MG/ML PO SYRP: 8.0000 mg | ORAL_SOLUTION | Freq: Once | ORAL | Status: DC | PRN

## 2020-07-26 MED ORDER — HEPARIN SODIUM (PORCINE) 1000 UNIT/ML IJ SOLN
INTRAMUSCULAR | Status: DC | PRN
  Administered 2020-07-26: 5000 [IU] via INTRAVENOUS

## 2020-07-26 MED ORDER — LABETALOL HCL 5 MG/ML IV SOLN: 10.0000 mg | INTRAVENOUS | Status: DC | PRN

## 2020-07-26 MED ORDER — IODIXANOL 320 MG/ML IV SOLN
INTRAVENOUS | Status: DC | PRN
  Administered 2020-07-26: 55 mL via INTRA_ARTERIAL

## 2020-07-26 SURGICAL SUPPLY — 12 items
CATH ANGIO 5F PIGTAIL 65CM (CATHETERS) ×2 IMPLANT
COVER PROBE U/S 5X48 (MISCELLANEOUS) ×2 IMPLANT
DEVICE STARCLOSE SE CLOSURE (Vascular Products) ×4 IMPLANT
GLIDEWIRE ADV .035X180CM (WIRE) ×4 IMPLANT
KIT ENCORE 26 ADVANTAGE (KITS) ×4 IMPLANT
PACK ANGIOGRAPHY (CUSTOM PROCEDURE TRAY) ×2 IMPLANT
SHEATH BRITE TIP 5FRX11 (SHEATH) ×2 IMPLANT
SHEATH BRITE TIP 6FRX11 (SHEATH) ×4 IMPLANT
STENT LIFESTREAM 6X37X80 (Permanent Stent) ×4 IMPLANT
SYR MEDRAD MARK 7 150ML (SYRINGE) ×2 IMPLANT
TUBING CONTRAST HIGH PRESS 72 (TUBING) ×2 IMPLANT
WIRE GUIDERIGHT .035X150 (WIRE) ×2 IMPLANT

## 2020-07-26 NOTE — Interval H&P Note (Signed)
History and Physical Interval Note:  07/26/2020 8:09 AM  [name]  has presented today for surgery, with the diagnosis of LT LE angio  BARD   ASO w rest pain Covid  April 7.  The various methods of treatment have been discussed with the patient and family. After consideration of risks, benefits and other options for treatment, the patient has consented to  Procedure(s): LOWER EXTREMITY ANGIOGRAPHY (Left) as a surgical intervention.  The patient's history has been reviewed, patient examined, no change in status, stable for surgery.  I have reviewed the patient's chart and labs.  Questions were answered to the patient's satisfaction.     Leotis Pain

## 2020-07-26 NOTE — Op Note (Signed)
[facility]  Percutaneous Study/Intervention Procedural Note   Date of Surgery: 07/26/2020  Surgeon(s):DEW,JASON    Assistants:none  Pre-operative Diagnosis: PAD with claudication bilateral lower extremity  Post-operative diagnosis:  Same  Procedure(s) Performed:             1.  Ultrasound guidance for vascular access bilateral femoral arteries             2.  Catheter placement into aorta from bilateral femoral approaches             3.  Aortogram and bilateral iliofemoral angiogram             4.   Kissing balloon stent placements to the distal aorta and both common iliac arteries with a pair of 6 mm diameter by 37 mm length lifestream stents             5.   StarClose closure device bilateral femoral arteries  EBL: 10 cc  Contrast: 55 cc  Fluoro Time: 1.9 minutes  Moderate Conscious Sedation Time: approximately 33 minutes using 3 mg of Versed and 75 mcg of Fentanyl              Indications:  Patient is a 60 y.o.female with claudication symptoms in both lower extremities. The patient has noninvasive study showing aortoiliac disease. The patient is brought in for angiography for further evaluation and potential treatment. Risks and benefits are discussed and informed consent is obtained.   Procedure:  The patient was identified and appropriate procedural time out was performed.  The patient was then placed supine on the table and prepped and draped in the usual sterile fashion. Moderate conscious sedation was administered during a face to face encounter with the patient throughout the procedure with my supervision of the RN administering medicines and monitoring the patient's vital signs, pulse oximetry, telemetry and mental status throughout from the start of the procedure until the patient was taken to the recovery room. Ultrasound was used to evaluate the right common femoral artery.  It was patent but small.  A digital ultrasound image was acquired.  A  Seldinger needle was used to access the right common femoral artery under direct ultrasound guidance and a permanent image was performed.  A 0.035 J wire was advanced without resistance and a 5Fr sheath was placed.  Pigtail catheter was placed into the aorta and an AP aortogram was performed. The renal arteries appeared normal.  The distal aorta was heavily diseased with greater than 60% stenosis spilling into the proximal portions of the right and left common iliac arteries.  The left was more calcific.  The external iliac arteries were fairly normal bilaterally.  The right common femoral artery was small and mildly diseased.  The left common femoral artery was smaller and moderately diseased with the sheath being nearly occlusive in the left common femoral artery. I then accessed the left femoral artery under direct ultrasound guidance without difficulty with a Seldinger needle and a permanent image was recorded.  This was also small and at least moderately diseased.  A 6 French sheath was placed up on the left and we upsized to a 6 [place] sheath on the right.  The patient was heparinized.  I then placed advantage wires into the aorta from bilateral femoral approaches.  After a magnified view of the distal aorta and proximal common iliac arteries performed to evaluate the lesion and plantar treatment, I selected a 6 mm diameter by 37 mm length  lifestream stents bilaterally.  These were taken up about a centimeter to a centimeter and a half into the aorta and then down about 2 cm into both common iliac arteries.  These were inflated simultaneously to 12 atm.  Completion imaging following this showed marked improvement with less than 10% residual stenosis in the distal aorta and proximal common iliac arteries bilaterally.  I elected to terminate the procedure. The sheath was removed and StarClose closure device was deployed in the right femoral artery with excellent hemostatic result.  Similarly, StarClose closure  device was deployed on the left femoral artery with excellent hemostatic result.  The patient was taken to the recovery room in stable condition having tolerated the procedure well.  Findings:               Aortogram:  The renal arteries appeared normal.  The distal aorta was heavily diseased with greater than 60% stenosis spilling into the proximal portions of the right and left common iliac arteries.  The left was more calcific.  The external iliac arteries were fairly normal bilaterally.  The right common femoral artery was small and mildly diseased.  The left common femoral artery was smaller and moderately diseased with the sheath being nearly occlusive in the left common femoral artery.                Disposition: Patient was taken to the recovery room in stable condition having tolerated the procedure well.  Complications: None  Leotis Pain 07/26/2020 9:34 AM   This note was created with Dragon Medical transcription system. Any errors in dictation are purely unintentional.

## 2020-08-23 DIAGNOSIS — I739 Peripheral vascular disease, unspecified: Secondary | ICD-10-CM | POA: Insufficient documentation

## 2020-08-27 ENCOUNTER — Other Ambulatory Visit (INDEPENDENT_AMBULATORY_CARE_PROVIDER_SITE_OTHER): Payer: Self-pay | Admitting: Vascular Surgery

## 2020-08-27 DIAGNOSIS — I70213 Atherosclerosis of native arteries of extremities with intermittent claudication, bilateral legs: Secondary | ICD-10-CM

## 2020-08-27 DIAGNOSIS — Z95820 Peripheral vascular angioplasty status with implants and grafts: Secondary | ICD-10-CM

## 2020-08-28 ENCOUNTER — Ambulatory Visit (INDEPENDENT_AMBULATORY_CARE_PROVIDER_SITE_OTHER): Payer: 59

## 2020-08-28 ENCOUNTER — Ambulatory Visit (INDEPENDENT_AMBULATORY_CARE_PROVIDER_SITE_OTHER): Payer: 59 | Admitting: Nurse Practitioner

## 2020-08-28 ENCOUNTER — Encounter (INDEPENDENT_AMBULATORY_CARE_PROVIDER_SITE_OTHER): Payer: Self-pay | Admitting: Nurse Practitioner

## 2020-08-28 ENCOUNTER — Other Ambulatory Visit: Payer: Self-pay

## 2020-08-28 VITALS — BP 107/72 | HR 89 | Resp 16 | Wt 171.6 lb

## 2020-08-28 DIAGNOSIS — E785 Hyperlipidemia, unspecified: Secondary | ICD-10-CM | POA: Diagnosis not present

## 2020-08-28 DIAGNOSIS — I70213 Atherosclerosis of native arteries of extremities with intermittent claudication, bilateral legs: Secondary | ICD-10-CM

## 2020-08-28 DIAGNOSIS — I10 Essential (primary) hypertension: Secondary | ICD-10-CM

## 2020-08-28 DIAGNOSIS — Z95820 Peripheral vascular angioplasty status with implants and grafts: Secondary | ICD-10-CM

## 2020-09-03 ENCOUNTER — Encounter (INDEPENDENT_AMBULATORY_CARE_PROVIDER_SITE_OTHER): Payer: Self-pay | Admitting: Nurse Practitioner
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
## 2020-11-21 ENCOUNTER — Other Ambulatory Visit (INDEPENDENT_AMBULATORY_CARE_PROVIDER_SITE_OTHER): Payer: Self-pay | Admitting: Vascular Surgery

## 2020-11-21 DIAGNOSIS — I70213 Atherosclerosis of native arteries of extremities with intermittent claudication, bilateral legs: Secondary | ICD-10-CM

## 2020-11-21 DIAGNOSIS — Z95820 Peripheral vascular angioplasty status with implants and grafts: Secondary | ICD-10-CM

## 2020-11-23 ENCOUNTER — Encounter (INDEPENDENT_AMBULATORY_CARE_PROVIDER_SITE_OTHER): Payer: 59

## 2020-11-23 ENCOUNTER — Ambulatory Visit (INDEPENDENT_AMBULATORY_CARE_PROVIDER_SITE_OTHER): Payer: 59 | Admitting: Vascular Surgery

## 2020-12-07 ENCOUNTER — Ambulatory Visit (INDEPENDENT_AMBULATORY_CARE_PROVIDER_SITE_OTHER): Payer: 59 | Admitting: Nurse Practitioner

## 2020-12-07 ENCOUNTER — Ambulatory Visit (INDEPENDENT_AMBULATORY_CARE_PROVIDER_SITE_OTHER): Payer: 59

## 2020-12-07 ENCOUNTER — Other Ambulatory Visit: Payer: Self-pay

## 2020-12-07 VITALS — BP 124/75 | HR 82 | Resp 16 | Wt 168.0 lb

## 2020-12-07 DIAGNOSIS — Z95820 Peripheral vascular angioplasty status with implants and grafts: Secondary | ICD-10-CM

## 2020-12-07 DIAGNOSIS — I70213 Atherosclerosis of native arteries of extremities with intermittent claudication, bilateral legs: Secondary | ICD-10-CM | POA: Diagnosis not present

## 2020-12-07 DIAGNOSIS — I10 Essential (primary) hypertension: Secondary | ICD-10-CM

## 2020-12-07 DIAGNOSIS — E785 Hyperlipidemia, unspecified: Secondary | ICD-10-CM | POA: Diagnosis not present

## 2020-12-09 ENCOUNTER — Encounter (INDEPENDENT_AMBULATORY_CARE_PROVIDER_SITE_OTHER): Payer: Self-pay | Admitting: Nurse Practitioner
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
## 2021-01-04 ENCOUNTER — Other Ambulatory Visit: Payer: Self-pay | Admitting: Internal Medicine

## 2021-01-04 DIAGNOSIS — Z1231 Encounter for screening mammogram for malignant neoplasm of breast: Secondary | ICD-10-CM

## 2021-02-07 ENCOUNTER — Other Ambulatory Visit: Payer: Self-pay | Admitting: Internal Medicine

## 2021-02-07 DIAGNOSIS — N644 Mastodynia: Secondary | ICD-10-CM

## 2021-02-08 ENCOUNTER — Other Ambulatory Visit: Payer: Self-pay | Admitting: Internal Medicine

## 2021-02-08 DIAGNOSIS — N644 Mastodynia: Secondary | ICD-10-CM

## 2021-02-21 ENCOUNTER — Ambulatory Visit
Admission: RE | Admit: 2021-02-21 | Discharge: 2021-02-21 | Disposition: A | Discharge status: Home / Self Care | Payer: 59 | Source: Ambulatory Visit | Attending: Internal Medicine | Admitting: Internal Medicine

## 2021-02-21 ENCOUNTER — Other Ambulatory Visit: Payer: Self-pay

## 2021-02-21 DIAGNOSIS — N644 Mastodynia: Secondary | ICD-10-CM | POA: Diagnosis present

## 2021-04-21 DIAGNOSIS — J40 Bronchitis, not specified as acute or chronic: Secondary | ICD-10-CM

## 2021-04-21 HISTORY — DX: Bronchitis, not specified as acute or chronic: J40

## 2021-05-09 DIAGNOSIS — J019 Acute sinusitis, unspecified: Secondary | ICD-10-CM | POA: Diagnosis not present

## 2021-05-31 DIAGNOSIS — J019 Acute sinusitis, unspecified: Secondary | ICD-10-CM | POA: Diagnosis not present

## 2021-06-07 ENCOUNTER — Encounter (INDEPENDENT_AMBULATORY_CARE_PROVIDER_SITE_OTHER): Payer: 59

## 2021-06-07 ENCOUNTER — Ambulatory Visit (INDEPENDENT_AMBULATORY_CARE_PROVIDER_SITE_OTHER): Payer: 59 | Admitting: Vascular Surgery

## 2021-06-26 ENCOUNTER — Encounter: Payer: Self-pay | Admitting: Emergency Medicine

## 2021-06-26 ENCOUNTER — Emergency Department
Admission: EM | Admit: 2021-06-26 | Discharge: 2021-06-26 | Disposition: A | Discharge status: Home / Self Care | Payer: 59 | Attending: Emergency Medicine | Admitting: Emergency Medicine

## 2021-06-26 ENCOUNTER — Other Ambulatory Visit: Payer: Self-pay

## 2021-06-26 ENCOUNTER — Emergency Department: Payer: 59

## 2021-06-26 DIAGNOSIS — J449 Chronic obstructive pulmonary disease, unspecified: Secondary | ICD-10-CM | POA: Diagnosis not present

## 2021-06-26 DIAGNOSIS — I10 Essential (primary) hypertension: Secondary | ICD-10-CM | POA: Diagnosis not present

## 2021-06-26 DIAGNOSIS — R112 Nausea with vomiting, unspecified: Secondary | ICD-10-CM | POA: Insufficient documentation

## 2021-06-26 DIAGNOSIS — R34 Anuria and oliguria: Secondary | ICD-10-CM | POA: Insufficient documentation

## 2021-06-26 DIAGNOSIS — R1084 Generalized abdominal pain: Secondary | ICD-10-CM | POA: Insufficient documentation

## 2021-06-26 DIAGNOSIS — N189 Chronic kidney disease, unspecified: Secondary | ICD-10-CM | POA: Diagnosis not present

## 2021-06-26 DIAGNOSIS — I700 Atherosclerosis of aorta: Secondary | ICD-10-CM | POA: Diagnosis not present

## 2021-06-26 DIAGNOSIS — I2510 Atherosclerotic heart disease of native coronary artery without angina pectoris: Secondary | ICD-10-CM | POA: Diagnosis not present

## 2021-06-26 DIAGNOSIS — Z20822 Contact with and (suspected) exposure to covid-19: Secondary | ICD-10-CM | POA: Insufficient documentation

## 2021-06-26 DIAGNOSIS — R197 Diarrhea, unspecified: Secondary | ICD-10-CM | POA: Insufficient documentation

## 2021-06-26 DIAGNOSIS — K5730 Diverticulosis of large intestine without perforation or abscess without bleeding: Secondary | ICD-10-CM | POA: Diagnosis not present

## 2021-06-26 LAB — COMPREHENSIVE METABOLIC PANEL
ALT: 15 U/L (ref 0–44)
AST: 15 U/L (ref 15–41)
Albumin: 3.5 g/dL (ref 3.5–5.0)
Alkaline Phosphatase: 80 U/L (ref 38–126)
Anion gap: 9 (ref 5–15)
BUN: 13 mg/dL (ref 6–20)
CO2: 22 mmol/L (ref 22–32)
Calcium: 8.7 mg/dL — ABNORMAL LOW (ref 8.9–10.3)
Chloride: 107 mmol/L (ref 98–111)
Creatinine, Ser: 1.1 mg/dL — ABNORMAL HIGH (ref 0.44–1.00)
GFR, Estimated: 58 mL/min — ABNORMAL LOW (ref >60)
Glucose, Bld: 120 mg/dL — ABNORMAL HIGH (ref 70–99)
Potassium: 3.5 mmol/L (ref 3.5–5.1)
Sodium: 138 mmol/L (ref 135–145)
Total Bilirubin: 0.6 mg/dL (ref 0.3–1.2)
Total Protein: 6.7 g/dL (ref 6.5–8.1)

## 2021-06-26 LAB — CBC WITH DIFFERENTIAL/PLATELET
Abs Immature Granulocytes: 0.04 10*3/uL (ref 0.00–0.07)
Basophils Absolute: 0 10*3/uL (ref 0.0–0.1)
Basophils Relative: 0 %
Eosinophils Absolute: 0.5 10*3/uL (ref 0.0–0.5)
Eosinophils Relative: 6 %
HCT: 44.1 % (ref 36.0–46.0)
Hemoglobin: 14 g/dL (ref 12.0–15.0)
Immature Granulocytes: 0 %
Lymphocytes Relative: 21 %
Lymphs Abs: 2 10*3/uL (ref 0.7–4.0)
MCH: 31 pg (ref 26.0–34.0)
MCHC: 31.7 g/dL (ref 30.0–36.0)
MCV: 97.6 fL (ref 80.0–100.0)
Monocytes Absolute: 0.9 10*3/uL (ref 0.1–1.0)
Monocytes Relative: 9 %
Neutro Abs: 6 10*3/uL (ref 1.7–7.7)
Neutrophils Relative %: 64 %
Platelets: 259 10*3/uL (ref 150–400)
RBC: 4.52 MIL/uL (ref 3.87–5.11)
RDW: 12.1 % (ref 11.5–15.5)
WBC: 9.5 10*3/uL (ref 4.0–10.5)
nRBC: 0 % (ref 0.0–0.2)

## 2021-06-26 LAB — RESP PANEL BY RT-PCR (FLU A&B, COVID) ARPGX2
Influenza A by PCR: NEGATIVE
Influenza B by PCR: NEGATIVE
SARS Coronavirus 2 by RT PCR: NEGATIVE

## 2021-06-26 LAB — MAGNESIUM: Magnesium: 1.7 mg/dL (ref 1.7–2.4)

## 2021-06-26 MED ORDER — IOHEXOL 300 MG/ML  SOLN
100.0000 mL | Freq: Once | INTRAMUSCULAR | Status: AC | PRN
  Administered 2021-06-26: 100 mL via INTRAVENOUS

## 2021-06-26 MED ORDER — MORPHINE SULFATE (PF) 4 MG/ML IV SOLN
4.0000 mg | Freq: Once | INTRAVENOUS | Status: AC
  Administered 2021-06-26: 4 mg via INTRAVENOUS
  Filled 2021-06-26: qty 1

## 2021-06-26 MED ORDER — LACTATED RINGERS IV BOLUS
1000.0000 mL | Freq: Once | INTRAVENOUS | Status: AC
  Administered 2021-06-26: 1000 mL via INTRAVENOUS

## 2021-06-26 MED ORDER — ONDANSETRON HCL 4 MG/2ML IJ SOLN
4.0000 mg | Freq: Once | INTRAMUSCULAR | Status: AC
  Administered 2021-06-26: 4 mg via INTRAVENOUS
  Filled 2021-06-26: qty 2

## 2021-06-26 MED ORDER — DICYCLOMINE HCL 10 MG PO CAPS
20.0000 mg | ORAL_CAPSULE | Freq: Once | ORAL | Status: AC
  Administered 2021-06-26: 20 mg via ORAL
  Filled 2021-06-26: qty 2

## 2021-06-26 MED ORDER — DROPERIDOL 2.5 MG/ML IJ SOLN
2.5000 mg | Freq: Once | INTRAMUSCULAR | Status: AC
  Administered 2021-06-26: 2.5 mg via INTRAVENOUS
  Filled 2021-06-26: qty 2

## 2021-06-26 MED ORDER — ONDANSETRON 4 MG PO TBDP
4.0000 mg | ORAL_TABLET | Freq: Three times a day (TID) | ORAL | 0 refills | Status: DC | PRN
Start: 2021-06-26 — End: 2021-11-20

## 2021-06-26 NOTE — ED Provider Notes (Signed)
Procedures ? ?Clinical Course as of 06/26/21 0821  ?Wed Jun 26, 2021  ?81 Reassessed. [DS]  ?[place] and discussed CT results.  We discussed lesion to the right kidney and need for follow-up MRI.  She expresses understanding and [place].  She is requesting medications due to bloating sensation and concern for gas in her abdomen.  Requesting Gas-X [DS]  ?[id number] Reassessed.  Feeling much better after droperidol.  We discussed need for urinalysis and the possibility of going home after this and she is agreeable. [DS]  ?  ?Clinical Course User Index ?[DS] Vladimir Crofts, MD  ? ? ?----------------------------------------- ?8:21 AM on 06/26/2021 ?----------------------------------------- ? ?Patient now reports that she does not feel like she needs to urinate to provide a urine sample, but she is feeling much better and wants to go home.  She is tolerating oral intake, ambulatory.  Not requiring admission due to the pronounced improvement in symptoms and ability to tolerate oral intake at this point. ? ?  ?Carrie Mew, MD ?06/26/21 [phone] ? ?

## 2021-06-26 NOTE — ED Triage Notes (Signed)
Pt to triage via w/c with no distress noted; reports N/V and generalized abd pain since Saturday ?

## 2021-06-26 NOTE — Discharge Instructions (Addendum)
Use the Zofran as needed for any further nausea and vomiting. ? ?Use Tylenol for pain and fevers.  Up to 1000 mg per dose, up to 4 times per day.  Do not take more than 4000 mg of Tylenol/acetaminophen within 24 hours.. ? ?As we discussed, please follow-up with your PCP to discuss having an outpatient MRI performed to better assess the small spot on your right kidney. ?

## 2021-06-26 NOTE — ED Provider Notes (Signed)
[facility] Provider Note    Event Date/Time   First MD Initiated Contact with Patient 06/26/21 [phone]     (approximate)   History   Diarrhea   HPI  [name] is a 61 y.o. female who presents to the ED for evaluation of Diarrhea   Reviewed PCP visit from September 15.  History of HLD, PAD, COPD, CAD and CKD.  Patient presents to the ED, accompanied by her son, for evaluation of increasing weakness and abdominal bloating/cramping in the setting of diarrhea over the past 3 to 4 days.  She reports awakening Sunday morning with a diarrheal illness having continuous diarrhea for about 4 hours.  She reports a brief fever on Sunday as well, but no recurrence of fever.  She has continued to have significant nausea and diarrhea every day for the past 3 days.  Reports only 2 episodes of emesis and minimizes this.  Reports increasing generalized weakness, bloating sensation and pain.  She could not take it anymore this morning so she presents for evaluation.  Reports decreased urinary output without dysuria or hematuria.  Denies syncope or falls.  Denies chest pain.  Physical Exam   Triage Vital Signs: ED Triage Vitals  Enc Vitals Group     BP 06/26/21 0353 (!) 146/118     Pulse Rate 06/26/21 0353 (!) 105     Resp 06/26/21 0353 18     Temp 06/26/21 0353 97.9 F (36.6 C)     Temp Source 06/26/21 0353 Oral     SpO2 06/26/21 0353 96 %     Weight 06/26/21 0351 162 lb (73.5 kg)     Height 06/26/21 0351 '5\' 2"'$  (1.575 m)     Head Circumference --      Peak Flow --      Pain Score 06/26/21 0351 6     Pain Loc --      Pain Edu? --      Excl. in [place]? --     Most recent vital signs: Vitals:   06/26/21 0500 06/26/21 0600  BP: (!) 149/69 130/87  Pulse: 77 97  Resp: 12 19  Temp:    SpO2: 99% 100%    General: Awake, no distress.  Appears uncomfortable.  Dry mucous membranes. CV:  Good peripheral perfusion.  Tachycardic and regular Resp:  Normal effort.   Abd:  No distention appreciated.  Diffuse tenderness without guarding or peritoneal features. MSK:  No deformity noted.  No pitting edema appreciated Neuro:  No focal deficits appreciated. Other:     ED Results / Procedures / Treatments   Labs (all labs ordered are listed, but only abnormal results are displayed) Labs Reviewed  COMPREHENSIVE METABOLIC PANEL - Abnormal; Notable for the following components:      Result Value   Glucose, Bld 120 (*)    Creatinine, Ser 1.10 (*)    Calcium 8.7 (*)    GFR, Estimated 58 (*)    All other components within normal limits  RESP PANEL BY RT-PCR (FLU A&B, COVID) ARPGX2  GASTROINTESTINAL PANEL BY PCR, STOOL (REPLACES STOOL CULTURE)  C DIFFICILE QUICK SCREEN W PCR REFLEX    CBC WITH DIFFERENTIAL/PLATELET  MAGNESIUM  URINALYSIS, ROUTINE W REFLEX MICROSCOPIC    EKG Sinus rhythm, rate of 89 bpm.  Rightward axis and normal intervals.  Nonspecific ST changes inferiorly without STEMI.  RADIOLOGY CT abdomen/pelvis reviewed by me with small cystic structure to inferior pole of the right kidney, but no  SBO  Official radiology report(s): CT ABDOMEN PELVIS W CONTRAST  Result Date: 06/26/2021 CLINICAL DATA:  61 year old female with history of diarrhea and abdominal pain. Bloating. Possible small bowel obstruction. EXAM: CT ABDOMEN AND PELVIS WITH CONTRAST TECHNIQUE: Multidetector CT imaging of the abdomen and pelvis was performed using the standard protocol following bolus administration of intravenous contrast. RADIATION DOSE REDUCTION: This exam was performed according to the departmental dose-optimization program which includes automated exposure control, adjustment of the mA and/or kV according to patient size and/or use of iterative reconstruction technique. CONTRAST:  19m OMNIPAQUE IOHEXOL 300 MG/ML  SOLN COMPARISON:  CT the abdomen and pelvis 04/12/2016. FINDINGS: Lower chest: Aortic atherosclerosis. Atherosclerotic plaque in the right coronary  artery. Hepatobiliary: No suspicious cystic or solid hepatic lesions. No intra or extrahepatic biliary ductal dilatation. Status post cholecystectomy. Pancreas: No pancreatic mass. No pancreatic ductal dilatation. No pancreatic or peripancreatic fluid collections or inflammatory changes. Spleen: Unremarkable. Adrenals/Urinary Tract: Horseshoe kidney (normal anatomical variant) again noted. Multiple subcentimeter low-attenuation lesions in both of the renal moieties, similar to the prior study, too small to characterize, but statistically likely to represent cysts. In addition, in the lower pole of the right renal moiety there is a 1.6 x 1.2 x 1.1 cm lesion (axial image 45 of series 2 and coronal image 48 of series 6) which appears to have several internal septations, and is new compared to the prior study from 2017, considered indeterminate. No hydroureteronephrosis. Urinary bladder is unremarkable in appearance. Stomach/Bowel: The appearance of the stomach is normal. No pathologic dilatation of small bowel or colon. A few scattered colonic diverticulae are noted, particularly in the sigmoid colon, without surrounding inflammatory changes to indicate an acute diverticulitis at this time. Normal appendix. Vascular/Lymphatic: Aortic atherosclerosis, with small caliber stents in the proximal common iliac arteries bilaterally which appear grossly patent. No definite aneurysm or dissection noted in the abdominal or pelvic vasculature. No lymphadenopathy noted in the abdomen or pelvis. Reproductive: Uterus and ovaries are unremarkable in appearance. Other: No significant volume of ascites.  No pneumoperitoneum. Musculoskeletal: There are no aggressive appearing lytic or blastic lesions noted in the visualized portions of the skeleton. IMPRESSION: 1. No acute findings are noted in the abdomen or pelvis to account for the patient's symptoms. 2. Indeterminate lesion in the lower pole of the right kidney measuring 1.6 x 1.2 x  1.1 cm with several internal septations, new compared to prior study from 2017. Further evaluation with nonemergent abdominal MRI with and without IV gadolinium is strongly recommended in the near future to exclude the possibility of a cystic renal cell carcinoma. 3. Colonic diverticulosis without evidence of acute diverticulitis at this time. 4. Horseshoe kidney (normal anatomical variant) again noted. 5. Aortic atherosclerosis, in addition to at least right coronary artery disease. Please note that although the presence of coronary artery calcium documents the presence of coronary artery disease, the severity of this disease and any potential stenosis cannot be assessed on this non-gated CT examination. Assessment for potential risk factor modification, dietary therapy or pharmacologic therapy may be warranted, if clinically indicated. Electronically Signed   By: DVinnie LangtonM.D.   On: 06/26/2021 05:44    PROCEDURES and INTERVENTIONS:  Procedures  Medications  ondansetron (ZOFRAN) injection 4 mg (4 mg Intravenous Given 06/26/21 0420)  lactated ringers bolus 1,000 mL (0 mLs Intravenous Stopped 06/26/21 0625)  morphine (PF) 4 MG/ML injection 4 mg (4 mg Intravenous Given 06/26/21 0437)  iohexol (OMNIPAQUE) 300 MG/ML solution 100 mL (100 mLs  Intravenous Contrast Given 06/26/21 0525)  dicyclomine (BENTYL) capsule 20 mg (20 mg Oral Given 06/26/21 0623)  droperidol (INAPSINE) 2.5 MG/ML injection 2.5 mg (2.5 mg Intravenous Given 06/26/21 0867)     IMPRESSION / MDM / ASSESSMENT AND PLAN / ED COURSE  I reviewed the triage vital signs and the nursing notes.  60 year old female presents to the ED with nausea, vomiting and diarrhea, possibly due to a viral gastroenteritis.  She is tachycardic on arrival and appears quite uncomfortable, with her tachycardia resolving with IV fluids.  Provide IV analgesics and antiemetics with improvement of her symptoms.  Blood work is quite reassuring without leukocytosis,  electrolyte derangements or renal dysfunction.  CKD at baseline.  CT abdomen/pelvis without evidence of megacolon, radiographic signs of colitis, SBO.  Incidental lesion on the right kidney is related to the patient.  Tolerating p.o. intake and improving after droperidol.  Awaiting urinalysis and so patient is signed out to oncoming provider to follow-up on this UA.  Anticipate she will be suitable for outpatient management regardless of these results, plus or minus antibiotics.  Clinical Course as of 06/26/21 0643  Wed Jun 26, 2021  0542 Reassessed. [DS]  6195 Reassessed and discussed CT results.  We discussed lesion to the right kidney and need for follow-up MRI.  She expresses understanding and [place].  She is requesting medications due to bloating sensation and concern for gas in her abdomen.  Requesting Gas-X [DS]  [id number] Reassessed.  Feeling much better after droperidol.  We discussed need for urinalysis and the possibility of going home after this and she is agreeable. [DS]    Clinical Course User Index [DS] Vladimir Crofts, MD     FINAL CLINICAL IMPRESSION(S) / ED DIAGNOSES   Final diagnoses:  Nausea vomiting and diarrhea     Rx / DC Orders   ED Discharge Orders          Ordered    ondansetron (ZOFRAN-ODT) 4 MG disintegrating tablet  Every 8 hours PRN        06/26/21 0932             Note:  This document was prepared using Dragon voice recognition software and may include unintentional dictation errors.   Vladimir Crofts, MD 06/26/21 [phone]

## 2021-06-26 NOTE — ED Notes (Signed)
Went to introduce myself to pt, ask her if she could give me a urine sample, pt reports that she still feels like she can not give me even a little. I told her that if she is not able to give me a sample I may have to put a tube up her bladder to get a sample ?

## 2021-07-02 ENCOUNTER — Other Ambulatory Visit: Payer: Self-pay

## 2021-07-02 ENCOUNTER — Other Ambulatory Visit: Payer: Self-pay | Admitting: Internal Medicine

## 2021-07-02 ENCOUNTER — Ambulatory Visit (INDEPENDENT_AMBULATORY_CARE_PROVIDER_SITE_OTHER): Payer: 59 | Admitting: Internal Medicine

## 2021-07-02 ENCOUNTER — Encounter: Payer: Self-pay | Admitting: Internal Medicine

## 2021-07-02 VITALS — BP 140/90 | HR 82 | Ht 63.0 in | Wt 166.9 lb

## 2021-07-02 DIAGNOSIS — M542 Cervicalgia: Secondary | ICD-10-CM

## 2021-07-02 DIAGNOSIS — D3001 Benign neoplasm of right kidney: Secondary | ICD-10-CM | POA: Diagnosis not present

## 2021-07-02 DIAGNOSIS — I10 Essential (primary) hypertension: Secondary | ICD-10-CM

## 2021-07-02 DIAGNOSIS — R1900 Intra-abdominal and pelvic swelling, mass and lump, unspecified site: Secondary | ICD-10-CM

## 2021-07-02 DIAGNOSIS — I739 Peripheral vascular disease, unspecified: Secondary | ICD-10-CM | POA: Diagnosis not present

## 2021-07-02 DIAGNOSIS — I639 Cerebral infarction, unspecified: Secondary | ICD-10-CM

## 2021-07-02 DIAGNOSIS — J410 Simple chronic bronchitis: Secondary | ICD-10-CM | POA: Diagnosis not present

## 2021-07-02 DIAGNOSIS — K219 Gastro-esophageal reflux disease without esophagitis: Secondary | ICD-10-CM

## 2021-07-02 DIAGNOSIS — J323 Chronic sphenoidal sinusitis: Secondary | ICD-10-CM

## 2021-07-02 LAB — POCT URINALYSIS DIPSTICK
Appearance: NORMAL
Bilirubin, UA: NEGATIVE
Blood, UA: NEGATIVE
Glucose, UA: NEGATIVE
Ketones, UA: NEGATIVE
Leukocytes, UA: NEGATIVE
Nitrite, UA: NEGATIVE
Protein, UA: NEGATIVE
Spec Grav, UA: <=1.005 — AB (ref 1.010–1.025)
Urobilinogen, UA: NEGATIVE E.U./dL — AB
pH, UA: 7 (ref 5.0–8.0)

## 2021-07-02 MED ORDER — ATORVASTATIN CALCIUM 20 MG PO TABS: 40.0000 mg | ORAL_TABLET | Freq: Every day | ORAL | 2 refills | Status: DC

## 2021-07-02 NOTE — Addendum Note (Signed)
Addended by: Lacretia Nicks L on: 07/02/2021 11:11 AM ? ? Modules accepted: Orders ? ?

## 2021-07-02 NOTE — Assessment & Plan Note (Signed)
Patient is under care of her vascular surgeon ?

## 2021-07-02 NOTE — Assessment & Plan Note (Signed)
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
## 2021-07-02 NOTE — Assessment & Plan Note (Signed)
-   The patient's GERD is stable on medication.  - Instructed the patient to avoid eating spicy and acidic foods, as well as foods high in fat. - Instructed the patient to avoid eating large meals or meals 2-3 hours prior to sleeping. 

## 2021-07-02 NOTE — Assessment & Plan Note (Signed)
Stable at the present time patient was advised to lose weight.  She has stopped smoking ?

## 2021-07-02 NOTE — Assessment & Plan Note (Signed)
Patient has osteoarthritis of the neck ?

## 2021-07-02 NOTE — Assessment & Plan Note (Signed)
Stable now. 

## 2021-07-02 NOTE — Assessment & Plan Note (Signed)
CT scan shows complicated right renal cyst, will get an MRI we will also refer to the urologist, ?

## 2021-07-08 ENCOUNTER — Encounter: Payer: Self-pay | Admitting: Internal Medicine

## 2021-07-08 ENCOUNTER — Other Ambulatory Visit: Payer: Self-pay | Admitting: *Deleted

## 2021-07-08 MED ORDER — ATORVASTATIN CALCIUM 40 MG PO TABS: 40.0000 mg | ORAL_TABLET | Freq: Every day | ORAL | 3 refills | Status: DC

## 2021-07-09 ENCOUNTER — Encounter: Payer: Self-pay | Admitting: Internal Medicine

## 2021-07-10 ENCOUNTER — Ambulatory Visit: Admission: RE | Admit: 2021-07-10 | Payer: 59 | Source: Ambulatory Visit

## 2021-07-11 ENCOUNTER — Other Ambulatory Visit: Payer: Self-pay

## 2021-07-11 DIAGNOSIS — N2889 Other specified disorders of kidney and ureter: Secondary | ICD-10-CM

## 2021-07-20 ENCOUNTER — Ambulatory Visit: Payer: 59

## 2021-07-22 ENCOUNTER — Other Ambulatory Visit: Payer: Self-pay | Admitting: *Deleted

## 2021-07-22 DIAGNOSIS — R140 Abdominal distension (gaseous): Secondary | ICD-10-CM

## 2021-07-26 ENCOUNTER — Ambulatory Visit (INDEPENDENT_AMBULATORY_CARE_PROVIDER_SITE_OTHER): Payer: Self-pay | Admitting: Vascular Surgery

## 2021-07-26 ENCOUNTER — Encounter (INDEPENDENT_AMBULATORY_CARE_PROVIDER_SITE_OTHER): Payer: Self-pay

## 2021-07-29 ENCOUNTER — Ambulatory Visit
Admission: RE | Admit: 2021-07-29 | Discharge: 2021-07-29 | Disposition: A | Discharge status: Home / Self Care | Payer: 59 | Source: Ambulatory Visit | Attending: Internal Medicine | Admitting: Internal Medicine

## 2021-07-29 ENCOUNTER — Other Ambulatory Visit: Payer: Self-pay

## 2021-07-29 DIAGNOSIS — Z9049 Acquired absence of other specified parts of digestive tract: Secondary | ICD-10-CM | POA: Diagnosis not present

## 2021-07-29 DIAGNOSIS — N2889 Other specified disorders of kidney and ureter: Secondary | ICD-10-CM | POA: Insufficient documentation

## 2021-07-29 DIAGNOSIS — Q631 Lobulated, fused and horseshoe kidney: Secondary | ICD-10-CM | POA: Diagnosis not present

## 2021-07-29 DIAGNOSIS — I700 Atherosclerosis of aorta: Secondary | ICD-10-CM | POA: Diagnosis not present

## 2021-07-29 MED ORDER — FLUCONAZOLE 150 MG PO TABS: 150.0000 mg | ORAL_TABLET | Freq: Once | ORAL | 1 refills | Status: DC

## 2021-07-29 MED ORDER — GADOBUTROL 1 MMOL/ML IV SOLN
7.0000 mL | Freq: Once | INTRAVENOUS | Status: AC | PRN
  Administered 2021-07-29: 7 mL via INTRAVENOUS

## 2021-07-31 ENCOUNTER — Ambulatory Visit: Payer: 59 | Admitting: Urology

## 2021-07-31 VITALS — BP 117/88 | HR 150 | Ht 63.0 in | Wt 162.0 lb

## 2021-07-31 DIAGNOSIS — N2889 Other specified disorders of kidney and ureter: Secondary | ICD-10-CM

## 2021-07-31 DIAGNOSIS — N200 Calculus of kidney: Secondary | ICD-10-CM

## 2021-07-31 LAB — URINALYSIS, COMPLETE
Bilirubin, UA: NEGATIVE
Glucose, UA: NEGATIVE
Ketones, UA: NEGATIVE
Leukocytes,UA: NEGATIVE
Nitrite, UA: NEGATIVE
Protein,UA: NEGATIVE
RBC, UA: NEGATIVE
Specific Gravity, UA: 1.02 (ref 1.005–1.030)
Urobilinogen, Ur: 0.2 mg/dL (ref 0.2–1.0)
pH, UA: 6.5 (ref 5.0–7.5)

## 2021-07-31 LAB — MICROSCOPIC EXAMINATION: Bacteria, UA: NONE SEEN
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
## 2021-08-01 ENCOUNTER — Ambulatory Visit (INDEPENDENT_AMBULATORY_CARE_PROVIDER_SITE_OTHER): Payer: 59 | Admitting: Nurse Practitioner

## 2021-08-01 ENCOUNTER — Encounter: Payer: Self-pay | Admitting: Urology

## 2021-08-01 VITALS — BP 173/106 | HR 87 | Ht 63.0 in | Wt 163.4 lb

## 2021-08-01 DIAGNOSIS — N2889 Other specified disorders of kidney and ureter: Secondary | ICD-10-CM | POA: Diagnosis not present

## 2021-08-01 DIAGNOSIS — F419 Anxiety disorder, unspecified: Secondary | ICD-10-CM | POA: Diagnosis not present

## 2021-08-01 DIAGNOSIS — R69 Illness, unspecified: Secondary | ICD-10-CM | POA: Diagnosis not present

## 2021-08-01 DIAGNOSIS — I10 Essential (primary) hypertension: Secondary | ICD-10-CM

## 2021-08-01 MED ORDER — LOSARTAN POTASSIUM 100 MG PO TABS: 100.0000 mg | ORAL_TABLET | Freq: Every day | ORAL | 1 refills | Status: DC

## 2021-08-01 MED ORDER — DIAZEPAM 5 MG PO TABS: 5.0000 mg | ORAL_TABLET | Freq: Two times a day (BID) | ORAL | 0 refills | Status: DC

## 2021-08-06 ENCOUNTER — Telehealth: Payer: Self-pay | Admitting: *Deleted

## 2021-08-06 NOTE — Telephone Encounter (Signed)
Please let patient know I will have Dr. Elder Negus. Diam[facility] review her CT/MRI to see if the mass is amenable to laparoscopic removal and if so an appointment will be scheduled. ?

## 2021-08-06 NOTE — Telephone Encounter (Signed)
Patient would like to do the laparoscopic kidney surgery and would like to be scheduled with them ?

## 2021-08-07 ENCOUNTER — Other Ambulatory Visit (INDEPENDENT_AMBULATORY_CARE_PROVIDER_SITE_OTHER): Payer: Self-pay | Admitting: Nurse Practitioner

## 2021-08-07 ENCOUNTER — Encounter: Payer: Self-pay | Admitting: Nurse Practitioner

## 2021-08-07 DIAGNOSIS — I70213 Atherosclerosis of native arteries of extremities with intermittent claudication, bilateral legs: Secondary | ICD-10-CM

## 2021-08-07 DIAGNOSIS — N2889 Other specified disorders of kidney and ureter: Secondary | ICD-10-CM | POA: Insufficient documentation

## 2021-08-07 NOTE — Assessment & Plan Note (Signed)
Refilled valium.
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
## 2021-08-07 NOTE — Assessment & Plan Note (Signed)
Continue the current medication. ?Refilled losartan 100 mg ?

## 2021-08-07 NOTE — Assessment & Plan Note (Signed)
Followed by the urologist.  ?

## 2021-08-09 ENCOUNTER — Ambulatory Visit (INDEPENDENT_AMBULATORY_CARE_PROVIDER_SITE_OTHER): Payer: 59

## 2021-08-09 ENCOUNTER — Encounter (INDEPENDENT_AMBULATORY_CARE_PROVIDER_SITE_OTHER): Payer: Self-pay | Admitting: Vascular Surgery

## 2021-08-09 ENCOUNTER — Other Ambulatory Visit: Payer: Self-pay | Admitting: Internal Medicine

## 2021-08-09 ENCOUNTER — Encounter: Payer: Self-pay | Admitting: Urology

## 2021-08-09 ENCOUNTER — Ambulatory Visit (INDEPENDENT_AMBULATORY_CARE_PROVIDER_SITE_OTHER): Payer: 59 | Admitting: Vascular Surgery

## 2021-08-09 VITALS — BP 149/82 | HR 80 | Resp 18 | Ht 63.0 in | Wt 163.2 lb

## 2021-08-09 DIAGNOSIS — I739 Peripheral vascular disease, unspecified: Secondary | ICD-10-CM | POA: Diagnosis not present

## 2021-08-09 DIAGNOSIS — I10 Essential (primary) hypertension: Secondary | ICD-10-CM

## 2021-08-09 DIAGNOSIS — I639 Cerebral infarction, unspecified: Secondary | ICD-10-CM

## 2021-08-09 DIAGNOSIS — I70213 Atherosclerosis of native arteries of extremities with intermittent claudication, bilateral legs: Secondary | ICD-10-CM

## 2021-08-09 DIAGNOSIS — I700 Atherosclerosis of aorta: Secondary | ICD-10-CM | POA: Diagnosis not present

## 2021-08-09 DIAGNOSIS — E785 Hyperlipidemia, unspecified: Secondary | ICD-10-CM | POA: Diagnosis not present
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
## 2021-08-09 NOTE — Assessment & Plan Note (Signed)
ABIs today are normal at 1.03 on the right and 1.06 on the left with multiphasic waveforms and normal digital pressures.  1 year status post aortoiliac intervention with marked improvement in symptoms.  Continue current medical regimen.  Can hold Plavix for upcoming urologic surgery as needed.  Recheck in 1 year. ?

## 2021-08-09 NOTE — Assessment & Plan Note (Signed)
Previous history of stroke with upcoming urologic surgery.  I think a carotid duplex would be prudent given her significant history of atherosclerosis elsewhere.  This can be done at her convenience. ?

## 2021-08-09 NOTE — Assessment & Plan Note (Signed)
blood pressure control important in reducing the progression of atherosclerotic disease. On appropriate oral medications.  

## 2021-08-09 NOTE — Assessment & Plan Note (Signed)
lipid control important in reducing the progression of atherosclerotic disease. Continue statin therapy  

## 2021-08-15 NOTE — Telephone Encounter (Signed)
Spoke with [name].  She would like a referral to IR for a consult for the percutaneous ablation.  Please place the referral if you feel this is appropriate.  ? ?If not, please advise. ?

## 2021-08-19 ENCOUNTER — Other Ambulatory Visit: Payer: Self-pay

## 2021-08-19 ENCOUNTER — Other Ambulatory Visit: Payer: Self-pay | Admitting: Urology

## 2021-08-19 DIAGNOSIS — Z8619 Personal history of other infectious and parasitic diseases: Secondary | ICD-10-CM

## 2021-08-19 DIAGNOSIS — N2889 Other specified disorders of kidney and ureter: Secondary | ICD-10-CM

## 2021-08-23 ENCOUNTER — Ambulatory Visit (INDEPENDENT_AMBULATORY_CARE_PROVIDER_SITE_OTHER): Payer: 59 | Admitting: Vascular Surgery

## 2021-08-23 ENCOUNTER — Encounter (INDEPENDENT_AMBULATORY_CARE_PROVIDER_SITE_OTHER): Payer: Self-pay | Admitting: Vascular Surgery

## 2021-08-23 ENCOUNTER — Ambulatory Visit (INDEPENDENT_AMBULATORY_CARE_PROVIDER_SITE_OTHER): Payer: 59

## 2021-08-23 ENCOUNTER — Telehealth: Payer: Self-pay

## 2021-08-23 VITALS — BP 159/95 | HR 76 | Resp 17 | Ht 63.0 in | Wt 164.0 lb

## 2021-08-23 DIAGNOSIS — I10 Essential (primary) hypertension: Secondary | ICD-10-CM | POA: Diagnosis not present

## 2021-08-23 DIAGNOSIS — I6529 Occlusion and stenosis of unspecified carotid artery: Secondary | ICD-10-CM | POA: Insufficient documentation

## 2021-08-23 DIAGNOSIS — E785 Hyperlipidemia, unspecified: Secondary | ICD-10-CM | POA: Diagnosis not present

## 2021-08-23 DIAGNOSIS — I639 Cerebral infarction, unspecified: Secondary | ICD-10-CM

## 2021-08-23 DIAGNOSIS — I6523 Occlusion and stenosis of bilateral carotid arteries: Secondary | ICD-10-CM

## 2021-08-23 DIAGNOSIS — I739 Peripheral vascular disease, unspecified: Secondary | ICD-10-CM | POA: Diagnosis not present

## 2021-08-23 NOTE — Telephone Encounter (Addendum)
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  Referral placed for percutaneous treatment.  ?Message sent to Vickie '@[email or web address]'$  Radiology, she will contact patient to schedule.  ? ? ? ? ?

## 2021-08-23 NOTE — Assessment & Plan Note (Signed)
Carotid duplex today reveals mild, 1 to 39% ICA stenosis bilaterally.  Very mild disease with no increased risk of stroke.  This can be checked every other year with duplex. ?
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
## 2021-08-23 NOTE — Assessment & Plan Note (Signed)
Recently checked and stable.  To be checked again next year. ?

## 2021-08-26 ENCOUNTER — Other Ambulatory Visit: Payer: Self-pay | Admitting: Urology

## 2021-08-26 DIAGNOSIS — N2889 Other specified disorders of kidney and ureter: Secondary | ICD-10-CM

## 2021-08-27 ENCOUNTER — Ambulatory Visit
Admission: RE | Admit: 2021-08-27 | Discharge: 2021-08-27 | Disposition: A | Discharge status: Home / Self Care | Payer: 59 | Source: Ambulatory Visit | Attending: Urology | Admitting: Urology

## 2021-08-27 DIAGNOSIS — N2889 Other specified disorders of kidney and ureter: Secondary | ICD-10-CM | POA: Diagnosis not present

## 2021-08-27 HISTORY — PX: IR RADIOLOGIST EVAL & MGMT: IMG5224
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
## 2021-08-29 ENCOUNTER — Other Ambulatory Visit (HOSPITAL_COMMUNITY): Payer: Self-pay | Admitting: Interventional Radiology

## 2021-08-29 DIAGNOSIS — N289 Disorder of kidney and ureter, unspecified: Secondary | ICD-10-CM

## 2021-09-05 ENCOUNTER — Encounter: Payer: Self-pay | Admitting: Nurse Practitioner

## 2021-09-10 ENCOUNTER — Ambulatory Visit (INDEPENDENT_AMBULATORY_CARE_PROVIDER_SITE_OTHER): Payer: 59 | Admitting: Internal Medicine

## 2021-09-10 ENCOUNTER — Encounter: Payer: Self-pay | Admitting: Internal Medicine

## 2021-09-10 VITALS — BP 151/90 | HR 84 | Ht 63.0 in | Wt 165.6 lb

## 2021-09-10 DIAGNOSIS — I2510 Atherosclerotic heart disease of native coronary artery without angina pectoris: Secondary | ICD-10-CM

## 2021-09-10 DIAGNOSIS — I10 Essential (primary) hypertension: Secondary | ICD-10-CM | POA: Diagnosis not present

## 2021-09-10 DIAGNOSIS — J410 Simple chronic bronchitis: Secondary | ICD-10-CM

## 2021-09-10 DIAGNOSIS — I639 Cerebral infarction, unspecified: Secondary | ICD-10-CM

## 2021-09-10 DIAGNOSIS — G5603 Carpal tunnel syndrome, bilateral upper limbs: Secondary | ICD-10-CM | POA: Diagnosis not present

## 2021-09-10 DIAGNOSIS — N1831 Chronic kidney disease, stage 3a: Secondary | ICD-10-CM | POA: Diagnosis not present

## 2021-09-10 DIAGNOSIS — F32A Depression, unspecified: Secondary | ICD-10-CM

## 2021-09-10 DIAGNOSIS — K2930 Chronic superficial gastritis without bleeding: Secondary | ICD-10-CM

## 2021-09-10 DIAGNOSIS — F419 Anxiety disorder, unspecified: Secondary | ICD-10-CM

## 2021-09-10 DIAGNOSIS — R69 Illness, unspecified: Secondary | ICD-10-CM | POA: Diagnosis not present

## 2021-09-10 MED ORDER — BUPROPION HCL ER (XL) 300 MG PO TB24: 300.0000 mg | ORAL_TABLET | Freq: Every day | ORAL | 3 refills | Status: DC

## 2021-09-10 MED ORDER — AMLODIPINE BESYLATE 5 MG PO TABS: 5.0000 mg | ORAL_TABLET | Freq: Every day | ORAL | 3 refills | Status: DC

## 2021-09-10 MED ORDER — DIAZEPAM 5 MG PO TABS: 5.0000 mg | ORAL_TABLET | Freq: Two times a day (BID) | ORAL | 0 refills | Status: DC

## 2021-09-10 NOTE — Assessment & Plan Note (Signed)

## 2021-09-10 NOTE — Assessment & Plan Note (Signed)
Numbness is under control

## 2021-09-10 NOTE — Assessment & Plan Note (Signed)
-   Patient experiencing high levels of anxiety.  - Encouraged patient to engage in relaxing activities like yoga, meditation, journaling, going for a walk, or participating in a hobby.  - Encouraged patient to reach out to trusted friends or family members about recent struggles, Patient was advised to read A book, how to stop worrying and start living, it is good book to read to control  the stress  

## 2021-09-10 NOTE — Assessment & Plan Note (Signed)
Patient denies any chest pain heart is regular

## 2021-09-10 NOTE — Assessment & Plan Note (Signed)
Patient denies any chest pain or shortness of breath there is no history of palpitation or paroxysmal nocturnal dyspnea   patient was advised to follow low-salt low-cholesterol diet    ideally I want to keep systolic blood pressure below 130 mmHg, patient was asked to check blood pressure one times a week and give me a report on that.  Patient will be follow-up in 3 months  or earlier as needed, patient will call me back for any change in the cardiovascular symptoms Patient was advised to buy a book from local bookstore concerning blood pressure and read several chapters  every day.  This will be supplemented by some of the material we will give him from the office.  Patient should also utilize other resources like YouTube and Internet to learn more about the blood pressure and the diet.  Patient was started on amlodipine 5 mg p.o. daily
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
## 2021-09-10 NOTE — Assessment & Plan Note (Signed)
Stable at the present time. 

## 2021-09-10 NOTE — Assessment & Plan Note (Signed)
Patient does not smoke anymore ?

## 2021-09-10 NOTE — Assessment & Plan Note (Signed)
-   The patient's GERD is stable on medication.  - Instructed the patient to avoid eating spicy and acidic foods, as well as foods high in fat. - Instructed the patient to avoid eating large meals or meals 2-3 hours prior to sleeping. 

## 2021-09-20 ENCOUNTER — Other Ambulatory Visit: Payer: Self-pay | Admitting: Radiology

## 2021-09-20 DIAGNOSIS — N2889 Other specified disorders of kidney and ureter: Secondary | ICD-10-CM

## 2021-09-27 ENCOUNTER — Encounter (HOSPITAL_COMMUNITY)
Admission: RE | Admit: 2021-09-27 | Discharge: 2021-09-27 | Disposition: A | Discharge status: Home / Self Care | Payer: 59 | Source: Ambulatory Visit | Attending: Interventional Radiology | Admitting: Interventional Radiology

## 2021-09-27 ENCOUNTER — Encounter (HOSPITAL_COMMUNITY): Payer: Self-pay

## 2021-09-27 ENCOUNTER — Ambulatory Visit (HOSPITAL_COMMUNITY)
Admission: RE | Admit: 2021-09-27 | Discharge: 2021-09-27 | Disposition: A | Discharge status: Home / Self Care | Payer: 59 | Source: Ambulatory Visit | Attending: Radiology | Admitting: Radiology

## 2021-09-27 ENCOUNTER — Other Ambulatory Visit: Payer: Self-pay

## 2021-09-27 DIAGNOSIS — N2889 Other specified disorders of kidney and ureter: Secondary | ICD-10-CM

## 2021-09-27 DIAGNOSIS — Z01818 Encounter for other preprocedural examination: Secondary | ICD-10-CM | POA: Insufficient documentation

## 2021-09-27 LAB — CBC WITH DIFFERENTIAL/PLATELET
Abs Immature Granulocytes: 0.08 10*3/uL — ABNORMAL HIGH (ref 0.00–0.07)
Basophils Absolute: 0.1 10*3/uL (ref 0.0–0.1)
Basophils Relative: 1 %
Eosinophils Absolute: 0.3 10*3/uL (ref 0.0–0.5)
Eosinophils Relative: 4 %
HCT: 43.6 % (ref 36.0–46.0)
Hemoglobin: 14.6 g/dL (ref 12.0–15.0)
Immature Granulocytes: 1 %
Lymphocytes Relative: 26 %
Lymphs Abs: 2.1 10*3/uL (ref 0.7–4.0)
MCH: 31.3 pg (ref 26.0–34.0)
MCHC: 33.5 g/dL (ref 30.0–36.0)
MCV: 93.4 fL (ref 80.0–100.0)
Monocytes Absolute: 0.5 10*3/uL (ref 0.1–1.0)
Monocytes Relative: 6 %
Neutro Abs: 5 10*3/uL (ref 1.7–7.7)
Neutrophils Relative %: 62 %
Platelets: 279 10*3/uL (ref 150–400)
RBC: 4.67 MIL/uL (ref 3.87–5.11)
RDW: 12.1 % (ref 11.5–15.5)
WBC: 8 10*3/uL (ref 4.0–10.5)
nRBC: 0 % (ref 0.0–0.2)

## 2021-09-27 LAB — BASIC METABOLIC PANEL
Anion gap: 9 (ref 5–15)
BUN: 25 mg/dL — ABNORMAL HIGH (ref 6–20)
CO2: 24 mmol/L (ref 22–32)
Calcium: 9.6 mg/dL (ref 8.9–10.3)
Chloride: 106 mmol/L (ref 98–111)
Creatinine, Ser: 1.24 mg/dL — ABNORMAL HIGH (ref 0.44–1.00)
GFR, Estimated: 50 mL/min — ABNORMAL LOW (ref >60)
Glucose, Bld: 119 mg/dL — ABNORMAL HIGH (ref 70–99)
Potassium: 4.5 mmol/L (ref 3.5–5.1)
Sodium: 139 mmol/L (ref 135–145)

## 2021-09-27 LAB — PROTIME-INR
INR: 1 (ref 0.8–1.2)
Prothrombin Time: 13 seconds (ref 11.4–15.2)

## 2021-09-27 NOTE — Progress Notes (Signed)
Anesthesia note:  Bowel prep reminder:no  PCP - Dr. Kendall Flack Cardiologist -Dr. Clayborn Bigness Other-   Chest x-ray - no EKG - 06/26/21-epic Stress Test - no ECHO - 2018-epic Cardiac Cath - Peripheral Vasc. Cath with stents 07/26/20  Pacemaker/ICD device last checked:NA  Sleep Study - yes will retest after this surgery CPAP - no not at this time  Pt is pre diabetic-NA Fasting Blood Sugar -  Checks Blood Sugar _____  Blood Thinner:Plavix/ Dr. Clayborn Bigness Blood Thinner Instructions:Stop both on 09/26/21 Aspirin Instructions:ASA Last Dose:09/26/21  Anesthesia review: yes  Patient denies shortness of breath, fever, cough and chest pain at PAT appointment Pt is anxious and having a hard time emotionally due to a recent sudden death in the family.  Patient verbalized understanding of instructions that were given to them at the PAT appointment. Patient was also instructed that they will need to review over the PAT instructions again at home before surgery. Pt was with her son

## 2021-09-27 NOTE — Patient Instructions (Addendum)
DUE TO COVID-19 ONLY TWO VISITORS  (aged 60 and older)  ARE ALLOWED TO COME WITH YOU AND STAY IN THE WAITING ROOM ONLY DURING PRE OP AND PROCEDURE.   **NO VISITORS ARE ALLOWED IN THE SHORT STAY AREA OR RECOVERY ROOM!!**  IF YOU WILL BE ADMITTED INTO THE HOSPITAL YOU ARE ALLOWED ONLY FOUR SUPPORT PEOPLE DURING VISITATION HOURS ONLY (7 AM -8PM)   The support person(s) must pass our screening, gel in and out, and wear a mask at all times, including in the patient's room. Patients must also wear a mask when staff or their support person are in the room. Visitors GUEST BADGE MUST BE WORN VISIBLY  One adult visitor may remain with you overnight and MUST be in the room by 8 P.M.     Your procedure is scheduled on: 10/02/21   Report to [facility] Main Entrance    Report to admitting at   6:30 AM   Call this number if you have problems the morning of surgery [phone]   Do not eat food or drink:After Midnight.                       If you have questions, please contact your surgeon's office.     Oral Hygiene is also important to reduce your risk of infection.                                    Remember - BRUSH YOUR TEETH THE MORNING OF SURGERY WITH YOUR REGULAR TOOTHPASTE    Take these medicines the morning of surgery with A SIP OF WATER: Bupropion, Amlodipine, Pantoprazole, Use you inhalers and bring then with you   Before surgery.Stop taking _Plavix__________on __6/8________as instructed by _____________.  Stop taking _ASA___________as directed by your Surgeon/Cardiologist.  Contact your Surgeon/Cardiologist for instructions on Anticoagulant Therapy prior to surgery.   DO NOT TAKE ANY ORAL DIABETIC MEDICATIONS DAY OF YOUR SURGERY .                              You may not have any metal on your body including hair pins, jewelry, and body piercing             Do not wear make-up, lotions, powders, perfumes/cologne, or deodorant  Do not wear nail polish including gel  and S&S, artificial/acrylic nails, or any other type of covering on natural nails including finger and toenails. If you have artificial nails, gel coating, etc. that needs to be removed by a nail salon please have this removed prior to surgery or surgery may need to be canceled/ delayed if the surgeon/ anesthesia feels like they are unable to be safely monitored.   Do not shave  48 hours prior to surgery.                  Do not bring valuables to the hospital. [place].   Contacts, dentures or bridgework may not be worn into surgery.   Bring small overnight bag day of surgery.   DO NOT [place]. PHARMACY WILL DISPENSE MEDICATIONS LISTED ON YOUR MEDICATION LIST TO YOU DURING YOUR ADMISSION [place]!    Patients discharged on the day  of surgery will not be allowed to drive home.  Someone NEEDS to stay with you for the first 24 hours after anesthesia.                 Please read over the following fact sheets you were given: IF YOU HAVE QUESTIONS ABOUT YOUR PRE-OP INSTRUCTIONS PLEASE CALL [phone]     [facility] - Preparing for Surgery Before surgery, you can play an important role.  Because skin is not sterile, your skin needs to be as free of germs as possible.  You can reduce the number of germs on your skin by washing with CHG (chlorahexidine gluconate) soap before surgery.  CHG is an antiseptic cleaner which kills germs and bonds with the skin to continue killing germs even after washing. Please DO NOT use if you have an allergy to CHG or antibacterial soaps.  If your skin becomes reddened/irritated stop using the CHG and inform your nurse when you arrive at Short Stay. Do not shave (including legs and underarms) for at least 48 hours prior to the first CHG shower.   Please follow these instructions carefully:  1.  Shower with CHG Soap the night before surgery and the  morning of  Surgery.  2.  If you choose to wash your hair, wash your hair first as usual with your  normal  shampoo.  3.  After you shampoo, rinse your hair and body thoroughly to remove the  shampoo.                            4.  Use CHG as you would any other liquid soap.  You can apply chg directly  to the skin and wash                       Gently with a scrungie or clean washcloth.  5.  Apply the CHG Soap to your body ONLY FROM THE NECK DOWN.   Do not use on face/ open                           Wound or open sores. Avoid contact with eyes, ears mouth and genitals (private parts).                       Wash face,  Genitals (private parts) with your normal soap.             6.  Wash thoroughly, paying special attention to the area where your surgery  will be performed.  7.  Thoroughly rinse your body with warm water from the neck down.  8.  DO NOT shower/wash with your normal soap after using and rinsing off  the CHG Soap.                9.  Pat yourself dry with a clean towel.            10.  Wear clean pajamas.            11.  Place clean sheets on your bed the night of your first shower and do not  sleep with pets. Day of Surgery : Do not apply any lotions/deodorants the morning of surgery.  Please wear clean clothes to the hospital/surgery center.  FAILURE TO FOLLOW THESE INSTRUCTIONS MAY RESULT IN THE CANCELLATION OF YOUR SURGERY PATIENT SIGNATURE_________________________________  NURSE  SIGNATURE__________________________________  ________________________________________________________________________

## 2021-10-01 ENCOUNTER — Other Ambulatory Visit: Payer: Self-pay | Admitting: Radiology

## 2021-10-01 DIAGNOSIS — N2889 Other specified disorders of kidney and ureter: Secondary | ICD-10-CM
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
## 2021-10-02 ENCOUNTER — Other Ambulatory Visit: Payer: Self-pay

## 2021-10-02 ENCOUNTER — Ambulatory Visit (HOSPITAL_COMMUNITY): Payer: 59 | Admitting: Physician Assistant

## 2021-10-02 ENCOUNTER — Ambulatory Visit (HOSPITAL_COMMUNITY)
Admission: RE | Admit: 2021-10-02 | Discharge: 2021-10-02 | Disposition: A | Discharge status: Home / Self Care | Payer: 59 | Source: Ambulatory Visit | Attending: Interventional Radiology | Admitting: Interventional Radiology

## 2021-10-02 ENCOUNTER — Encounter (HOSPITAL_COMMUNITY)
Admission: RE | Disposition: A | Discharge status: Home / Self Care | Payer: Self-pay | Source: Home / Self Care | Attending: Interventional Radiology

## 2021-10-02 ENCOUNTER — Encounter (HOSPITAL_COMMUNITY): Payer: Self-pay

## 2021-10-02 ENCOUNTER — Ambulatory Visit (HOSPITAL_BASED_OUTPATIENT_CLINIC_OR_DEPARTMENT_OTHER): Payer: 59 | Admitting: Certified Registered Nurse Anesthetist

## 2021-10-02 ENCOUNTER — Ambulatory Visit (HOSPITAL_COMMUNITY)
Admission: RE | Admit: 2021-10-02 | Discharge: 2021-10-02 | Disposition: A | Discharge status: Home / Self Care | Payer: 59 | Attending: Interventional Radiology | Admitting: Interventional Radiology

## 2021-10-02 DIAGNOSIS — Z87891 Personal history of nicotine dependence: Secondary | ICD-10-CM | POA: Diagnosis not present

## 2021-10-02 DIAGNOSIS — Z539 Procedure and treatment not carried out, unspecified reason: Secondary | ICD-10-CM | POA: Diagnosis not present

## 2021-10-02 DIAGNOSIS — I252 Old myocardial infarction: Secondary | ICD-10-CM | POA: Diagnosis not present

## 2021-10-02 DIAGNOSIS — I10 Essential (primary) hypertension: Secondary | ICD-10-CM

## 2021-10-02 DIAGNOSIS — N2889 Other specified disorders of kidney and ureter: Secondary | ICD-10-CM

## 2021-10-02 DIAGNOSIS — I2510 Atherosclerotic heart disease of native coronary artery without angina pectoris: Secondary | ICD-10-CM | POA: Insufficient documentation

## 2021-10-02 DIAGNOSIS — Q631 Lobulated, fused and horseshoe kidney: Secondary | ICD-10-CM | POA: Diagnosis not present

## 2021-10-02 DIAGNOSIS — K219 Gastro-esophageal reflux disease without esophagitis: Secondary | ICD-10-CM | POA: Diagnosis not present

## 2021-10-02 DIAGNOSIS — N289 Disorder of kidney and ureter, unspecified: Secondary | ICD-10-CM | POA: Diagnosis present

## 2021-10-02 DIAGNOSIS — J449 Chronic obstructive pulmonary disease, unspecified: Secondary | ICD-10-CM | POA: Diagnosis not present

## 2021-10-02 HISTORY — PX: RADIOLOGY WITH ANESTHESIA: SHX6223

## 2021-10-02 LAB — BASIC METABOLIC PANEL
Anion gap: 7 (ref 5–15)
BUN: 23 mg/dL — ABNORMAL HIGH (ref 6–20)
CO2: 25 mmol/L (ref 22–32)
Calcium: 9.6 mg/dL (ref 8.9–10.3)
Chloride: 110 mmol/L (ref 98–111)
Creatinine, Ser: 1.03 mg/dL — ABNORMAL HIGH (ref 0.44–1.00)
GFR, Estimated: >60 mL/min (ref >60)
Glucose, Bld: 117 mg/dL — ABNORMAL HIGH (ref 70–99)
Potassium: 3.9 mmol/L (ref 3.5–5.1)
Sodium: 142 mmol/L (ref 135–145)

## 2021-10-02 LAB — TYPE AND SCREEN
ABO/RH(D): AB NEG
Antibody Screen: NEGATIVE

## 2021-10-02 LAB — ABO/RH: ABO/RH(D): AB NEG

## 2021-10-02 SURGERY — CT WITH ANESTHESIA
Anesthesia: General | Laterality: Right

## 2021-10-02 MED ORDER — LIDOCAINE 2% (20 MG/ML) 5 ML SYRINGE
INTRAMUSCULAR | Status: DC | PRN
  Administered 2021-10-02: 100 mg via INTRAVENOUS

## 2021-10-02 MED ORDER — CEFAZOLIN SODIUM-DEXTROSE 2-4 GM/100ML-% IV SOLN
INTRAVENOUS | Status: AC
  Filled 2021-10-02: qty 100

## 2021-10-02 MED ORDER — CHLORHEXIDINE GLUCONATE 0.12 % MT SOLN
15.0000 mL | Freq: Once | OROMUCOSAL | Status: AC
  Administered 2021-10-02: 15 mL via OROMUCOSAL

## 2021-10-02 MED ORDER — MIDAZOLAM HCL 2 MG/2ML IJ SOLN
INTRAMUSCULAR | Status: AC
  Filled 2021-10-02: qty 2

## 2021-10-02 MED ORDER — ACETAMINOPHEN 10 MG/ML IV SOLN: 1000.0000 mg | Freq: Once | INTRAVENOUS | Status: DC | PRN

## 2021-10-02 MED ORDER — IOHEXOL 300 MG/ML  SOLN
100.0000 mL | Freq: Once | INTRAMUSCULAR | Status: AC | PRN
  Administered 2021-10-02: 100 mL via INTRAVENOUS

## 2021-10-02 MED ORDER — ONDANSETRON HCL 4 MG/2ML IJ SOLN
4.0000 mg | Freq: Once | INTRAMUSCULAR | Status: AC | PRN
  Administered 2021-10-02: 4 mg via INTRAVENOUS

## 2021-10-02 MED ORDER — SODIUM CHLORIDE (PF) 0.9 % IJ SOLN
INTRAMUSCULAR | Status: AC
  Filled 2021-10-02: qty 50

## 2021-10-02 MED ORDER — LACTATED RINGERS IV SOLN
INTRAVENOUS | Status: DC
  Administered 2021-10-02: 10 mL/h via INTRAVENOUS

## 2021-10-02 MED ORDER — SUGAMMADEX SODIUM 200 MG/2ML IV SOLN
INTRAVENOUS | Status: DC | PRN
  Administered 2021-10-02: 200 mg via INTRAVENOUS

## 2021-10-02 MED ORDER — OXYCODONE HCL 5 MG/5ML PO SOLN: 5.0000 mg | Freq: Once | ORAL | Status: DC | PRN

## 2021-10-02 MED ORDER — OXYCODONE HCL 5 MG PO TABS: 5.0000 mg | ORAL_TABLET | Freq: Once | ORAL | Status: DC | PRN

## 2021-10-02 MED ORDER — ROCURONIUM BROMIDE 10 MG/ML (PF) SYRINGE
PREFILLED_SYRINGE | INTRAVENOUS | Status: DC | PRN
  Administered 2021-10-02: 70 mg via INTRAVENOUS

## 2021-10-02 MED ORDER — FENTANYL CITRATE (PF) 100 MCG/2ML IJ SOLN
INTRAMUSCULAR | Status: DC | PRN
  Administered 2021-10-02: 100 ug via INTRAVENOUS

## 2021-10-02 MED ORDER — DEXAMETHASONE SODIUM PHOSPHATE 10 MG/ML IJ SOLN
INTRAMUSCULAR | Status: DC | PRN
  Administered 2021-10-02: 10 mg via INTRAVENOUS

## 2021-10-02 MED ORDER — HYDROMORPHONE HCL 1 MG/ML IJ SOLN: 0.2500 mg | INTRAMUSCULAR | Status: DC | PRN

## 2021-10-02 MED ORDER — MIDAZOLAM HCL 5 MG/5ML IJ SOLN
INTRAMUSCULAR | Status: DC | PRN
  Administered 2021-10-02: 2 mg via INTRAVENOUS

## 2021-10-02 MED ORDER — PHENYLEPHRINE 80 MCG/ML (10ML) SYRINGE FOR IV PUSH (FOR BLOOD PRESSURE SUPPORT)
PREFILLED_SYRINGE | INTRAVENOUS | Status: DC | PRN
  Administered 2021-10-02: 160 ug via INTRAVENOUS
  Administered 2021-10-02: 240 ug via INTRAVENOUS
  Administered 2021-10-02: 160 ug via INTRAVENOUS

## 2021-10-02 MED ORDER — AMISULPRIDE (ANTIEMETIC) 5 MG/2ML IV SOLN
10.0000 mg | Freq: Once | INTRAVENOUS | Status: AC | PRN
  Administered 2021-10-02: 10 mg via INTRAVENOUS

## 2021-10-02 MED ORDER — PHENYLEPHRINE HCL-NACL 20-0.9 MG/250ML-% IV SOLN
INTRAVENOUS | Status: DC | PRN
  Administered 2021-10-02: 40 ug/min via INTRAVENOUS

## 2021-10-02 MED ORDER — FENTANYL CITRATE (PF) 250 MCG/5ML IJ SOLN
INTRAMUSCULAR | Status: AC
  Filled 2021-10-02: qty 5

## 2021-10-02 MED ORDER — ORAL CARE MOUTH RINSE: 15.0000 mL | Freq: Once | OROMUCOSAL | Status: AC

## 2021-10-02 MED ORDER — AMISULPRIDE (ANTIEMETIC) 5 MG/2ML IV SOLN
INTRAVENOUS | Status: AC
  Filled 2021-10-02: qty 4

## 2021-10-02 MED ORDER — PROPOFOL 10 MG/ML IV BOLUS
INTRAVENOUS | Status: DC | PRN
  Administered 2021-10-02: 150 mg via INTRAVENOUS

## 2021-10-02 MED ORDER — SODIUM CHLORIDE 0.9 % IV SOLN
INTRAVENOUS | Status: AC
  Filled 2021-10-02: qty 1000

## 2021-10-02 MED ORDER — LACTATED RINGERS IV SOLN: INTRAVENOUS | Status: DC

## 2021-10-02 MED ORDER — EPHEDRINE SULFATE-NACL 50-0.9 MG/10ML-% IV SOSY
PREFILLED_SYRINGE | INTRAVENOUS | Status: DC | PRN
  Administered 2021-10-02: 5 mg via INTRAVENOUS

## 2021-10-02 NOTE — Procedures (Signed)
Interventional Radiology Procedure Note  Procedure: Aborted CT guided biopsy and MWA, renal mass no longer clearly present.   Complications: None  Estimated Blood Loss: None  Recommendations: - Will DC home - F/U imaging in 3 months   Signed,  Criselda Peaches, MD

## 2021-10-02 NOTE — Sedation Documentation (Signed)
Anesthesia in to sedate and monitor. 
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
## 2021-10-02 NOTE — Sedation Documentation (Signed)
Case aborted per Dr Laurence Ferrari.

## 2021-10-02 NOTE — Transfer of Care (Signed)
Immediate Anesthesia Transfer of Care Note  Patient: [name]  Procedure(s) Performed: CT MICROWAVE ABLATION (Right)  Patient Location: PACU  Anesthesia Type:General  Level of Consciousness: awake, alert  and oriented  Airway & Oxygen Therapy: Spontaneous breathing and via simple face mask  Post-op Assessment: Report given to RN and Post -op Vital signs reviewed and stable  Post vital signs: Reviewed and stable  Last Vitals:  Vitals Value Taken Time  BP 133/73 10/02/21 0951  Temp    Pulse 93 10/02/21 0956  Resp 19 10/02/21 0956  SpO2 96 % 10/02/21 0956  Vitals shown include unvalidated device data.  Last Pain:  Vitals:   10/02/21 0652  TempSrc: Oral         Complications: No notable events documented.

## 2021-10-02 NOTE — Anesthesia Procedure Notes (Signed)
Procedure Name: Intubation Date/Time: 10/02/2021 8:25 AM  Performed by: Montel Clock, CRNAPre-anesthesia Checklist: Patient identified, Emergency Drugs available, Suction available, Patient being monitored and Timeout performed Patient Re-evaluated:Patient Re-evaluated prior to induction Oxygen Delivery Method: Circle system utilized Preoxygenation: Pre-oxygenation with 100% oxygen Induction Type: IV induction Ventilation: Mask ventilation without difficulty Laryngoscope Size: Miller and 2 Grade View: Grade I Tube type: Oral Tube size: 7.0 mm Number of attempts: 1 Airway Equipment and Method: Stylet Placement Confirmation: ETT inserted through vocal cords under direct vision, positive ETCO2 and breath sounds checked- equal and bilateral Secured at: 21 cm Tube secured with: Tape Dental Injury: Teeth and Oropharynx as per pre-operative assessment

## 2021-10-02 NOTE — Anesthesia Postprocedure Evaluation (Signed)
Anesthesia Post Note  Patient: [name]  Procedure(s) Performed: CT MICROWAVE ABLATION (Right)     Patient location during evaluation: PACU Anesthesia Type: General Level of consciousness: awake and alert Pain management: pain level controlled Vital Signs Assessment: post-procedure vital signs reviewed and stable Respiratory status: spontaneous breathing, nonlabored ventilation, respiratory function stable and patient connected to nasal cannula oxygen Cardiovascular status: blood pressure returned to baseline and stable Postop Assessment: no apparent nausea or vomiting Anesthetic complications: no   No notable events documented.  Last Vitals:  Vitals:   10/02/21 1100 10/02/21 1115  BP: (!) 161/84 (!) 161/88  Pulse: 65 69  Resp:  13  Temp:    SpO2: 99% 100%    Last Pain:  Vitals:   10/02/21 1115  TempSrc:   PainSc: 0-No pain                 Barnet Glasgow

## 2021-10-03 ENCOUNTER — Encounter (HOSPITAL_COMMUNITY): Payer: Self-pay | Admitting: Interventional Radiology

## 2021-10-16 ENCOUNTER — Encounter: Payer: Self-pay | Admitting: Nurse Practitioner

## 2021-10-18 ENCOUNTER — Encounter: Payer: Self-pay | Admitting: Nurse Practitioner

## 2021-10-18 ENCOUNTER — Ambulatory Visit
Admission: RE | Admit: 2021-10-18 | Discharge: 2021-10-18 | Disposition: A | Discharge status: Home / Self Care | Payer: 59 | Attending: Nurse Practitioner | Admitting: Nurse Practitioner

## 2021-10-18 ENCOUNTER — Ambulatory Visit (INDEPENDENT_AMBULATORY_CARE_PROVIDER_SITE_OTHER): Payer: 59 | Admitting: Nurse Practitioner

## 2021-10-18 ENCOUNTER — Ambulatory Visit
Admission: RE | Admit: 2021-10-18 | Discharge: 2021-10-18 | Disposition: A | Discharge status: Home / Self Care | Payer: 59 | Source: Ambulatory Visit | Attending: Nurse Practitioner | Admitting: Nurse Practitioner

## 2021-10-18 VITALS — BP 110/72 | HR 72 | Ht 62.0 in | Wt 162.1 lb

## 2021-10-18 DIAGNOSIS — R058 Other specified cough: Secondary | ICD-10-CM

## 2021-10-18 DIAGNOSIS — R059 Cough, unspecified: Secondary | ICD-10-CM | POA: Insufficient documentation

## 2021-10-18 DIAGNOSIS — R5383 Other fatigue: Secondary | ICD-10-CM | POA: Diagnosis not present

## 2021-10-18 MED ORDER — BENZONATATE 100 MG PO CAPS: 100.0000 mg | ORAL_CAPSULE | Freq: Three times a day (TID) | ORAL | 0 refills | Status: DC | PRN
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
## 2021-10-18 NOTE — Assessment & Plan Note (Addendum)
Patient lungs are clear on auscultation. Continue the antibiotic therapy doxycycline and predisoime. Advised pt to take palin muninex OTC. Started on benzonate perls Sent for chest Xray

## 2021-10-28 ENCOUNTER — Encounter: Payer: Self-pay | Admitting: Nurse Practitioner

## 2021-10-28 ENCOUNTER — Other Ambulatory Visit: Payer: Self-pay | Admitting: *Deleted

## 2021-10-28 MED ORDER — AMOXICILLIN 500 MG PO TABS
500.0000 mg | ORAL_TABLET | Freq: Two times a day (BID) | ORAL | 0 refills | Status: DC
Start: 2021-10-28 — End: 2021-12-09

## 2021-10-28 MED ORDER — PREDNISONE 20 MG PO TABS: 20.0000 mg | ORAL_TABLET | Freq: Every day | ORAL | 0 refills | Status: DC

## 2021-11-08 ENCOUNTER — Other Ambulatory Visit: Payer: Self-pay | Admitting: Nurse Practitioner

## 2021-11-20 ENCOUNTER — Ambulatory Visit (INDEPENDENT_AMBULATORY_CARE_PROVIDER_SITE_OTHER): Payer: 59 | Admitting: Internal Medicine

## 2021-11-20 ENCOUNTER — Encounter: Payer: Self-pay | Admitting: Internal Medicine

## 2021-11-20 ENCOUNTER — Ambulatory Visit: Payer: 59 | Admitting: Internal Medicine

## 2021-11-20 VITALS — BP 132/84 | HR 86 | Ht 62.0 in | Wt 166.1 lb

## 2021-11-20 DIAGNOSIS — K219 Gastro-esophageal reflux disease without esophagitis: Secondary | ICD-10-CM | POA: Diagnosis not present

## 2021-11-20 DIAGNOSIS — R200 Anesthesia of skin: Secondary | ICD-10-CM

## 2021-11-20 DIAGNOSIS — I2510 Atherosclerotic heart disease of native coronary artery without angina pectoris: Secondary | ICD-10-CM | POA: Diagnosis not present

## 2021-11-20 DIAGNOSIS — Z1389 Encounter for screening for other disorder: Secondary | ICD-10-CM

## 2021-11-20 DIAGNOSIS — I639 Cerebral infarction, unspecified: Secondary | ICD-10-CM

## 2021-11-20 DIAGNOSIS — Z1211 Encounter for screening for malignant neoplasm of colon: Secondary | ICD-10-CM

## 2021-11-20 DIAGNOSIS — J42 Unspecified chronic bronchitis: Secondary | ICD-10-CM | POA: Diagnosis not present

## 2021-11-20 DIAGNOSIS — F32A Depression, unspecified: Secondary | ICD-10-CM

## 2021-11-20 DIAGNOSIS — I739 Peripheral vascular disease, unspecified: Secondary | ICD-10-CM | POA: Diagnosis not present

## 2021-11-20 DIAGNOSIS — I420 Dilated cardiomyopathy: Secondary | ICD-10-CM

## 2021-11-20 DIAGNOSIS — F419 Anxiety disorder, unspecified: Secondary | ICD-10-CM | POA: Diagnosis not present

## 2021-11-20 DIAGNOSIS — R69 Illness, unspecified: Secondary | ICD-10-CM | POA: Diagnosis not present

## 2021-11-20 DIAGNOSIS — I10 Essential (primary) hypertension: Secondary | ICD-10-CM | POA: Diagnosis not present

## 2021-11-20 DIAGNOSIS — R0602 Shortness of breath: Secondary | ICD-10-CM | POA: Diagnosis not present

## 2021-11-20 MED ORDER — ALBUTEROL SULFATE HFA 108 (90 BASE) MCG/ACT IN AERS: 2.0000 | INHALATION_SPRAY | Freq: Four times a day (QID) | RESPIRATORY_TRACT | 3 refills | Status: DC | PRN

## 2021-11-20 MED ORDER — ONDANSETRON 4 MG PO TBDP: 4.0000 mg | ORAL_TABLET | Freq: Three times a day (TID) | ORAL | 6 refills | Status: DC | PRN

## 2021-11-20 MED ORDER — DIAZEPAM 5 MG PO TABS: 5.0000 mg | ORAL_TABLET | Freq: Two times a day (BID) | ORAL | 0 refills | Status: DC

## 2021-11-20 MED ORDER — PANTOPRAZOLE SODIUM 40 MG PO TBEC: DELAYED_RELEASE_TABLET | ORAL | 3 refills | Status: DC

## 2021-11-20 MED ORDER — BUPROPION HCL ER (XL) 300 MG PO TB24: 300.0000 mg | ORAL_TABLET | Freq: Every day | ORAL | 3 refills | Status: DC

## 2021-11-20 MED ORDER — FLUTICASONE-SALMETEROL 100-50 MCG/ACT IN AEPB
1.0000 | INHALATION_SPRAY | Freq: Every morning | RESPIRATORY_TRACT | 6 refills | Status: DC
Start: 2021-11-20 — End: 2021-12-05

## 2021-11-20 MED ORDER — CLOPIDOGREL BISULFATE 75 MG PO TABS: 75.0000 mg | ORAL_TABLET | Freq: Every day | ORAL | 3 refills | Status: DC

## 2021-11-20 NOTE — Assessment & Plan Note (Signed)
Refer to Ortho?
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
## 2021-11-20 NOTE — Assessment & Plan Note (Signed)
Patient is being followed up by vascular specialist

## 2021-11-20 NOTE — Assessment & Plan Note (Signed)
We will do an echocardiogram

## 2021-11-20 NOTE — Assessment & Plan Note (Signed)
Refer to pulmonologist 

## 2021-11-20 NOTE — Assessment & Plan Note (Signed)
-   Patient experiencing high levels of anxiety.  - Encouraged patient to engage in relaxing activities like yoga, meditation, journaling, going for a walk, or participating in a hobby.  - Encouraged patient to reach out to trusted friends or family members about recent struggles, Patient was advised to read A book, how to stop worrying and start living, it is good book to read to control  the stress  

## 2021-11-20 NOTE — Assessment & Plan Note (Signed)

## 2021-11-20 NOTE — Assessment & Plan Note (Signed)
Patient was advised to continue using Plavix and aspirin

## 2021-11-20 NOTE — Addendum Note (Signed)
Addended by: Alois Cliche on: 11/20/2021 12:51 PM   Modules accepted: Orders

## 2021-11-20 NOTE — Assessment & Plan Note (Signed)
-   The patient's GERD is stable on medication.  - Instructed the patient to avoid eating spicy and acidic foods, as well as foods high in fat. - Instructed the patient to avoid eating large meals or meals 2-3 hours prior to sleeping. 

## 2021-11-21 ENCOUNTER — Telehealth: Payer: Self-pay | Admitting: *Deleted

## 2021-11-21 NOTE — Chronic Care Management (AMB) (Signed)
  Care Coordination  Outreach Note  11/21/2021 Name: [name] MRN: [id number] DOB: [birth year]   Care Coordination Outreach Attempts  A second unsuccessful outreach was attempted today to offer the patient with information about available care coordination services as a benefit of their health plan.     Follow Up Plan:  Additional outreach attempts will be made to offer the patient care coordination information and services.   Encounter Outcome:  No Answer   Julian Hy, [place] Direct Dial: [phone]

## 2021-11-24 IMAGING — US US BREAST*L* LIMITED INC AXILLA
1 series · 11 of 11 positions shown · non-contrast
Comparison: Previous exam(s).

CLINICAL DATA: Patient presents for pain within the upper-outer
left breast.

EXAM:
DIGITAL DIAGNOSTIC BILATERAL MAMMOGRAM WITH TOMOSYNTHESIS AND CAD;
ULTRASOUND LEFT BREAST LIMITED
TECHNIQUE: Bilateral digital diagnostic mammography and breast tomosynthesis
was performed. The images were evaluated with computer-aided
detection.; Targeted ultrasound examination of the left breast was
performed.

[Series 1: us breast*left* limited inc axilla · 0.07mm/px · 11 of 11 slices shown]
[im 1/11]
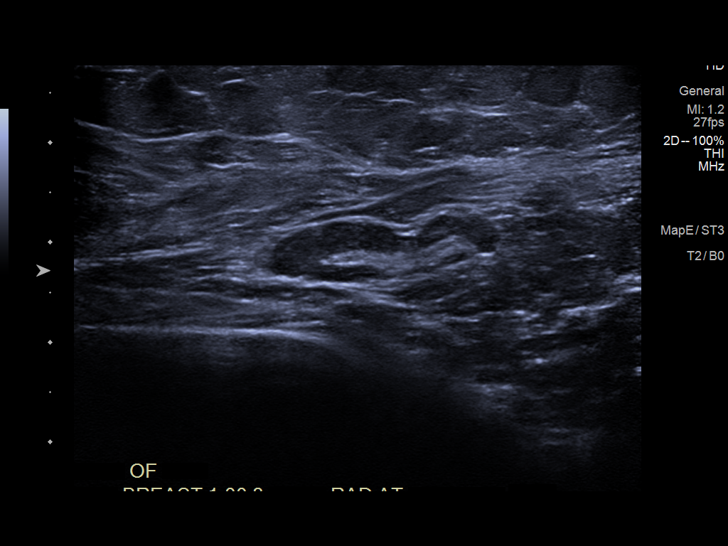
[im 2/11]
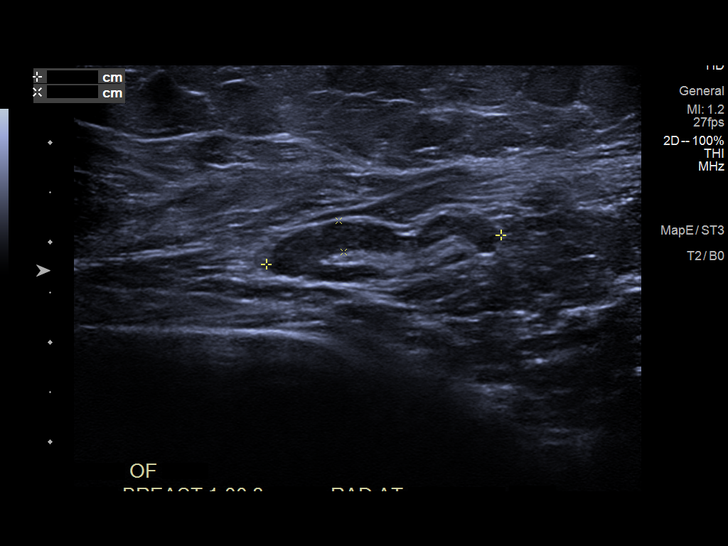
[im 3/11]
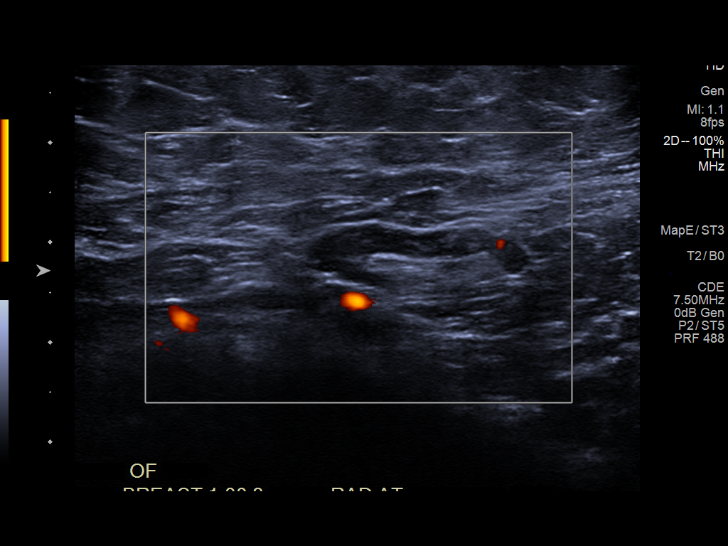
[im 4/11]
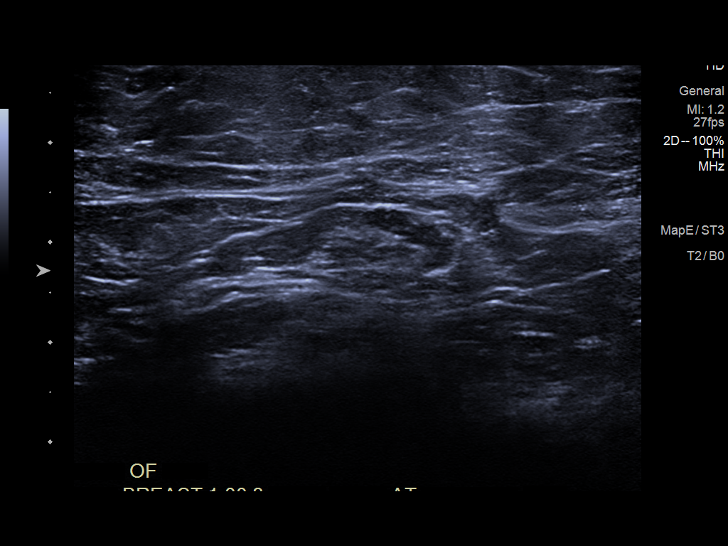
[im 5/11]
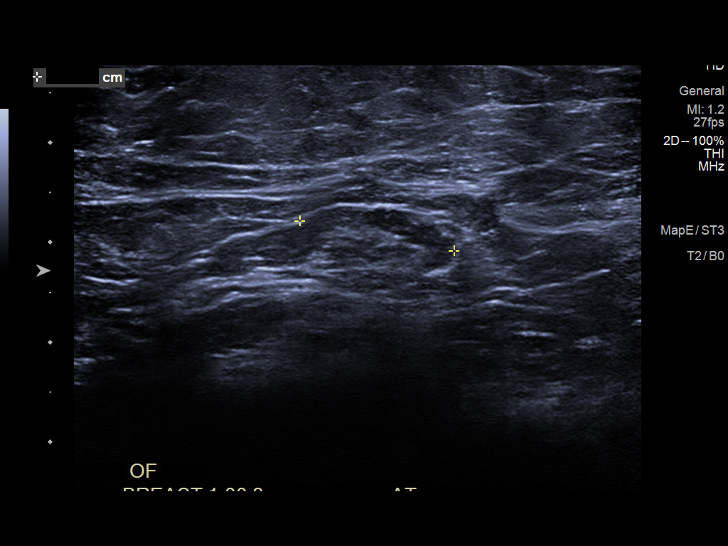
[im 6/11]
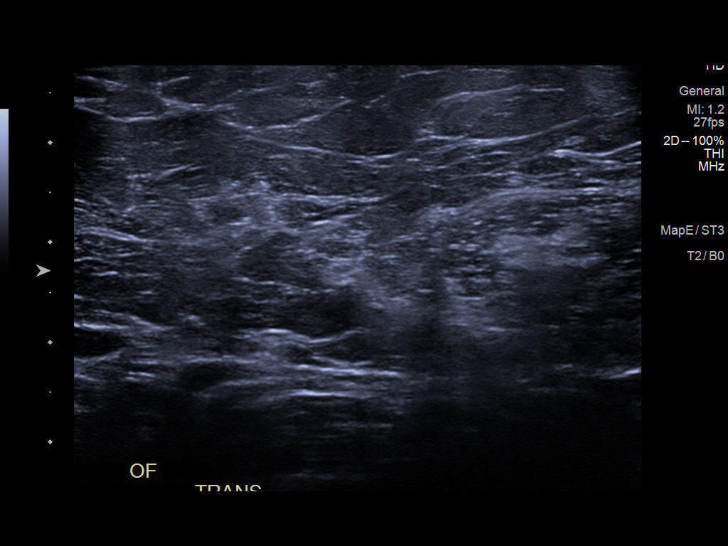
[im 7/11]
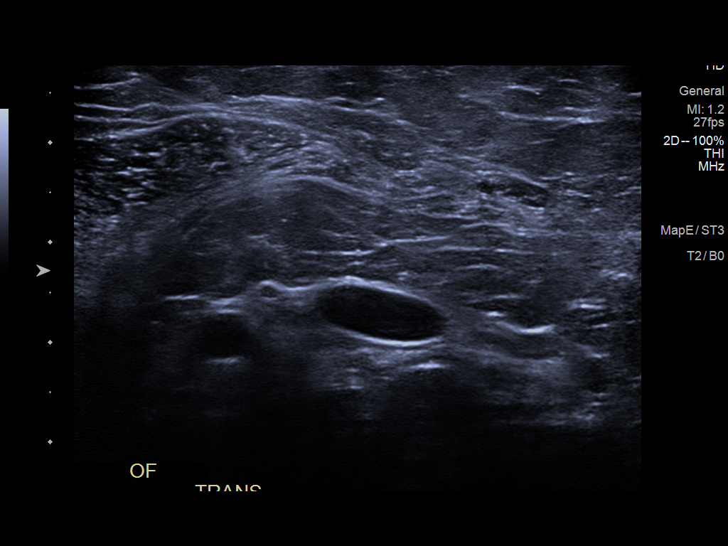
[im 8/11]
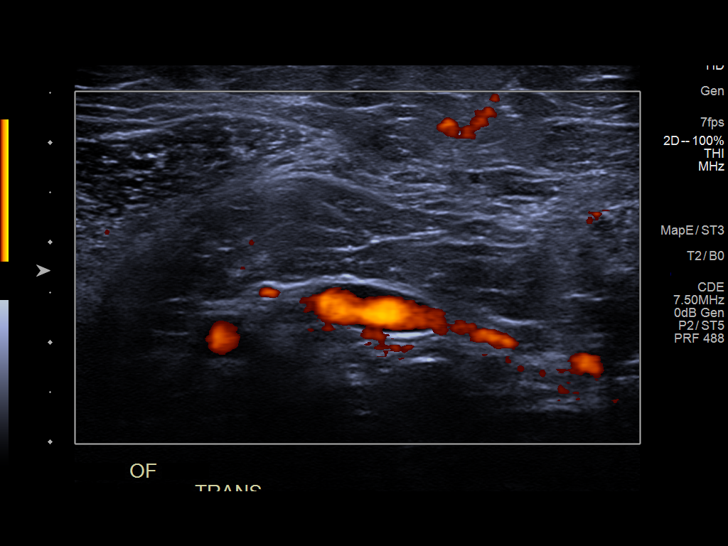
[im 9/11]
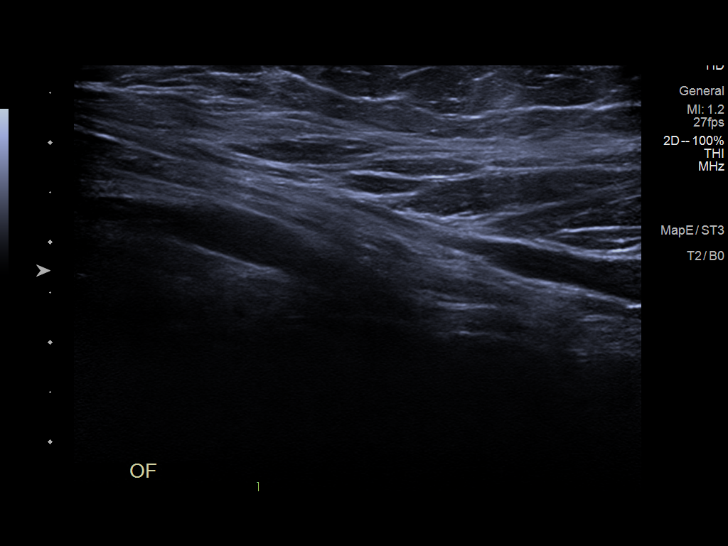
[im 10/11]
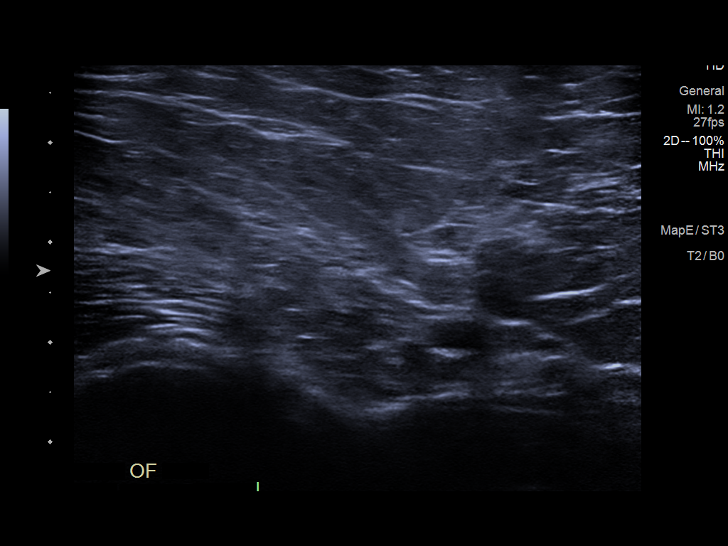
[im 11/11]
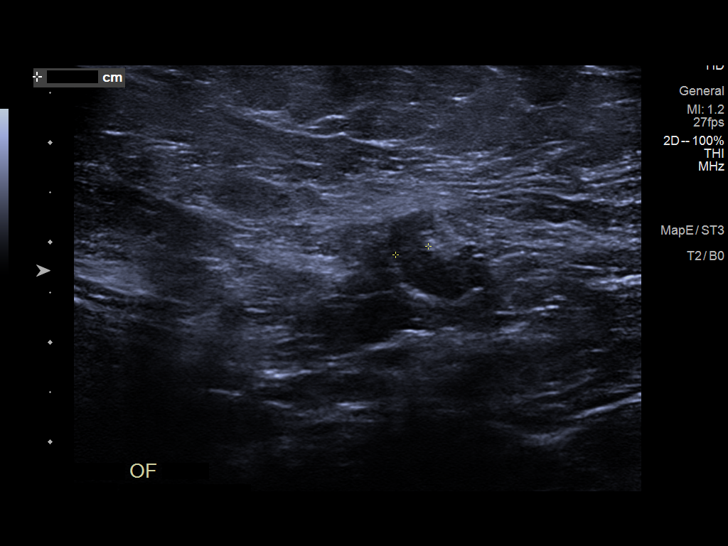

[11 of 11 positions shown; findings below may reference images not displayed]

ACR Breast Density Category c: The breast tissue is heterogeneously
dense, which may obscure small masses.
FINDINGS: No concerning masses, calcifications or distortion identified within
either breast.

Targeted ultrasound is performed, showing normal tissue without
suspicious mass upper outer left breast.
IMPRESSION: No mammographic evidence for malignancy.

RECOMMENDATION:
Screening mammogram in one year.(Code:ZM-0-1A3)

Continued clinical evaluation for left breast pain.

I have discussed the findings and recommendations with the patient.
If applicable, a reminder letter will be sent to the patient
regarding the next appointment.

BI-RADS CATEGORY  1: Negative.

## 2021-11-24 IMAGING — MG DIGITAL DIAGNOSTIC BILAT W/ TOMO W/ CAD
8 series · 8 of 24 positions shown · non-contrast
Comparison: Previous exam(s).

CLINICAL DATA: Patient presents for pain within the upper-outer
left breast.

EXAM:
DIGITAL DIAGNOSTIC BILATERAL MAMMOGRAM WITH TOMOSYNTHESIS AND CAD;
ULTRASOUND LEFT BREAST LIMITED
TECHNIQUE: Bilateral digital diagnostic mammography and breast tomosynthesis
was performed. The images were evaluated with computer-aided
detection.; Targeted ultrasound examination of the left breast was
performed.

[L MLO synth-2D]
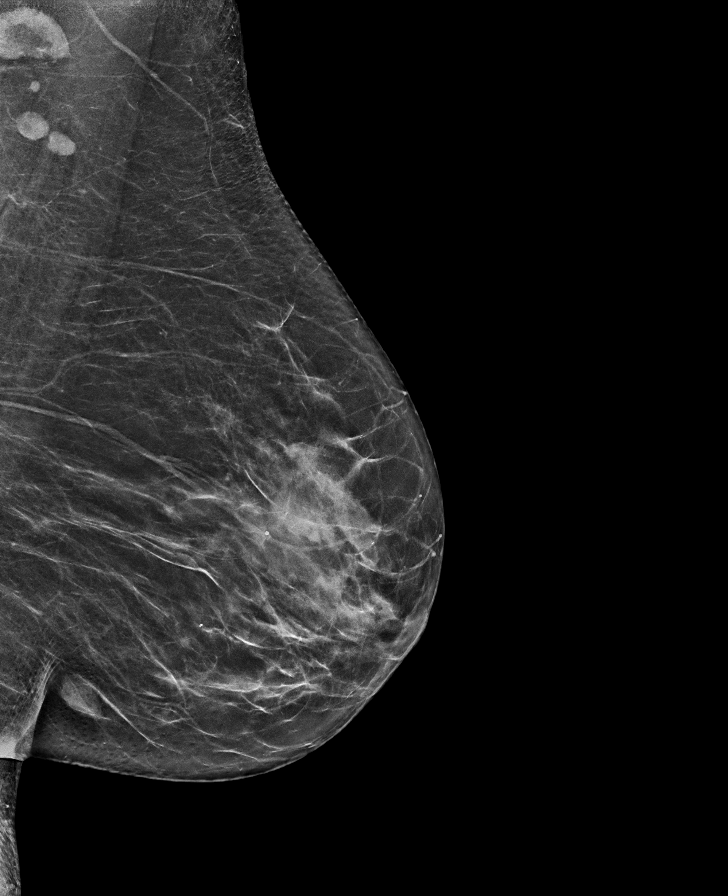

[R CC synth-2D]
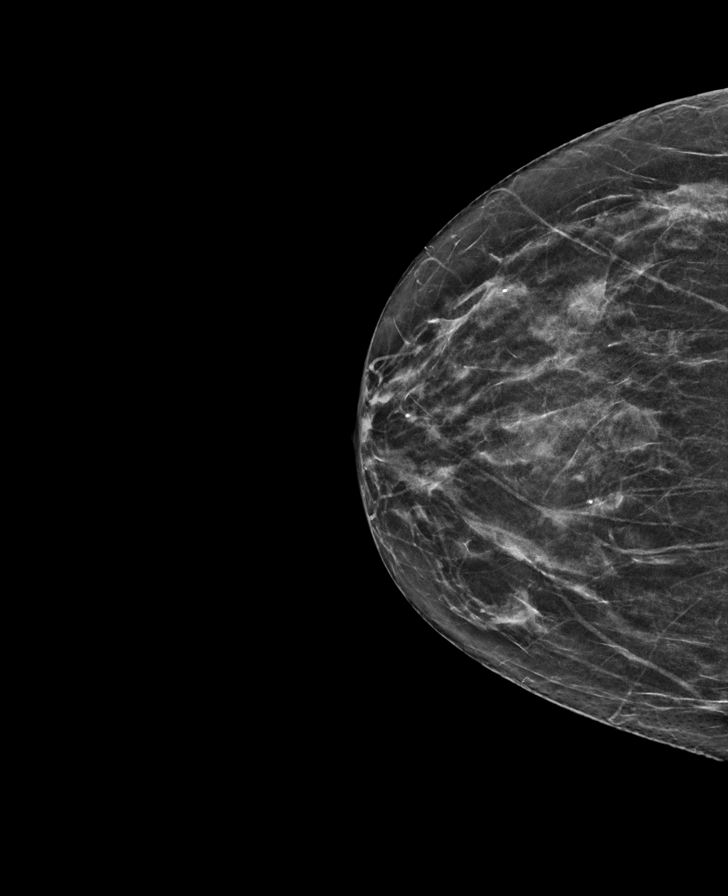

[R MLO synth-2D]
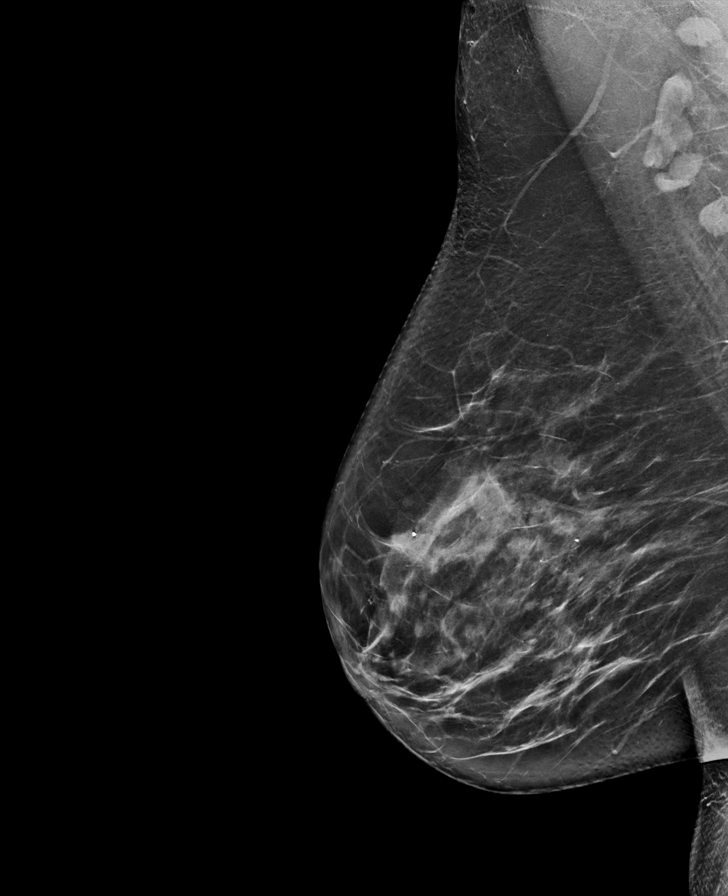

[L CC synth-2D]
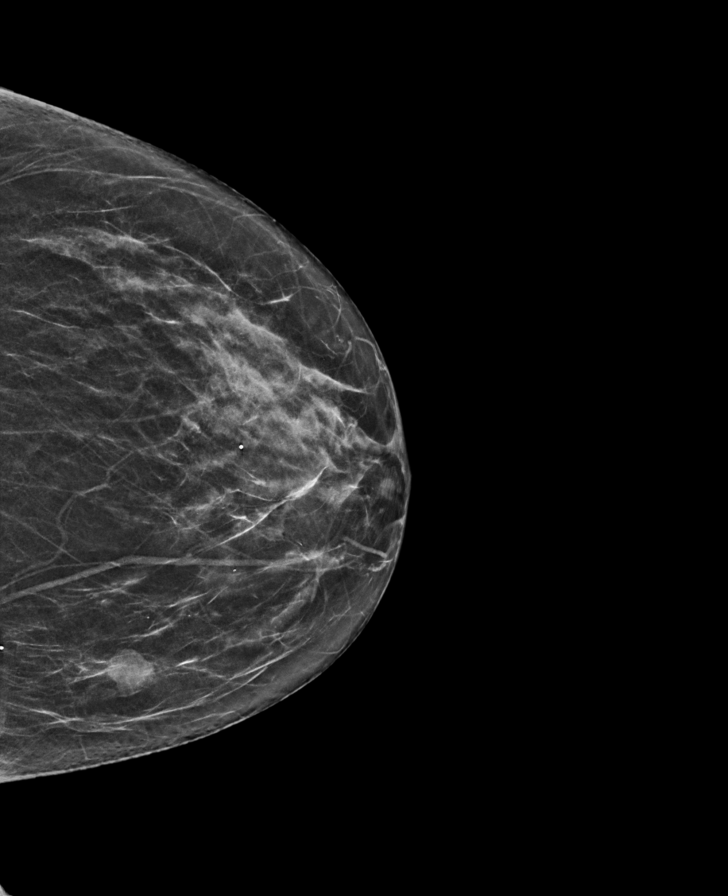

[L MLO tomo · tomo slice 34/67.0]
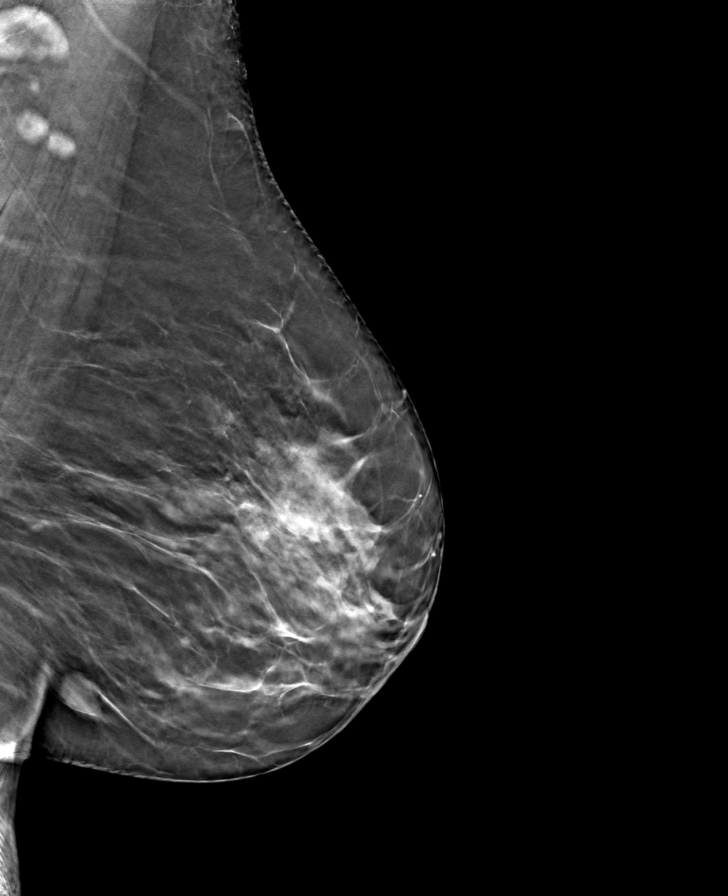

[R CC tomo · tomo slice 25/50.0]
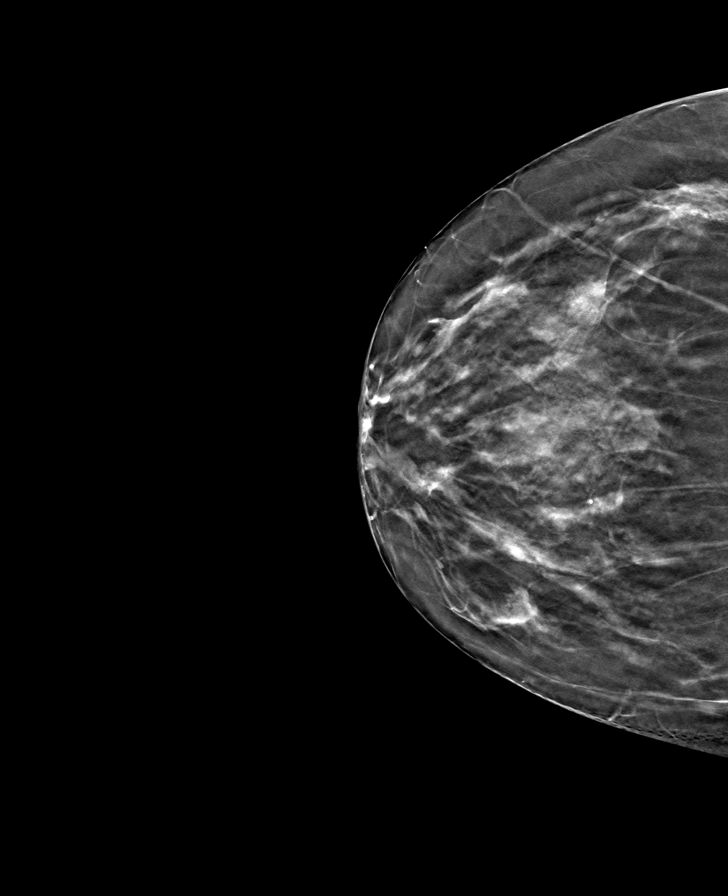

[R MLO tomo · tomo slice 41/81.0]
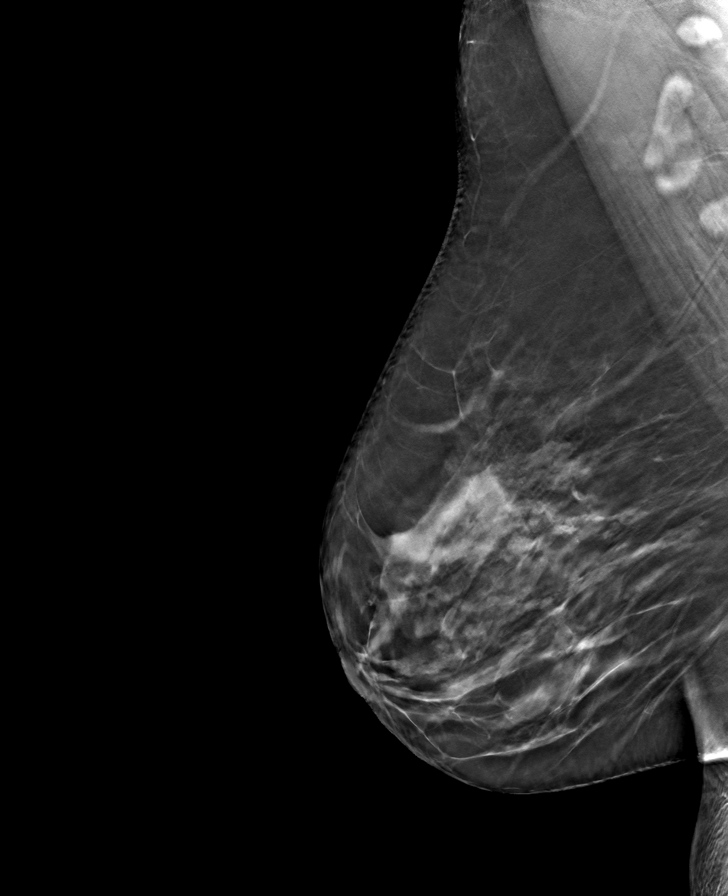

[L CC tomo · tomo slice 29/56.0]
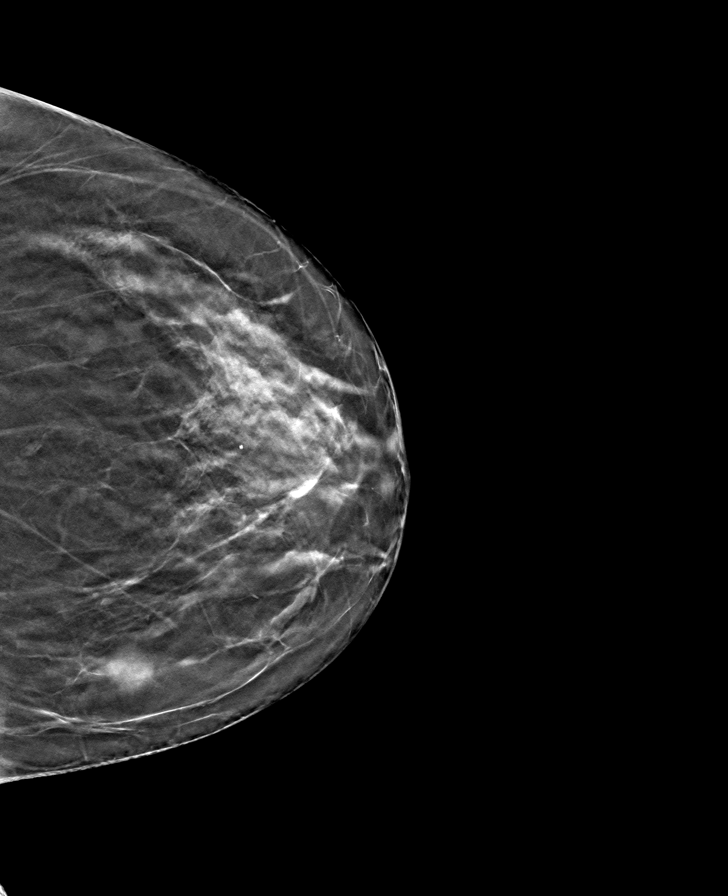

[8 of 24 positions shown; findings below may reference images not displayed]

ACR Breast Density Category c: The breast tissue is heterogeneously
dense, which may obscure small masses.
FINDINGS: No concerning masses, calcifications or distortion identified within
either breast.

Targeted ultrasound is performed, showing normal tissue without
suspicious mass upper outer left breast.
IMPRESSION: No mammographic evidence for malignancy.

RECOMMENDATION:
Screening mammogram in one year.(Code:ZM-0-1A3)

Continued clinical evaluation for left breast pain.

I have discussed the findings and recommendations with the patient.
If applicable, a reminder letter will be sent to the patient
regarding the next appointment.

BI-RADS CATEGORY  1: Negative.

## 2021-11-27 ENCOUNTER — Other Ambulatory Visit (INDEPENDENT_AMBULATORY_CARE_PROVIDER_SITE_OTHER): Payer: 59

## 2021-11-27 DIAGNOSIS — I2510 Atherosclerotic heart disease of native coronary artery without angina pectoris: Secondary | ICD-10-CM

## 2021-11-27 DIAGNOSIS — I10 Essential (primary) hypertension: Secondary | ICD-10-CM

## 2021-11-27 DIAGNOSIS — F32A Depression, unspecified: Secondary | ICD-10-CM

## 2021-11-27 DIAGNOSIS — F419 Anxiety disorder, unspecified: Secondary | ICD-10-CM

## 2021-11-27 DIAGNOSIS — K219 Gastro-esophageal reflux disease without esophagitis: Secondary | ICD-10-CM | POA: Diagnosis not present

## 2021-11-27 DIAGNOSIS — J410 Simple chronic bronchitis: Secondary | ICD-10-CM

## 2021-11-27 DIAGNOSIS — J42 Unspecified chronic bronchitis: Secondary | ICD-10-CM

## 2021-11-27 DIAGNOSIS — K3184 Gastroparesis: Secondary | ICD-10-CM | POA: Diagnosis not present

## 2021-11-27 DIAGNOSIS — R69 Illness, unspecified: Secondary | ICD-10-CM | POA: Diagnosis not present
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
## 2021-12-05 ENCOUNTER — Other Ambulatory Visit: Payer: Self-pay | Admitting: *Deleted

## 2021-12-05 ENCOUNTER — Encounter: Payer: Self-pay | Admitting: Internal Medicine

## 2021-12-05 MED ORDER — FLUTICASONE-SALMETEROL 100-50 MCG/ACT IN AEPB: 1.0000 | INHALATION_SPRAY | Freq: Two times a day (BID) | RESPIRATORY_TRACT | 6 refills | Status: DC

## 2021-12-09 ENCOUNTER — Telehealth: Payer: Self-pay

## 2021-12-09 ENCOUNTER — Encounter: Payer: Self-pay | Admitting: Internal Medicine

## 2021-12-09 ENCOUNTER — Ambulatory Visit: Payer: 59 | Admitting: Internal Medicine

## 2021-12-09 VITALS — BP 161/73 | HR 85 | Temp 97.9°F | Resp 16 | Ht 63.0 in | Wt 169.4 lb

## 2021-12-09 DIAGNOSIS — J449 Chronic obstructive pulmonary disease, unspecified: Secondary | ICD-10-CM | POA: Diagnosis not present

## 2021-12-09 DIAGNOSIS — I2583 Coronary atherosclerosis due to lipid rich plaque: Secondary | ICD-10-CM | POA: Diagnosis not present

## 2021-12-09 DIAGNOSIS — R0602 Shortness of breath: Secondary | ICD-10-CM

## 2021-12-09 DIAGNOSIS — I2510 Atherosclerotic heart disease of native coronary artery without angina pectoris: Secondary | ICD-10-CM

## 2021-12-09 DIAGNOSIS — G4719 Other hypersomnia: Secondary | ICD-10-CM
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
## 2021-12-09 NOTE — Telephone Encounter (Signed)
Awaiting 12/09/21 office notes for SS order-Toni

## 2021-12-09 NOTE — Patient Instructions (Signed)

## 2021-12-09 NOTE — Chronic Care Management (AMB) (Signed)
  Care Coordination  Outreach Note  12/09/2021 Name: [name] MRN: [id number] DOB: Oct 01, 1960   Care Coordination Outreach Attempts  A third unsuccessful outreach was attempted today to offer the patient with information about available care coordination services as a benefit of their health plan.   Follow Up Plan:  No further outreach attempts will be made at this time. We have been unable to contact the patient to offer or enroll patient in care coordination services  Encounter Outcome:  No Answer  Julian Hy, [place] Direct Dial: [phone]

## 2021-12-09 NOTE — Telephone Encounter (Signed)
SS order placed in [facility] folder-Toni 

## 2021-12-13 ENCOUNTER — Other Ambulatory Visit: Payer: Self-pay | Admitting: Interventional Radiology

## 2021-12-13 ENCOUNTER — Telehealth: Payer: Self-pay

## 2021-12-13 DIAGNOSIS — N2889 Other specified disorders of kidney and ureter: Secondary | ICD-10-CM

## 2021-12-13 NOTE — Telephone Encounter (Signed)
SS appointment 01/03/22-Toni

## 2022-01-02 ENCOUNTER — Ambulatory Visit
Admission: RE | Admit: 2022-01-02 | Discharge: 2022-01-02 | Disposition: A | Discharge status: Home / Self Care | Payer: 59 | Source: Ambulatory Visit | Attending: Interventional Radiology | Admitting: Interventional Radiology

## 2022-01-02 DIAGNOSIS — N281 Cyst of kidney, acquired: Secondary | ICD-10-CM | POA: Diagnosis not present

## 2022-01-02 DIAGNOSIS — J329 Chronic sinusitis, unspecified: Secondary | ICD-10-CM | POA: Diagnosis not present

## 2022-01-02 DIAGNOSIS — N2889 Other specified disorders of kidney and ureter: Secondary | ICD-10-CM | POA: Insufficient documentation

## 2022-01-02 DIAGNOSIS — J309 Allergic rhinitis, unspecified: Secondary | ICD-10-CM | POA: Diagnosis not present

## 2022-01-02 DIAGNOSIS — R0981 Nasal congestion: Secondary | ICD-10-CM | POA: Diagnosis not present

## 2022-01-02 DIAGNOSIS — Z9049 Acquired absence of other specified parts of digestive tract: Secondary | ICD-10-CM | POA: Diagnosis not present

## 2022-01-02 DIAGNOSIS — Q631 Lobulated, fused and horseshoe kidney: Secondary | ICD-10-CM | POA: Diagnosis not present

## 2022-01-02 DIAGNOSIS — B379 Candidiasis, unspecified: Secondary | ICD-10-CM | POA: Diagnosis not present

## 2022-01-02 MED ORDER — GADOBUTROL 1 MMOL/ML IV SOLN
7.5000 mL | Freq: Once | INTRAVENOUS | Status: AC | PRN
  Administered 2022-01-02: 7.5 mL via INTRAVENOUS

## 2022-01-07 ENCOUNTER — Ambulatory Visit
Admission: RE | Admit: 2022-01-07 | Discharge: 2022-01-07 | Disposition: A | Discharge status: Home / Self Care | Payer: 59 | Source: Ambulatory Visit | Attending: Interventional Radiology | Admitting: Interventional Radiology

## 2022-01-07 DIAGNOSIS — N2889 Other specified disorders of kidney and ureter: Secondary | ICD-10-CM

## 2022-01-07 HISTORY — PX: IR RADIOLOGIST EVAL & MGMT: IMG5224
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
## 2022-01-08 ENCOUNTER — Other Ambulatory Visit: Payer: Self-pay | Admitting: Internal Medicine

## 2022-01-08 ENCOUNTER — Ambulatory Visit: Payer: 59 | Admitting: Internal Medicine

## 2022-01-08 DIAGNOSIS — R0602 Shortness of breath: Secondary | ICD-10-CM | POA: Diagnosis not present

## 2022-01-08 DIAGNOSIS — Z1231 Encounter for screening mammogram for malignant neoplasm of breast: Secondary | ICD-10-CM

## 2022-01-11 NOTE — Procedures (Signed)
[facility] [address] [place], [address]    Complete Pulmonary Function Testing Interpretation:  FINDINGS:  Forced vital capacity is normal.  FEV1 is normal.  F1 FVC ratio was mildly decreased.  Postbronchodilator no significant changes noted in the FEV1.  Total lung capacity is increased residual volume is normal FRC is normal.  DLCO was normal.  Flow volume loop shows slight flattening of the expiratory limb clinical correlation is recommended  IMPRESSION:  This pulmonary function study is basically within normal limits  Allyne Gee, MD [facility] Pulmonary Critical Care Medicine Sleep Medicine

## 2022-01-17 LAB — PULMONARY FUNCTION TEST

## 2022-01-21 ENCOUNTER — Ambulatory Visit: Payer: 59 | Admitting: Internal Medicine

## 2022-01-21 DIAGNOSIS — J301 Allergic rhinitis due to pollen: Secondary | ICD-10-CM | POA: Diagnosis not present

## 2022-01-28 DIAGNOSIS — J301 Allergic rhinitis due to pollen: Secondary | ICD-10-CM | POA: Diagnosis not present

## 2022-02-03 DIAGNOSIS — J301 Allergic rhinitis due to pollen: Secondary | ICD-10-CM | POA: Diagnosis not present

## 2022-02-04 ENCOUNTER — Ambulatory Visit: Payer: 59 | Admitting: Internal Medicine

## 2022-02-04 ENCOUNTER — Telehealth: Payer: Self-pay | Admitting: Internal Medicine

## 2022-02-04 ENCOUNTER — Encounter: Payer: Self-pay | Admitting: Internal Medicine

## 2022-02-04 VITALS — BP 140/76 | HR 85 | Temp 98.5°F | Resp 16 | Ht 63.0 in | Wt 170.2 lb

## 2022-02-04 DIAGNOSIS — J4520 Mild intermittent asthma, uncomplicated: Secondary | ICD-10-CM

## 2022-02-04 DIAGNOSIS — J309 Allergic rhinitis, unspecified: Secondary | ICD-10-CM | POA: Diagnosis not present

## 2022-02-04 DIAGNOSIS — G4719 Other hypersomnia: Secondary | ICD-10-CM

## 2022-02-04 DIAGNOSIS — J329 Chronic sinusitis, unspecified: Secondary | ICD-10-CM | POA: Diagnosis not present

## 2022-02-04 MED ORDER — IPRATROPIUM-ALBUTEROL 0.5-2.5 (3) MG/3ML IN SOLN: 3.0000 mL | Freq: Four times a day (QID) | RESPIRATORY_TRACT | 4 refills | Status: DC | PRN

## 2022-02-04 MED ORDER — FLUTICASONE-SALMETEROL 45-21 MCG/ACT IN AERO: 2.0000 | INHALATION_SPRAY | Freq: Two times a day (BID) | RESPIRATORY_TRACT | 12 refills | Status: DC
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
## 2022-02-04 NOTE — Patient Instructions (Signed)

## 2022-02-04 NOTE — Telephone Encounter (Signed)
Awaiting 02/04/22 office notes for SS order-Toni

## 2022-02-06 ENCOUNTER — Telehealth: Payer: Self-pay

## 2022-02-06 DIAGNOSIS — J301 Allergic rhinitis due to pollen: Secondary | ICD-10-CM | POA: Diagnosis not present

## 2022-02-06 NOTE — Telephone Encounter (Signed)
Nimisha faxed over patient questionnaire needed for prior authorization to [phone].

## 2022-02-11 ENCOUNTER — Encounter: Payer: Self-pay | Admitting: Internal Medicine

## 2022-02-11 ENCOUNTER — Ambulatory Visit (INDEPENDENT_AMBULATORY_CARE_PROVIDER_SITE_OTHER): Payer: 59 | Admitting: Internal Medicine

## 2022-02-11 VITALS — BP 178/92 | HR 78 | Ht 63.0 in | Wt 170.0 lb

## 2022-02-11 DIAGNOSIS — J410 Simple chronic bronchitis: Secondary | ICD-10-CM

## 2022-02-11 DIAGNOSIS — I779 Disorder of arteries and arterioles, unspecified: Secondary | ICD-10-CM | POA: Diagnosis not present

## 2022-02-11 DIAGNOSIS — R69 Illness, unspecified: Secondary | ICD-10-CM | POA: Diagnosis not present

## 2022-02-11 DIAGNOSIS — I739 Peripheral vascular disease, unspecified: Secondary | ICD-10-CM

## 2022-02-11 DIAGNOSIS — F419 Anxiety disorder, unspecified: Secondary | ICD-10-CM

## 2022-02-11 DIAGNOSIS — I10 Essential (primary) hypertension: Secondary | ICD-10-CM

## 2022-02-11 DIAGNOSIS — F32A Depression, unspecified: Secondary | ICD-10-CM

## 2022-02-11 MED ORDER — AMLODIPINE BESYLATE 5 MG PO TABS: 5.0000 mg | ORAL_TABLET | Freq: Every day | ORAL | 3 refills | Status: DC

## 2022-02-11 MED ORDER — DIAZEPAM 5 MG PO TABS: 5.0000 mg | ORAL_TABLET | Freq: Two times a day (BID) | ORAL | 0 refills | Status: DC

## 2022-02-11 NOTE — Assessment & Plan Note (Signed)
Patient does not smoke anymore, lungs does not have any wheezing

## 2022-02-11 NOTE — Assessment & Plan Note (Signed)
-   Patient experiencing high levels of anxiety.  - Encouraged patient to engage in relaxing activities like yoga, meditation, journaling, going for a walk, or participating in a hobby.  - Encouraged patient to reach out to trusted friends or family members about recent struggles, Patient was advised to read A book, how to stop worrying and start living, it is good book to read to control  the stress  

## 2022-02-11 NOTE — Assessment & Plan Note (Signed)
  Refer to vascular specialist
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
## 2022-02-11 NOTE — Assessment & Plan Note (Signed)
Patient was advised to continue statin

## 2022-02-11 NOTE — Assessment & Plan Note (Signed)

## 2022-02-13 DIAGNOSIS — J301 Allergic rhinitis due to pollen: Secondary | ICD-10-CM | POA: Diagnosis not present

## 2022-02-17 ENCOUNTER — Encounter (INDEPENDENT_AMBULATORY_CARE_PROVIDER_SITE_OTHER): Payer: Self-pay

## 2022-02-17 ENCOUNTER — Telehealth: Payer: Self-pay | Admitting: Internal Medicine

## 2022-02-17 NOTE — Telephone Encounter (Signed)
SS order placed in [facility] folder-Toni 

## 2022-02-19 DIAGNOSIS — J301 Allergic rhinitis due to pollen: Secondary | ICD-10-CM | POA: Diagnosis not present

## 2022-02-20 DIAGNOSIS — J301 Allergic rhinitis due to pollen: Secondary | ICD-10-CM | POA: Diagnosis not present

## 2022-02-21 ENCOUNTER — Telehealth: Payer: Self-pay

## 2022-02-21 NOTE — Telephone Encounter (Signed)
Patient's P.A. for Nani Gasser was denied.

## 2022-02-27 ENCOUNTER — Ambulatory Visit
Admission: RE | Admit: 2022-02-27 | Discharge: 2022-02-27 | Disposition: A | Discharge status: Home / Self Care | Payer: 59 | Source: Ambulatory Visit | Attending: Internal Medicine | Admitting: Internal Medicine

## 2022-02-27 DIAGNOSIS — J301 Allergic rhinitis due to pollen: Secondary | ICD-10-CM | POA: Diagnosis not present

## 2022-02-27 DIAGNOSIS — Z1231 Encounter for screening mammogram for malignant neoplasm of breast: Secondary | ICD-10-CM | POA: Diagnosis not present

## 2022-03-03 ENCOUNTER — Ambulatory Visit: Payer: 59 | Admitting: Gastroenterology

## 2022-03-03 ENCOUNTER — Other Ambulatory Visit: Payer: Self-pay | Admitting: Internal Medicine

## 2022-03-03 ENCOUNTER — Encounter: Payer: Self-pay | Admitting: Gastroenterology

## 2022-03-03 DIAGNOSIS — N6489 Other specified disorders of breast: Secondary | ICD-10-CM

## 2022-03-03 DIAGNOSIS — K219 Gastro-esophageal reflux disease without esophagitis: Secondary | ICD-10-CM | POA: Diagnosis not present

## 2022-03-03 DIAGNOSIS — K625 Hemorrhage of anus and rectum: Secondary | ICD-10-CM

## 2022-03-03 DIAGNOSIS — R928 Other abnormal and inconclusive findings on diagnostic imaging of breast: Secondary | ICD-10-CM

## 2022-03-03 MED ORDER — PEG 3350-KCL-NA BICARB-NACL 420 G PO SOLR: 4000.0000 mL | Freq: Once | ORAL | 0 refills | Status: AC

## 2022-03-03 MED ORDER — NA SULFATE-K SULFATE-MG SULF 17.5-3.13-1.6 GM/177ML PO SOLN: 1.0000 | Freq: Once | ORAL | 0 refills | Status: AC
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
## 2022-03-06 ENCOUNTER — Telehealth: Payer: Self-pay | Admitting: Internal Medicine

## 2022-03-06 ENCOUNTER — Other Ambulatory Visit: Payer: Self-pay

## 2022-03-06 ENCOUNTER — Encounter: Payer: Self-pay | Admitting: Internal Medicine

## 2022-03-06 DIAGNOSIS — J301 Allergic rhinitis due to pollen: Secondary | ICD-10-CM | POA: Diagnosis not present

## 2022-03-06 DIAGNOSIS — R928 Other abnormal and inconclusive findings on diagnostic imaging of breast: Secondary | ICD-10-CM

## 2022-03-06 NOTE — Telephone Encounter (Signed)
SS faxed to [facility]; [phone]

## 2022-03-18 ENCOUNTER — Ambulatory Visit: Payer: 59 | Admitting: Internal Medicine

## 2022-03-18 ENCOUNTER — Encounter: Payer: Self-pay | Admitting: Internal Medicine

## 2022-03-18 VITALS — BP 122/84 | HR 94 | Temp 97.5°F | Ht 63.0 in | Wt 170.5 lb

## 2022-03-18 DIAGNOSIS — I10 Essential (primary) hypertension: Secondary | ICD-10-CM | POA: Diagnosis not present

## 2022-03-18 DIAGNOSIS — I739 Peripheral vascular disease, unspecified: Secondary | ICD-10-CM

## 2022-03-18 DIAGNOSIS — J410 Simple chronic bronchitis: Secondary | ICD-10-CM | POA: Diagnosis not present

## 2022-03-18 DIAGNOSIS — I639 Cerebral infarction, unspecified: Secondary | ICD-10-CM

## 2022-03-18 DIAGNOSIS — I6523 Occlusion and stenosis of bilateral carotid arteries: Secondary | ICD-10-CM

## 2022-03-18 DIAGNOSIS — K219 Gastro-esophageal reflux disease without esophagitis: Secondary | ICD-10-CM | POA: Diagnosis not present

## 2022-03-18 DIAGNOSIS — G459 Transient cerebral ischemic attack, unspecified: Secondary | ICD-10-CM

## 2022-03-18 DIAGNOSIS — F419 Anxiety disorder, unspecified: Secondary | ICD-10-CM

## 2022-03-18 DIAGNOSIS — I420 Dilated cardiomyopathy: Secondary | ICD-10-CM

## 2022-03-18 DIAGNOSIS — R69 Illness, unspecified: Secondary | ICD-10-CM | POA: Diagnosis not present

## 2022-03-18 DIAGNOSIS — F32A Depression, unspecified: Secondary | ICD-10-CM

## 2022-03-18 MED ORDER — DIAZEPAM 5 MG PO TABS: 5.0000 mg | ORAL_TABLET | Freq: Two times a day (BID) | ORAL | 0 refills | Status: DC

## 2022-03-18 NOTE — Assessment & Plan Note (Signed)

## 2022-03-18 NOTE — Assessment & Plan Note (Signed)
-   Patient experiencing high levels of anxiety.  - Encouraged patient to engage in relaxing activities like yoga, meditation, journaling, going for a walk, or participating in a hobby.  - Encouraged patient to reach out to trusted friends or family members about recent struggles, Patient was advised to read A book, how to stop worrying and start living, it is good book to read to control  the stress  

## 2022-03-18 NOTE — Assessment & Plan Note (Signed)

## 2022-03-18 NOTE — Assessment & Plan Note (Signed)
No recurrence of TIA

## 2022-03-18 NOTE — Assessment & Plan Note (Signed)
Stable at the present time. 

## 2022-03-18 NOTE — Assessment & Plan Note (Signed)
Patient does not smoke anymore ?

## 2022-03-18 NOTE — Assessment & Plan Note (Signed)
Patient was advised to walk every day
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
## 2022-03-19 ENCOUNTER — Encounter: Payer: Self-pay | Admitting: Internal Medicine

## 2022-03-19 MED ORDER — ATORVASTATIN CALCIUM 40 MG PO TABS: 40.0000 mg | ORAL_TABLET | Freq: Every day | ORAL | 3 refills | Status: DC

## 2022-03-19 MED ORDER — LOSARTAN POTASSIUM 100 MG PO TABS: 100.0000 mg | ORAL_TABLET | Freq: Every day | ORAL | 1 refills | Status: DC

## 2022-03-20 DIAGNOSIS — J301 Allergic rhinitis due to pollen: Secondary | ICD-10-CM | POA: Diagnosis not present

## 2022-03-20 NOTE — Telephone Encounter (Signed)
Patient scheduled for sleep study on 03/24/22

## 2022-03-21 ENCOUNTER — Telehealth: Payer: Self-pay

## 2022-03-21 NOTE — Telephone Encounter (Signed)
Sent patient's P.A. for Advair.

## 2022-03-24 ENCOUNTER — Encounter: Payer: Self-pay | Admitting: Internal Medicine

## 2022-03-24 ENCOUNTER — Ambulatory Visit: Payer: 59

## 2022-03-24 ENCOUNTER — Encounter: Payer: Self-pay | Admitting: Gastroenterology

## 2022-03-25 ENCOUNTER — Telehealth: Payer: Self-pay | Admitting: Internal Medicine

## 2022-03-25 NOTE — Telephone Encounter (Signed)
Per FG, patient cancelled 03/24/22 SS. She will call back to r/s-Toni

## 2022-03-27 DIAGNOSIS — J301 Allergic rhinitis due to pollen: Secondary | ICD-10-CM | POA: Diagnosis not present

## 2022-03-28 ENCOUNTER — Ambulatory Visit
Admission: RE | Admit: 2022-03-28 | Discharge: 2022-03-28 | Disposition: A | Discharge status: Home / Self Care | Payer: 59 | Source: Ambulatory Visit | Attending: Internal Medicine | Admitting: Internal Medicine

## 2022-03-28 DIAGNOSIS — R928 Other abnormal and inconclusive findings on diagnostic imaging of breast: Secondary | ICD-10-CM

## 2022-03-28 DIAGNOSIS — N6489 Other specified disorders of breast: Secondary | ICD-10-CM

## 2022-03-28 DIAGNOSIS — R922 Inconclusive mammogram: Secondary | ICD-10-CM | POA: Diagnosis not present

## 2022-03-31 ENCOUNTER — Encounter: Payer: Self-pay | Admitting: Internal Medicine

## 2022-03-31 ENCOUNTER — Other Ambulatory Visit: Payer: Self-pay | Admitting: Internal Medicine

## 2022-03-31 DIAGNOSIS — N6001 Solitary cyst of right breast: Secondary | ICD-10-CM

## 2022-03-31 DIAGNOSIS — R928 Other abnormal and inconclusive findings on diagnostic imaging of breast: Secondary | ICD-10-CM

## 2022-04-01 ENCOUNTER — Ambulatory Visit: Payer: 59 | Admitting: Certified Registered"

## 2022-04-01 ENCOUNTER — Other Ambulatory Visit: Payer: Self-pay | Admitting: Internal Medicine

## 2022-04-01 ENCOUNTER — Other Ambulatory Visit: Payer: Self-pay

## 2022-04-01 ENCOUNTER — Encounter: Payer: Self-pay | Admitting: Gastroenterology

## 2022-04-01 ENCOUNTER — Encounter
Admission: RE | Disposition: A | Discharge status: Home / Self Care | Payer: Self-pay | Source: Home / Self Care | Attending: Gastroenterology

## 2022-04-01 ENCOUNTER — Ambulatory Visit
Admission: RE | Admit: 2022-04-01 | Discharge: 2022-04-01 | Disposition: A | Discharge status: Home / Self Care | Payer: 59 | Attending: Gastroenterology | Admitting: Gastroenterology

## 2022-04-01 DIAGNOSIS — K621 Rectal polyp: Secondary | ICD-10-CM | POA: Diagnosis not present

## 2022-04-01 DIAGNOSIS — D122 Benign neoplasm of ascending colon: Secondary | ICD-10-CM | POA: Insufficient documentation

## 2022-04-01 DIAGNOSIS — K5730 Diverticulosis of large intestine without perforation or abscess without bleeding: Secondary | ICD-10-CM | POA: Diagnosis not present

## 2022-04-01 DIAGNOSIS — D123 Benign neoplasm of transverse colon: Secondary | ICD-10-CM | POA: Diagnosis not present

## 2022-04-01 DIAGNOSIS — K649 Unspecified hemorrhoids: Secondary | ICD-10-CM | POA: Diagnosis not present

## 2022-04-01 DIAGNOSIS — R69 Illness, unspecified: Secondary | ICD-10-CM | POA: Diagnosis not present

## 2022-04-01 DIAGNOSIS — I252 Old myocardial infarction: Secondary | ICD-10-CM | POA: Insufficient documentation

## 2022-04-01 DIAGNOSIS — I10 Essential (primary) hypertension: Secondary | ICD-10-CM | POA: Insufficient documentation

## 2022-04-01 DIAGNOSIS — D125 Benign neoplasm of sigmoid colon: Secondary | ICD-10-CM | POA: Insufficient documentation

## 2022-04-01 DIAGNOSIS — K641 Second degree hemorrhoids: Secondary | ICD-10-CM | POA: Insufficient documentation

## 2022-04-01 DIAGNOSIS — K219 Gastro-esophageal reflux disease without esophagitis: Secondary | ICD-10-CM | POA: Diagnosis not present

## 2022-04-01 DIAGNOSIS — K635 Polyp of colon: Secondary | ICD-10-CM | POA: Diagnosis not present

## 2022-04-01 DIAGNOSIS — K2970 Gastritis, unspecified, without bleeding: Secondary | ICD-10-CM | POA: Insufficient documentation

## 2022-04-01 DIAGNOSIS — K921 Melena: Secondary | ICD-10-CM | POA: Insufficient documentation

## 2022-04-01 DIAGNOSIS — N6001 Solitary cyst of right breast: Secondary | ICD-10-CM

## 2022-04-01 DIAGNOSIS — D128 Benign neoplasm of rectum: Secondary | ICD-10-CM | POA: Diagnosis not present

## 2022-04-01 DIAGNOSIS — D126 Benign neoplasm of colon, unspecified: Secondary | ICD-10-CM | POA: Diagnosis not present

## 2022-04-01 DIAGNOSIS — K625 Hemorrhage of anus and rectum: Secondary | ICD-10-CM | POA: Diagnosis not present

## 2022-04-01 DIAGNOSIS — I2510 Atherosclerotic heart disease of native coronary artery without angina pectoris: Secondary | ICD-10-CM | POA: Insufficient documentation

## 2022-04-01 DIAGNOSIS — R928 Other abnormal and inconclusive findings on diagnostic imaging of breast: Secondary | ICD-10-CM

## 2022-04-01 HISTORY — PX: COLONOSCOPY WITH PROPOFOL: SHX5780

## 2022-04-01 HISTORY — PX: ESOPHAGOGASTRODUODENOSCOPY: SHX5428

## 2022-04-01 SURGERY — COLONOSCOPY WITH PROPOFOL
Anesthesia: General

## 2022-04-01 MED ORDER — PHENYLEPHRINE HCL (PRESSORS) 10 MG/ML IV SOLN
INTRAVENOUS | Status: DC | PRN
  Administered 2022-04-01: 120 ug via INTRAVENOUS
  Administered 2022-04-01 (×2): 160 ug via INTRAVENOUS

## 2022-04-01 MED ORDER — PROPOFOL 10 MG/ML IV BOLUS
INTRAVENOUS | Status: DC | PRN
  Administered 2022-04-01: 120 ug/kg/min via INTRAVENOUS

## 2022-04-01 MED ORDER — LACTATED RINGERS IV SOLN: INTRAVENOUS | Status: DC | PRN

## 2022-04-01 MED ORDER — ONDANSETRON HCL 4 MG/2ML IJ SOLN
INTRAMUSCULAR | Status: DC | PRN
  Administered 2022-04-01: 4 mg via INTRAVENOUS

## 2022-04-01 MED ORDER — PROPOFOL 10 MG/ML IV BOLUS
INTRAVENOUS | Status: AC
  Filled 2022-04-01: qty 40

## 2022-04-01 MED ORDER — SODIUM CHLORIDE 0.9 % IV SOLN: INTRAVENOUS | Status: DC

## 2022-04-01 MED ORDER — LIDOCAINE HCL (CARDIAC) PF 100 MG/5ML IV SOSY
PREFILLED_SYRINGE | INTRAVENOUS | Status: DC | PRN
  Administered 2022-04-01: 100 mg via INTRAVENOUS

## 2022-04-01 NOTE — Op Note (Signed)
[facility] Gastroenterology Patient Name: [name] Procedure Date: 04/01/2022 7:51 AM MRN: [id number] Account #: [id number] Date of Birth: August 08, 1960 Admit Type: Outpatient Age: 61 Room: 4 Gender: Female Note Status: Finalized Instrument Name: Upper Endoscope [id number] Procedure:             Upper GI endoscopy Indications:           Heartburn Providers:             Lucilla Lame MD, MD Medicines:             Propofol per Anesthesia Complications:         No immediate complications. Procedure:             Pre-Anesthesia Assessment:                        - Prior to the procedure, a History and Physical was                         performed, and patient medications and allergies were                         reviewed. The patient's tolerance of previous                         anesthesia was also reviewed. The risks and benefits                         of the procedure and the sedation options and risks                         were discussed with the patient. All questions were                         answered, and informed consent was obtained. Prior                         Anticoagulants: The patient has taken Plavix                         (clopidogrel), last dose was 5 days prior to                         procedure. ASA Grade Assessment: II - A patient with                         mild systemic disease. After reviewing the risks and                         benefits, the patient was deemed in satisfactory                         condition to undergo the procedure.                        After obtaining informed consent, the endoscope was                         passed under direct vision. Throughout the procedure,  the patient's blood pressure, pulse, and oxygen                         saturations were monitored continuously. The Endoscope                         was introduced through the mouth, and advanced to the                          second part of duodenum. The upper GI endoscopy was                         accomplished without difficulty. The patient tolerated                         the procedure well. Findings:      The examined esophagus was normal.      Localized moderate inflammation characterized by erythema was found in       the gastric antrum. Biopsies were taken with a cold forceps for       histology.      The examined duodenum was normal. Impression:            - Normal esophagus.                        - Gastritis. Biopsied.                        - Normal examined duodenum. Recommendation:        - Discharge patient to home.                        - Resume previous diet.                        - Continue present medications.                        - Perform a colonoscopy today. Procedure Code(s):     --- Professional ---                        [id number], Esophagogastroduodenoscopy, flexible,                         transoral; with biopsy, single or multiple Diagnosis Code(s):     --- Professional ---                        R12, Heartburn                        K29.70, Gastritis, unspecified, without bleeding CPT copyright 2022 American Medical Association. All rights reserved. The codes documented in this report are preliminary and upon coder review may  be revised to meet current compliance requirements. Lucilla Lame MD, MD 04/01/2022 8:08:32 AM This report has been signed electronically. Number of Addenda: 0 Note Initiated On: 04/01/2022 7:51 AM Estimated Blood Loss:  Estimated blood loss: none.      [facility]

## 2022-04-01 NOTE — Op Note (Signed)
[facility] Gastroenterology Patient Name: [name] Procedure Date: 04/01/2022 7:29 AM MRN: [id number] Account #: [id number] Date of Birth: Jul 21, 1960 Admit Type: Outpatient Age: 61 Room: [facility] ENDO ROOM 4 Gender: Female Note Status: Finalized Instrument Name: Jasper Riling [id number] Procedure:             Colonoscopy Indications:           Hematochezia Providers:             Lucilla Lame MD, MD Medicines:             Propofol per Anesthesia Complications:         No immediate complications. Procedure:             Pre-Anesthesia Assessment:                        - Prior to the procedure, a History and Physical was                         performed, and patient medications and allergies were                         reviewed. The patient's tolerance of previous                         anesthesia was also reviewed. The risks and benefits                         of the procedure and the sedation options and risks                         were discussed with the patient. All questions were                         answered, and informed consent was obtained. Prior                         Anticoagulants: The patient has taken Plavix                         (clopidogrel), last dose was 5 days prior to                         procedure. ASA Grade Assessment: II - A patient with                         mild systemic disease. After reviewing the risks and                         benefits, the patient was deemed in satisfactory                         condition to undergo the procedure.                        After obtaining informed consent, the colonoscope was                         passed under direct vision. Throughout the procedure,  the patient's blood pressure, pulse, and oxygen                         saturations were monitored continuously. The                         Colonoscope was introduced through the anus and                          advanced to the the cecum, identified by appendiceal                         orifice and ileocecal valve. The colonoscopy was                         performed without difficulty. The patient tolerated                         the procedure well. The quality of the bowel                         preparation was excellent. Findings:      The perianal and digital rectal examinations were normal.      Four sessile polyps were found in the ascending colon. The polyps were 3       to 8 mm in size. These polyps were removed with a cold snare. Resection       and retrieval were complete.      A 6 mm polyp was found in the transverse colon. The polyp was sessile.       The polyp was removed with a cold snare. Resection and retrieval were       complete.      Two sessile polyps were found in the sigmoid colon. The polyps were 3 to       6 mm in size. These polyps were removed with a cold snare. Resection and       retrieval were complete.      A large polyp was found in the rectum. Biopsies were taken with a cold       forceps for histology.      Multiple small-mouthed diverticula were found in the sigmoid colon.      Non-bleeding internal hemorrhoids were found during retroflexion. The       hemorrhoids were Grade II (internal hemorrhoids that prolapse but reduce       spontaneously). Impression:            - Four 3 to 8 mm polyps in the ascending colon,                         removed with a cold snare. Resected and retrieved.                        - One 6 mm polyp in the transverse colon, removed with                         a cold snare. Resected and retrieved.                        - Two 3 to 6 mm  polyps in the sigmoid colon, removed                         with a cold snare. Resected and retrieved.                        - One large polyp in the rectum. Biopsied.                        - Diverticulosis in the sigmoid colon.                        - Non-bleeding internal  hemorrhoids. Recommendation:        - Discharge patient to home.                        - Resume previous diet.                        - Continue present medications.                        - Await pathology results.                        - If the pathology report reveals adenomatous tissue,                         then repeat the colonoscopy for surveillance in 3                         years.                        - If rectal lesion adenomatous then will need removal Procedure Code(s):     --- Professional ---                        [phone], Colonoscopy, flexible; with removal of                         tumor(s), polyp(s), or other lesion(s) by snare                         technique                        45380, 4, Colonoscopy, flexible; with biopsy, single                         or multiple Diagnosis Code(s):     --- Professional ---                        K92.1, Melena (includes Hematochezia)                        D12.2, Benign neoplasm of ascending colon                        D12.3, Benign neoplasm of transverse colon (hepatic  flexure or splenic flexure) CPT copyright 2022 American Medical Association. All rights reserved. The codes documented in this report are preliminary and upon coder review may  be revised to meet current compliance requirements. Lucilla Lame MD, MD 04/01/2022 8:36:02 AM This report has been signed electronically. Number of Addenda: 0 Note Initiated On: 04/01/2022 7:29 AM Scope Withdrawal Time: 0 hours 11 minutes 38 seconds  Total Procedure Duration: 0 hours 20 minutes 51 seconds  Estimated Blood Loss:  Estimated blood loss: none. Estimated blood loss: none.      [facility]
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
## 2022-04-01 NOTE — Transfer of Care (Signed)
Immediate Anesthesia Transfer of Care Note  Patient: [name]  Procedure(s) Performed: COLONOSCOPY WITH PROPOFOL ESOPHAGOGASTRODUODENOSCOPY (EGD)  Patient Location: PACU  Anesthesia Type:General  Level of Consciousness: awake and oriented  Airway & Oxygen Therapy: Patient Spontanous Breathing  Post-op Assessment: Report given to RN and Post -op Vital signs reviewed and stable  Post vital signs: Reviewed  Last Vitals:  Vitals Value Taken Time  BP 86/55 04/01/22 0836  Temp    Pulse 95 04/01/22 0836  Resp 15 04/01/22 0836  SpO2 97 % 04/01/22 0836    Last Pain:  Vitals:   04/01/22 0836  TempSrc: Temporal  PainSc: Asleep         Complications: No notable events documented.

## 2022-04-01 NOTE — Anesthesia Postprocedure Evaluation (Signed)
Anesthesia Post Note  Patient: [name]  Procedure(s) Performed: COLONOSCOPY WITH PROPOFOL ESOPHAGOGASTRODUODENOSCOPY (EGD)  Patient location during evaluation: Endoscopy Anesthesia Type: General Level of consciousness: awake and alert Pain management: pain level controlled Vital Signs Assessment: post-procedure vital signs reviewed and stable Respiratory status: spontaneous breathing, nonlabored ventilation, respiratory function stable and patient connected to nasal cannula oxygen Cardiovascular status: blood pressure returned to baseline and stable Postop Assessment: no apparent nausea or vomiting Anesthetic complications: no  No notable events documented.   Last Vitals:  Vitals:   04/01/22 0856 04/01/22 0906  BP: (!) 107/55 111/66  Pulse: 68 69  Resp: 20 13  Temp:    SpO2: 99% 99%    Last Pain:  Vitals:   04/01/22 0906  TempSrc:   PainSc: 0-No pain                 Ilene Qua

## 2022-04-02 ENCOUNTER — Encounter: Payer: Self-pay | Admitting: Gastroenterology

## 2022-04-02 ENCOUNTER — Ambulatory Visit: Payer: 59 | Admitting: Gastroenterology

## 2022-04-02 LAB — SURGICAL PATHOLOGY

## 2022-04-03 DIAGNOSIS — J301 Allergic rhinitis due to pollen: Secondary | ICD-10-CM | POA: Diagnosis not present

## 2022-04-07 ENCOUNTER — Encounter: Payer: Self-pay | Admitting: Gastroenterology

## 2022-04-08 ENCOUNTER — Other Ambulatory Visit: Payer: Self-pay | Admitting: Internal Medicine

## 2022-04-09 ENCOUNTER — Ambulatory Visit
Admission: RE | Admit: 2022-04-09 | Discharge: 2022-04-09 | Disposition: A | Discharge status: Home / Self Care | Payer: 59 | Source: Ambulatory Visit | Attending: Internal Medicine | Admitting: Internal Medicine

## 2022-04-09 DIAGNOSIS — R928 Other abnormal and inconclusive findings on diagnostic imaging of breast: Secondary | ICD-10-CM | POA: Insufficient documentation

## 2022-04-09 DIAGNOSIS — N6001 Solitary cyst of right breast: Secondary | ICD-10-CM

## 2022-04-09 DIAGNOSIS — N6313 Unspecified lump in the right breast, lower outer quadrant: Secondary | ICD-10-CM | POA: Diagnosis not present

## 2022-04-09 DIAGNOSIS — N6081 Other benign mammary dysplasias of right breast: Secondary | ICD-10-CM | POA: Diagnosis not present

## 2022-04-09 HISTORY — PX: OTHER SURGICAL HISTORY: SHX169

## 2022-04-09 HISTORY — PX: BREAST BIOPSY: SHX20

## 2022-04-09 MED ORDER — LIDOCAINE HCL (PF) 1 % IJ SOLN
3.0000 mL | Freq: Once | INTRAMUSCULAR | Status: AC
  Administered 2022-04-09: 3 mL
  Filled 2022-04-09: qty 4

## 2022-04-09 MED ORDER — LIDOCAINE-EPINEPHRINE 1 %-1:100000 IJ SOLN
10.0000 mL | Freq: Once | INTRAMUSCULAR | Status: AC
  Administered 2022-04-09: 10 mL
  Filled 2022-04-09: qty 10

## 2022-04-10 DIAGNOSIS — J301 Allergic rhinitis due to pollen: Secondary | ICD-10-CM | POA: Diagnosis not present

## 2022-04-10 LAB — SURGICAL PATHOLOGY

## 2022-04-11 ENCOUNTER — Telehealth: Payer: Self-pay

## 2022-04-11 NOTE — Telephone Encounter (Signed)
Sent P.A. for patient's Advair.

## 2022-04-23 ENCOUNTER — Encounter: Payer: Self-pay | Admitting: Nurse Practitioner

## 2022-04-28 DIAGNOSIS — J301 Allergic rhinitis due to pollen: Secondary | ICD-10-CM | POA: Diagnosis not present

## 2022-05-01 DIAGNOSIS — J301 Allergic rhinitis due to pollen: Secondary | ICD-10-CM | POA: Diagnosis not present

## 2022-05-08 DIAGNOSIS — J301 Allergic rhinitis due to pollen: Secondary | ICD-10-CM | POA: Diagnosis not present

## 2022-05-14 DIAGNOSIS — J301 Allergic rhinitis due to pollen: Secondary | ICD-10-CM | POA: Diagnosis not present

## 2022-05-16 ENCOUNTER — Encounter: Payer: 59 | Admitting: Nurse Practitioner

## 2022-05-16 DIAGNOSIS — J301 Allergic rhinitis due to pollen: Secondary | ICD-10-CM | POA: Diagnosis not present

## 2022-05-21 DIAGNOSIS — J301 Allergic rhinitis due to pollen: Secondary | ICD-10-CM | POA: Diagnosis not present

## 2022-06-05 ENCOUNTER — Ambulatory Visit: Payer: 59 | Admitting: Dermatology

## 2022-06-12 ENCOUNTER — Encounter: Payer: Self-pay | Admitting: Nurse Practitioner

## 2022-06-12 ENCOUNTER — Ambulatory Visit (INDEPENDENT_AMBULATORY_CARE_PROVIDER_SITE_OTHER): Payer: 59 | Admitting: Nurse Practitioner

## 2022-06-12 VITALS — BP 128/78 | HR 87 | Temp 97.8°F | Ht 63.0 in | Wt 165.1 lb

## 2022-06-12 DIAGNOSIS — I2510 Atherosclerotic heart disease of native coronary artery without angina pectoris: Secondary | ICD-10-CM | POA: Diagnosis not present

## 2022-06-12 DIAGNOSIS — F419 Anxiety disorder, unspecified: Secondary | ICD-10-CM | POA: Diagnosis not present

## 2022-06-12 DIAGNOSIS — I10 Essential (primary) hypertension: Secondary | ICD-10-CM

## 2022-06-12 DIAGNOSIS — F32A Depression, unspecified: Secondary | ICD-10-CM | POA: Diagnosis not present

## 2022-06-12 MED ORDER — DIAZEPAM 5 MG PO TABS: 5.0000 mg | ORAL_TABLET | Freq: Two times a day (BID) | ORAL | 0 refills | Status: DC

## 2022-06-12 MED ORDER — ATORVASTATIN CALCIUM 40 MG PO TABS: 40.0000 mg | ORAL_TABLET | Freq: Every day | ORAL | 3 refills | Status: DC

## 2022-06-12 MED ORDER — AMLODIPINE BESYLATE 5 MG PO TABS: 5.0000 mg | ORAL_TABLET | Freq: Every day | ORAL | 3 refills | Status: DC

## 2022-06-12 NOTE — Assessment & Plan Note (Addendum)
Stable at present. Continue statin therapy for cardiovascular risk reduction.
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
## 2022-06-12 NOTE — Patient Instructions (Signed)
Please schedule an appointment for fasting labs. Referral sent to cardiology. Refilled medication.

## 2022-06-17 NOTE — Assessment & Plan Note (Addendum)
Stable on medication. Refilled Valium 5 mg twice a day.

## 2022-06-17 NOTE — Assessment & Plan Note (Signed)
Patient BP  Vitals:   06/12/22 1415  BP: 128/78    in the office. Advised pt to follow a low sodium and heart healthy diet. Continue amlodipine 5 mg and losartan 100 mg daily.

## 2022-06-19 ENCOUNTER — Encounter: Payer: Self-pay | Admitting: Dermatology

## 2022-06-24 ENCOUNTER — Other Ambulatory Visit (INDEPENDENT_AMBULATORY_CARE_PROVIDER_SITE_OTHER): Payer: 59

## 2022-06-24 DIAGNOSIS — I10 Essential (primary) hypertension: Secondary | ICD-10-CM | POA: Diagnosis not present

## 2022-06-24 LAB — LIPID PANEL
Cholesterol: 162 mg/dL (ref 0–200)
HDL: 63.1 mg/dL (ref >39.00)
LDL Cholesterol: 79 mg/dL (ref 0–99)
NonHDL: 99.37
Total CHOL/HDL Ratio: 3
Triglycerides: 103 mg/dL (ref 0.0–149.0)
VLDL: 20.6 mg/dL (ref 0.0–40.0)

## 2022-06-24 LAB — COMPREHENSIVE METABOLIC PANEL
ALT: 20 U/L (ref 0–35)
AST: 16 U/L (ref 0–37)
Albumin: 4.2 g/dL (ref 3.5–5.2)
Alkaline Phosphatase: 72 U/L (ref 39–117)
BUN: 19 mg/dL (ref 6–23)
CO2: 27 mEq/L (ref 19–32)
Calcium: 9.5 mg/dL (ref 8.4–10.5)
Chloride: 105 mEq/L (ref 96–112)
Creatinine, Ser: 1.11 mg/dL (ref 0.40–1.20)
GFR: 53.81 mL/min — ABNORMAL LOW (ref >60.00)
Glucose, Bld: 97 mg/dL (ref 70–99)
Potassium: 4.1 mEq/L (ref 3.5–5.1)
Sodium: 143 mEq/L (ref 135–145)
Total Bilirubin: 0.4 mg/dL (ref 0.2–1.2)
Total Protein: 6.9 g/dL (ref 6.0–8.3)

## 2022-06-24 LAB — CBC WITH DIFFERENTIAL/PLATELET
Basophils Absolute: 0.1 10*3/uL (ref 0.0–0.1)
Basophils Relative: 0.7 % (ref 0.0–3.0)
Eosinophils Absolute: 0.4 10*3/uL (ref 0.0–0.7)
Eosinophils Relative: 5.1 % — ABNORMAL HIGH (ref 0.0–5.0)
HCT: 43.6 % (ref 36.0–46.0)
Hemoglobin: 14.7 g/dL (ref 12.0–15.0)
Lymphocytes Relative: 27.5 % (ref 12.0–46.0)
Lymphs Abs: 2.3 10*3/uL (ref 0.7–4.0)
MCHC: 33.7 g/dL (ref 30.0–36.0)
MCV: 93.9 fl (ref 78.0–100.0)
Monocytes Absolute: 0.4 10*3/uL (ref 0.1–1.0)
Monocytes Relative: 5.2 % (ref 3.0–12.0)
Neutro Abs: 5.2 10*3/uL (ref 1.4–7.7)
Neutrophils Relative %: 61.5 % (ref 43.0–77.0)
Platelets: 310 10*3/uL (ref 150.0–400.0)
RBC: 4.64 Mil/uL (ref 3.87–5.11)
RDW: 13.2 % (ref 11.5–15.5)
WBC: 8.4 10*3/uL (ref 4.0–10.5)

## 2022-06-24 LAB — TSH: TSH: 2.53 u[IU]/mL (ref 0.35–5.50)

## 2022-06-26 ENCOUNTER — Ambulatory Visit: Payer: 59 | Admitting: Dermatology

## 2022-06-27 ENCOUNTER — Telehealth: Payer: Self-pay

## 2022-06-27 NOTE — Patient Outreach (Signed)
  Care Coordination   Initial Visit Note   06/27/2022 Name: AYDA TANCREDI MRN: [id number] DOB: 11/19/1960  Lakedra Washington Golson is a 62 y.o. year old female who sees Theresia Lo, NP for primary care. I spoke with  Santa Genera Trenkamp by phone today.  What matters to the patients health and wellness today?  Patient states she is managing her health conditions at this time. She states she follows up with her providers regularly and has transportation to get to her appointments.   Patient states he is able to afford her medications and takes them as prescribed. Patient states she needs to start exercising and being more physically active.  Patient denied having any nursing or community resource needs at this time.     Goals Addressed             This Visit's Progress    COMPLETED: Care coordination activites - no follow up needed       Interventions Today    Flowsheet Row Most Recent Value  General Interventions   General Interventions Discussed/Reviewed General Interventions Discussed, Doctor Visits  [care coordination services discussed and offered. SDOH assessment completed.  Annual wellness exam explained.  Patient advised to schedule with provider office. Advised to contact provider or RNCM if services needed in the furture. Phone number provided.]  Doctor Visits Discussed/Reviewed --  [Discussed importance of consistent follow up with providers.]  Exercise Interventions   Exercise Discussed/Reviewed Exercise Discussed  [Discussed making small attainable goals for exercise.  Discussed importance of increasing activity.  Advised to get up from desk every 1-2 hours and walk.  Mailed patient [facility] exercise booklet.]  Pharmacy Interventions   Pharmacy Dicussed/Reviewed Pharmacy Topics Discussed  [Confirmed patient able to afford medications and taking medications as prescribed.]              SDOH assessments and interventions completed:  Yes  SDOH Interventions Today    Flowsheet Row  Most Recent Value  SDOH Interventions   Food Insecurity Interventions Intervention Not Indicated  Housing Interventions Intervention Not Indicated  Transportation Interventions Intervention Not Indicated        Care Coordination Interventions:  Yes, provided   Follow up plan: No further intervention required.   Encounter Outcome:  Pt. Visit Completed   Quinn Plowman RN,BSN,CCM [place] [phone] direct line

## 2022-07-24 ENCOUNTER — Other Ambulatory Visit: Payer: Self-pay | Admitting: Nurse Practitioner

## 2022-07-24 DIAGNOSIS — I10 Essential (primary) hypertension: Secondary | ICD-10-CM

## 2022-07-25 NOTE — Telephone Encounter (Signed)
Requesting: Diazepam 5 mg Contract: No UDS: No Last Visit: 06/12/2022 Next Visit: 10/10/2022 Last Refill: 06/12/22  Please Advise

## 2022-07-30 ENCOUNTER — Encounter: Payer: Self-pay | Admitting: Nurse Practitioner

## 2022-07-30 DIAGNOSIS — J4520 Mild intermittent asthma, uncomplicated: Secondary | ICD-10-CM

## 2022-07-31 ENCOUNTER — Other Ambulatory Visit: Payer: Self-pay | Admitting: Nurse Practitioner

## 2022-07-31 MED ORDER — FLUTICASONE-SALMETEROL 45-21 MCG/ACT IN AERO
2.0000 | INHALATION_SPRAY | Freq: Two times a day (BID) | RESPIRATORY_TRACT | 5 refills | Status: DC
Start: 2022-07-31 — End: 2022-08-10

## 2022-08-05 ENCOUNTER — Ambulatory Visit: Payer: 59 | Admitting: Internal Medicine

## 2022-08-05 DIAGNOSIS — J301 Allergic rhinitis due to pollen: Secondary | ICD-10-CM | POA: Diagnosis not present

## 2022-08-06 NOTE — Telephone Encounter (Signed)
Please call the patient and check with her how started her on furosemide and why? It has not been refilled is last 2 year.

## 2022-08-10 ENCOUNTER — Other Ambulatory Visit: Payer: Self-pay | Admitting: Nurse Practitioner

## 2022-08-10 MED ORDER — FLUTICASONE-SALMETEROL 100-50 MCG/ACT IN AEPB: 1.0000 | INHALATION_SPRAY | Freq: Two times a day (BID) | RESPIRATORY_TRACT | 3 refills | Status: DC

## 2022-08-13 DIAGNOSIS — J301 Allergic rhinitis due to pollen: Secondary | ICD-10-CM | POA: Diagnosis not present

## 2022-08-15 ENCOUNTER — Encounter (INDEPENDENT_AMBULATORY_CARE_PROVIDER_SITE_OTHER): Payer: Self-pay | Admitting: Vascular Surgery

## 2022-08-15 ENCOUNTER — Ambulatory Visit (INDEPENDENT_AMBULATORY_CARE_PROVIDER_SITE_OTHER): Payer: 59 | Admitting: Vascular Surgery

## 2022-08-15 ENCOUNTER — Ambulatory Visit (INDEPENDENT_AMBULATORY_CARE_PROVIDER_SITE_OTHER): Payer: 59

## 2022-08-15 ENCOUNTER — Other Ambulatory Visit (INDEPENDENT_AMBULATORY_CARE_PROVIDER_SITE_OTHER): Payer: Self-pay | Admitting: Vascular Surgery

## 2022-08-15 VITALS — BP 112/76 | HR 83 | Resp 18 | Ht 63.0 in | Wt 164.2 lb

## 2022-08-15 DIAGNOSIS — I10 Essential (primary) hypertension: Secondary | ICD-10-CM

## 2022-08-15 DIAGNOSIS — M7989 Other specified soft tissue disorders: Secondary | ICD-10-CM | POA: Diagnosis not present

## 2022-08-15 DIAGNOSIS — I739 Peripheral vascular disease, unspecified: Secondary | ICD-10-CM

## 2022-08-15 DIAGNOSIS — I6523 Occlusion and stenosis of bilateral carotid arteries: Secondary | ICD-10-CM

## 2022-08-15 DIAGNOSIS — E785 Hyperlipidemia, unspecified: Secondary | ICD-10-CM

## 2022-08-15 MED ORDER — FUROSEMIDE 20 MG PO TABS: 10.0000 mg | ORAL_TABLET | Freq: Every day | ORAL | 1 refills | Status: DC

## 2022-08-15 NOTE — Assessment & Plan Note (Signed)
ABIs today are in the normal range bilaterally at 1.08 on the right and 1.13 on the left with triphasic waveforms and normal digital pressures.  Doing well after revascularization.  Continue current medications.  Recheck in 1 year.
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
## 2022-08-15 NOTE — Assessment & Plan Note (Signed)
No new symptoms.  Being checked every other year.

## 2022-08-15 NOTE — Telephone Encounter (Signed)
Spoke with pt and she stated that Dr. Lewis Moccasin was prescribing this for her and she has been on if for many years. Pt stated that she is needing a refill. She also stated that some days she takes half tablet and sometimes she takes a whole tablet.

## 2022-08-15 NOTE — Assessment & Plan Note (Signed)
Seems to be worsening in the left leg.  Not wearing compression socks.  A prescription was given for compression socks today.  Discussed elevation and activity.  If her symptoms worsen, venous reflux study can be checked in the near future at her convenience.

## 2022-08-18 LAB — VAS US ABI WITH/WO TBI
Left ABI: 1.13
Right ABI: 1.08

## 2022-08-20 DIAGNOSIS — J309 Allergic rhinitis, unspecified: Secondary | ICD-10-CM | POA: Diagnosis not present

## 2022-08-22 ENCOUNTER — Other Ambulatory Visit: Payer: Self-pay | Admitting: Internal Medicine

## 2022-08-22 DIAGNOSIS — J4520 Mild intermittent asthma, uncomplicated: Secondary | ICD-10-CM

## 2022-08-22 NOTE — Telephone Encounter (Signed)
Ok to send

## 2022-08-29 ENCOUNTER — Ambulatory Visit: Payer: 59 | Admitting: Cardiovascular Disease

## 2022-09-02 ENCOUNTER — Ambulatory Visit: Payer: 59 | Admitting: Nurse Practitioner

## 2022-09-04 ENCOUNTER — Encounter: Payer: Self-pay | Admitting: Internal Medicine

## 2022-09-04 ENCOUNTER — Other Ambulatory Visit: Payer: Self-pay

## 2022-09-04 MED ORDER — ALBUTEROL SULFATE HFA 108 (90 BASE) MCG/ACT IN AERS: INHALATION_SPRAY | RESPIRATORY_TRACT | 0 refills | Status: DC

## 2022-09-17 DIAGNOSIS — F419 Anxiety disorder, unspecified: Secondary | ICD-10-CM | POA: Diagnosis not present

## 2022-09-17 DIAGNOSIS — Z20822 Contact with and (suspected) exposure to covid-19: Secondary | ICD-10-CM | POA: Diagnosis not present

## 2022-09-17 DIAGNOSIS — R059 Cough, unspecified: Secondary | ICD-10-CM | POA: Diagnosis not present

## 2022-09-18 ENCOUNTER — Other Ambulatory Visit: Payer: Self-pay | Admitting: Nurse Practitioner

## 2022-09-18 DIAGNOSIS — I10 Essential (primary) hypertension: Secondary | ICD-10-CM

## 2022-09-25 ENCOUNTER — Encounter: Payer: Self-pay | Admitting: Nurse Practitioner

## 2022-09-25 ENCOUNTER — Ambulatory Visit: Payer: 59 | Admitting: Nurse Practitioner

## 2022-09-25 VITALS — BP 130/74 | HR 78 | Temp 98.3°F | Resp 16 | Ht 63.0 in | Wt 165.6 lb

## 2022-09-25 DIAGNOSIS — Z8616 Personal history of COVID-19: Secondary | ICD-10-CM | POA: Diagnosis not present

## 2022-09-25 DIAGNOSIS — G9339 Other post infection and related fatigue syndromes: Secondary | ICD-10-CM

## 2022-09-25 DIAGNOSIS — G4719 Other hypersomnia: Secondary | ICD-10-CM

## 2022-09-25 DIAGNOSIS — J4489 Other specified chronic obstructive pulmonary disease: Secondary | ICD-10-CM | POA: Diagnosis not present

## 2022-09-25 DIAGNOSIS — Z87891 Personal history of nicotine dependence: Secondary | ICD-10-CM

## 2022-09-25 MED ORDER — BUDESONIDE 0.5 MG/2ML IN SUSP
0.5000 mg | Freq: Every day | RESPIRATORY_TRACT | 5 refills | Status: AC | PRN
Start: 2022-09-25 — End: ?

## 2022-09-25 MED ORDER — FLUTICASONE-SALMETEROL 100-50 MCG/ACT IN AEPB: 1.0000 | INHALATION_SPRAY | Freq: Two times a day (BID) | RESPIRATORY_TRACT | 5 refills | Status: DC

## 2022-09-25 MED ORDER — LEVALBUTEROL HCL 1.25 MG/0.5ML IN NEBU
1.2500 mg | INHALATION_SOLUTION | Freq: Four times a day (QID) | RESPIRATORY_TRACT | 3 refills | Status: AC | PRN
Start: 2022-09-25 — End: ?
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
## 2022-09-27 ENCOUNTER — Encounter: Payer: Self-pay | Admitting: Nurse Practitioner

## 2022-09-29 ENCOUNTER — Encounter: Payer: Self-pay | Admitting: Nurse Practitioner

## 2022-09-29 ENCOUNTER — Telehealth: Payer: Self-pay | Admitting: Nurse Practitioner

## 2022-09-29 NOTE — Telephone Encounter (Signed)
Office notes faxed to [facility] for chest CT pa; [phone]-Toni

## 2022-09-30 ENCOUNTER — Other Ambulatory Visit: Payer: Self-pay | Admitting: Internal Medicine

## 2022-09-30 MED ORDER — PREDNISONE 5 MG PO TABS: ORAL_TABLET | ORAL | 0 refills | Status: DC

## 2022-09-30 NOTE — Telephone Encounter (Signed)
Prednisone is sent, however pt will need to be seen if continues to have sob

## 2022-09-30 NOTE — Telephone Encounter (Signed)
Spoke with pt and we send prednisone if she continue having sob she need appt with dr Freda Munro

## 2022-10-03 NOTE — Addendum Note (Signed)
Addended by: Sallyanne Kuster on: 10/03/2022 07:56 AM   Modules accepted: Orders

## 2022-10-08 ENCOUNTER — Ambulatory Visit: Payer: 59 | Admitting: Dermatology

## 2022-10-10 ENCOUNTER — Ambulatory Visit
Admission: RE | Admit: 2022-10-10 | Discharge: 2022-10-10 | Disposition: A | Discharge status: Home / Self Care | Payer: 59 | Source: Ambulatory Visit | Attending: Nurse Practitioner | Admitting: Nurse Practitioner

## 2022-10-10 ENCOUNTER — Ambulatory Visit: Payer: 59 | Admitting: Nurse Practitioner

## 2022-10-10 DIAGNOSIS — Z87891 Personal history of nicotine dependence: Secondary | ICD-10-CM | POA: Diagnosis not present

## 2022-10-21 DIAGNOSIS — J301 Allergic rhinitis due to pollen: Secondary | ICD-10-CM | POA: Diagnosis not present

## 2022-10-27 ENCOUNTER — Other Ambulatory Visit: Payer: Self-pay | Admitting: Internal Medicine

## 2022-10-27 DIAGNOSIS — J0140 Acute pansinusitis, unspecified: Secondary | ICD-10-CM | POA: Diagnosis not present

## 2022-10-27 DIAGNOSIS — Z681 Body mass index (BMI) 19 or less, adult: Secondary | ICD-10-CM | POA: Diagnosis not present

## 2022-10-27 DIAGNOSIS — I10 Essential (primary) hypertension: Secondary | ICD-10-CM | POA: Diagnosis not present

## 2022-10-27 MED ORDER — PREDNISONE 5 MG PO TABS: ORAL_TABLET | ORAL | 0 refills | Status: DC

## 2022-10-29 ENCOUNTER — Ambulatory Visit: Payer: 59 | Admitting: Dermatology

## 2022-10-30 ENCOUNTER — Ambulatory Visit: Payer: 59 | Admitting: Cardiovascular Disease

## 2022-10-31 ENCOUNTER — Other Ambulatory Visit: Payer: Self-pay | Admitting: Nurse Practitioner

## 2022-10-31 ENCOUNTER — Telehealth: Payer: Self-pay | Admitting: Nurse Practitioner

## 2022-10-31 NOTE — Progress Notes (Signed)
Hello [name],  Thank you so much.

## 2022-11-03 ENCOUNTER — Encounter: Payer: Self-pay | Admitting: *Deleted

## 2022-11-03 NOTE — Telephone Encounter (Signed)
 Sent mychart message

## 2022-11-04 ENCOUNTER — Encounter: Payer: Self-pay | Admitting: Nurse Practitioner

## 2022-11-04 ENCOUNTER — Encounter: Payer: Self-pay | Admitting: Gastroenterology

## 2022-11-06 ENCOUNTER — Encounter: Payer: Self-pay | Admitting: Nurse Practitioner

## 2022-11-06 DIAGNOSIS — J301 Allergic rhinitis due to pollen: Secondary | ICD-10-CM | POA: Diagnosis not present

## 2022-11-06 NOTE — Telephone Encounter (Signed)
PT has not scheduled & has a h/o of no shows and cancellations.  Will route to front office for assistance

## 2022-11-06 NOTE — Telephone Encounter (Signed)
Last read by Reche Dixon Steinborn at 12:31 PM on 11/03/2022.

## 2022-11-06 NOTE — Telephone Encounter (Signed)
I left a voicemail for patient asking her to please call us and I sent a letter to patient via MyChart asking her to please call us to schedule a follow-up appointment.

## 2022-11-11 ENCOUNTER — Telehealth: Payer: Self-pay | Admitting: Nurse Practitioner

## 2022-11-11 NOTE — Telephone Encounter (Signed)
Lvm to schedule f/u with [facility] to review home sleep study scheduled for 12/09/22-[name]

## 2022-11-11 NOTE — Telephone Encounter (Signed)
SS appointment 12/09/2022 @ [name]

## 2022-11-12 DIAGNOSIS — I10 Essential (primary) hypertension: Secondary | ICD-10-CM | POA: Diagnosis not present

## 2022-11-12 DIAGNOSIS — Z20822 Contact with and (suspected) exposure to covid-19: Secondary | ICD-10-CM | POA: Diagnosis not present

## 2022-11-12 DIAGNOSIS — R059 Cough, unspecified: Secondary | ICD-10-CM | POA: Diagnosis not present

## 2022-11-12 DIAGNOSIS — F419 Anxiety disorder, unspecified: Secondary | ICD-10-CM | POA: Diagnosis not present

## 2022-11-12 DIAGNOSIS — R0602 Shortness of breath: Secondary | ICD-10-CM | POA: Diagnosis not present

## 2022-11-14 ENCOUNTER — Other Ambulatory Visit: Payer: Self-pay

## 2022-11-14 MED ORDER — ALBUTEROL SULFATE HFA 108 (90 BASE) MCG/ACT IN AERS: INHALATION_SPRAY | RESPIRATORY_TRACT | 1 refills | Status: DC

## 2022-11-17 ENCOUNTER — Encounter: Payer: Self-pay | Admitting: Nurse Practitioner

## 2022-11-17 ENCOUNTER — Ambulatory Visit: Payer: 59 | Admitting: Nurse Practitioner

## 2022-11-17 VITALS — BP 125/88 | HR 92 | Temp 98.3°F | Resp 16 | Ht 63.0 in | Wt 165.3 lb

## 2022-11-17 DIAGNOSIS — R0602 Shortness of breath: Secondary | ICD-10-CM

## 2022-11-17 DIAGNOSIS — I2510 Atherosclerotic heart disease of native coronary artery without angina pectoris: Secondary | ICD-10-CM

## 2022-11-17 DIAGNOSIS — I700 Atherosclerosis of aorta: Secondary | ICD-10-CM

## 2022-11-17 DIAGNOSIS — J432 Centrilobular emphysema: Secondary | ICD-10-CM

## 2022-11-17 MED ORDER — IPRATROPIUM-ALBUTEROL 0.5-2.5 (3) MG/3ML IN SOLN
3.0000 mL | Freq: Once | RESPIRATORY_TRACT | Status: AC
Start: 2022-11-17 — End: 2022-11-17
  Administered 2022-11-17: 3 mL via RESPIRATORY_TRACT

## 2022-11-17 MED ORDER — METHYLPREDNISOLONE ACETATE 80 MG/ML IJ SUSP
80.0000 mg | Freq: Once | INTRAMUSCULAR | Status: AC
Start: 2022-11-17 — End: 2022-11-17
  Administered 2022-11-17: 40 mg via INTRAMUSCULAR

## 2022-11-17 MED ORDER — FLUTICASONE-SALMETEROL 250-50 MCG/ACT IN AEPB
1.0000 | INHALATION_SPRAY | Freq: Two times a day (BID) | RESPIRATORY_TRACT | 5 refills | Status: DC
Start: 2022-11-17 — End: 2023-04-27
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
## 2022-11-19 DIAGNOSIS — Z681 Body mass index (BMI) 19 or less, adult: Secondary | ICD-10-CM | POA: Diagnosis not present

## 2022-11-19 DIAGNOSIS — R300 Dysuria: Secondary | ICD-10-CM | POA: Diagnosis not present

## 2022-11-19 DIAGNOSIS — R350 Frequency of micturition: Secondary | ICD-10-CM | POA: Diagnosis not present

## 2022-11-20 ENCOUNTER — Ambulatory Visit: Payer: 59 | Attending: Cardiovascular Disease | Admitting: Cardiovascular Disease

## 2022-11-20 ENCOUNTER — Encounter: Payer: Self-pay | Admitting: Cardiovascular Disease

## 2022-11-20 VITALS — BP 146/83 | HR 72 | Ht 63.0 in | Wt 164.4 lb

## 2022-11-20 DIAGNOSIS — E785 Hyperlipidemia, unspecified: Secondary | ICD-10-CM | POA: Diagnosis not present

## 2022-11-20 DIAGNOSIS — I25118 Atherosclerotic heart disease of native coronary artery with other forms of angina pectoris: Secondary | ICD-10-CM | POA: Diagnosis not present

## 2022-11-20 DIAGNOSIS — R0602 Shortness of breath: Secondary | ICD-10-CM

## 2022-11-20 DIAGNOSIS — I10 Essential (primary) hypertension: Secondary | ICD-10-CM

## 2022-11-20 DIAGNOSIS — R072 Precordial pain: Secondary | ICD-10-CM | POA: Diagnosis not present

## 2022-11-20 DIAGNOSIS — Z8673 Personal history of transient ischemic attack (TIA), and cerebral infarction without residual deficits: Secondary | ICD-10-CM

## 2022-11-20 DIAGNOSIS — I739 Peripheral vascular disease, unspecified: Secondary | ICD-10-CM | POA: Diagnosis not present
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
## 2022-11-20 NOTE — Patient Instructions (Signed)
Medication Instructions:  No changes *If you need a refill on your cardiac medications before your next appointment, please call your pharmacy*   Lab Work: None ordered If you have labs (blood work) drawn today and your tests are completely normal, you will receive your results only by: MyChart Message (if you have MyChart) OR A paper copy in the mail If you have any lab test that is abnormal or we need to change your treatment, we will call you to review the results.   Testing/Procedures: Your physician has requested that you have an echocardiogram. Echocardiography is a painless test that uses sound waves to create images of your heart. It provides your doctor with information about the size and shape of your heart and how well your heart's chambers and valves are working.   You may receive an ultrasound enhancing agent through an IV if needed to better visualize your heart during the echo. This procedure takes approximately one hour.  There are no restrictions for this procedure.  This will take place at [address] [address], [place] [address]  Your provider has ordered a Lexiscan Myoview Stress test. This will take place at [facility]. Please report to the [facility] medical mall entrance. The volunteers at the first desk will direct you where to go.  [facility] MYOVIEW  Your provider has ordered a Stress Test with nuclear imaging. The purpose of this test is to evaluate the blood supply to your heart muscle. This procedure is referred to as a "Non-Invasive Stress Test." This is because other than having an IV started in your vein, nothing is inserted or "invades" your body. Cardiac stress tests are done to find areas of poor blood flow to the heart by determining the extent of coronary artery disease (CAD). Some patients exercise on a treadmill, which naturally increases the blood flow to your heart, while others who are unable to walk on a treadmill due to physical limitations  will have a pharmacologic/chemical stress agent called Lexiscan . This medicine will mimic walking on a treadmill by temporarily increasing your coronary blood flow.   Please note: these test may take anywhere between 2-4 hours to complete  How to prepare for your Myoview test:  Nothing to eat for 6 hours prior to the test No caffeine for 24 hours prior to test No smoking 24 hours prior to test. Your medication may be taken with water.  If your doctor stopped a medication because of this test, do not take that medication. Ladies, please do not wear dresses.  Skirts or pants are appropriate. Please wear a short sleeve shirt. No perfume, cologne or lotion. Wear comfortable walking shoes. No heels!   PLEASE NOTIFY THE OFFICE AT LEAST 24 HOURS IN ADVANCE IF YOU ARE UNABLE TO KEEP YOUR APPOINTMENT.  [phone] AND  PLEASE NOTIFY NUCLEAR MEDICINE AT [facility] AT LEAST 24 HOURS IN ADVANCE IF YOU ARE UNABLE TO KEEP YOUR APPOINTMENT. [phone]    Follow-Up: At [facility], you and your health needs are our priority.  As part of our continuing mission to provide you with exceptional heart care, we have created designated Provider Care Teams.  These Care Teams include your primary Cardiologist (physician) and Advanced Practice Providers (APPs -  Physician Assistants and Nurse Practitioners) who all work together to provide you with the care you need, when you need it.  We recommend signing up for the patient portal called "MyChart".  Sign up information is provided on this After  Visit Summary.  MyChart is used to connect with patients for Virtual Visits (Telemedicine).  Patients are able to view lab/test results, encounter notes, upcoming appointments, etc.  Non-urgent messages can be sent to your provider as well.   To learn more about what you can do with MyChart, go to [email or web address].    Your next appointment:   1 month(s)  Provider:   You may see Dr. Kirke Corin or one of the  following Advanced Practice Providers on your designated Care Team:   Nicolasa Ducking, NP Eula Listen, PA-C Cadence Fransico Michael, PA-C Charlsie Quest, NP

## 2022-11-21 ENCOUNTER — Other Ambulatory Visit: Payer: Self-pay

## 2022-11-22 ENCOUNTER — Encounter: Payer: Self-pay | Admitting: Nurse Practitioner

## 2022-11-24 ENCOUNTER — Encounter: Payer: Self-pay | Admitting: Nurse Practitioner

## 2022-11-26 ENCOUNTER — Other Ambulatory Visit: Payer: Self-pay

## 2022-11-26 ENCOUNTER — Encounter: Payer: Self-pay | Admitting: Gastroenterology

## 2022-11-26 MED ORDER — PREDNISONE 5 MG PO TABS: ORAL_TABLET | ORAL | 0 refills | Status: DC

## 2022-11-27 ENCOUNTER — Ambulatory Visit: Payer: 59 | Admitting: Gastroenterology

## 2022-11-27 DIAGNOSIS — Z681 Body mass index (BMI) 19 or less, adult: Secondary | ICD-10-CM | POA: Diagnosis not present

## 2022-11-27 DIAGNOSIS — R3915 Urgency of urination: Secondary | ICD-10-CM | POA: Diagnosis not present

## 2022-11-27 DIAGNOSIS — R350 Frequency of micturition: Secondary | ICD-10-CM | POA: Diagnosis not present

## 2022-11-27 DIAGNOSIS — R300 Dysuria: Secondary | ICD-10-CM | POA: Diagnosis not present

## 2022-11-28 ENCOUNTER — Ambulatory Visit
Admission: RE | Admit: 2022-11-28 | Discharge: 2022-11-28 | Disposition: A | Discharge status: Home / Self Care | Payer: 59 | Source: Ambulatory Visit | Attending: Cardiovascular Disease | Admitting: Cardiovascular Disease

## 2022-11-28 DIAGNOSIS — R072 Precordial pain: Secondary | ICD-10-CM | POA: Diagnosis not present

## 2022-11-28 DIAGNOSIS — I700 Atherosclerosis of aorta: Secondary | ICD-10-CM | POA: Diagnosis not present

## 2022-11-28 MED ORDER — TECHNETIUM TC 99M TETROFOSMIN IV KIT
10.0000 | PACK | Freq: Once | INTRAVENOUS | Status: AC | PRN
  Administered 2022-11-28: 9.98 via INTRAVENOUS

## 2022-11-28 MED ORDER — REGADENOSON 0.4 MG/5ML IV SOLN
0.4000 mg | Freq: Once | INTRAVENOUS | Status: AC
  Administered 2022-11-28: 0.4 mg via INTRAVENOUS

## 2022-11-28 MED ORDER — TECHNETIUM TC 99M TETROFOSMIN IV KIT
27.6100 | PACK | Freq: Once | INTRAVENOUS | Status: AC | PRN
  Administered 2022-11-28: 27.61 via INTRAVENOUS

## 2022-12-07 DIAGNOSIS — R300 Dysuria: Secondary | ICD-10-CM | POA: Diagnosis not present

## 2022-12-10 ENCOUNTER — Encounter: Payer: Self-pay | Admitting: Nurse Practitioner

## 2022-12-11 ENCOUNTER — Other Ambulatory Visit: Payer: 59

## 2022-12-12 ENCOUNTER — Telehealth: Payer: Self-pay | Admitting: Nurse Practitioner

## 2022-12-12 ENCOUNTER — Telehealth: Payer: Self-pay | Admitting: Internal Medicine

## 2022-12-12 NOTE — Telephone Encounter (Signed)
Error

## 2022-12-12 NOTE — Telephone Encounter (Signed)
Called patient to schedule follow up to review 12/09/22 home SS. She stated study was not done due to her having uti. She will let me know when SS is rescheduled-Toni

## 2022-12-16 ENCOUNTER — Encounter: Payer: Self-pay | Admitting: Gastroenterology

## 2022-12-16 ENCOUNTER — Other Ambulatory Visit: Payer: Self-pay

## 2022-12-16 ENCOUNTER — Ambulatory Visit: Payer: 59 | Admitting: Gastroenterology

## 2022-12-16 VITALS — BP 138/84 | HR 83 | Temp 98.4°F | Ht 63.0 in | Wt 169.4 lb

## 2022-12-16 DIAGNOSIS — K642 Third degree hemorrhoids: Secondary | ICD-10-CM
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
## 2022-12-23 ENCOUNTER — Other Ambulatory Visit: Payer: Self-pay | Admitting: Nurse Practitioner

## 2022-12-23 DIAGNOSIS — I10 Essential (primary) hypertension: Secondary | ICD-10-CM

## 2022-12-24 ENCOUNTER — Ambulatory Visit: Payer: 59 | Attending: Cardiovascular Disease

## 2022-12-24 ENCOUNTER — Other Ambulatory Visit: Payer: Self-pay | Admitting: Cardiovascular Disease

## 2022-12-24 ENCOUNTER — Other Ambulatory Visit: Payer: 59

## 2022-12-24 DIAGNOSIS — I10 Essential (primary) hypertension: Secondary | ICD-10-CM

## 2022-12-24 DIAGNOSIS — R0602 Shortness of breath: Secondary | ICD-10-CM

## 2022-12-24 DIAGNOSIS — I088 Other rheumatic multiple valve diseases: Secondary | ICD-10-CM

## 2022-12-24 DIAGNOSIS — R072 Precordial pain: Secondary | ICD-10-CM

## 2022-12-24 DIAGNOSIS — Z8673 Personal history of transient ischemic attack (TIA), and cerebral infarction without residual deficits: Secondary | ICD-10-CM

## 2022-12-24 DIAGNOSIS — I739 Peripheral vascular disease, unspecified: Secondary | ICD-10-CM

## 2022-12-24 DIAGNOSIS — I25118 Atherosclerotic heart disease of native coronary artery with other forms of angina pectoris: Secondary | ICD-10-CM

## 2022-12-24 DIAGNOSIS — E785 Hyperlipidemia, unspecified: Secondary | ICD-10-CM

## 2022-12-24 DIAGNOSIS — I5030 Unspecified diastolic (congestive) heart failure: Secondary | ICD-10-CM | POA: Diagnosis not present

## 2022-12-24 LAB — ECHOCARDIOGRAM COMPLETE
Area-P 1/2: 3.12 cm2
S' Lateral: 2.8 cm

## 2022-12-26 ENCOUNTER — Ambulatory Visit (INDEPENDENT_AMBULATORY_CARE_PROVIDER_SITE_OTHER): Payer: 59 | Admitting: Nurse Practitioner

## 2022-12-26 ENCOUNTER — Encounter: Payer: Self-pay | Admitting: Nurse Practitioner

## 2022-12-26 VITALS — BP 124/74 | HR 87 | Temp 97.6°F | Ht 63.0 in | Wt 166.0 lb

## 2022-12-26 DIAGNOSIS — E785 Hyperlipidemia, unspecified: Secondary | ICD-10-CM | POA: Diagnosis not present

## 2022-12-26 DIAGNOSIS — I10 Essential (primary) hypertension: Secondary | ICD-10-CM

## 2022-12-26 DIAGNOSIS — F419 Anxiety disorder, unspecified: Secondary | ICD-10-CM | POA: Diagnosis not present

## 2022-12-26 DIAGNOSIS — F32A Depression, unspecified: Secondary | ICD-10-CM

## 2022-12-26 DIAGNOSIS — Z23 Encounter for immunization: Secondary | ICD-10-CM

## 2022-12-26 LAB — CBC WITH DIFFERENTIAL/PLATELET
Basophils Absolute: 0.1 10*3/uL (ref 0.0–0.1)
Basophils Relative: 0.6 % (ref 0.0–3.0)
Eosinophils Absolute: 0.3 10*3/uL (ref 0.0–0.7)
Eosinophils Relative: 3.2 % (ref 0.0–5.0)
HCT: 41.2 % (ref 36.0–46.0)
Hemoglobin: 13.3 g/dL (ref 12.0–15.0)
Lymphocytes Relative: 13.7 % (ref 12.0–46.0)
Lymphs Abs: 1.4 10*3/uL (ref 0.7–4.0)
MCHC: 32.2 g/dL (ref 30.0–36.0)
MCV: 96.6 fl (ref 78.0–100.0)
Monocytes Absolute: 0.5 10*3/uL (ref 0.1–1.0)
Monocytes Relative: 5.2 % (ref 3.0–12.0)
Neutro Abs: 8 10*3/uL — ABNORMAL HIGH (ref 1.4–7.7)
Neutrophils Relative %: 77.3 % — ABNORMAL HIGH (ref 43.0–77.0)
Platelets: 378 10*3/uL (ref 150.0–400.0)
RBC: 4.26 Mil/uL (ref 3.87–5.11)
RDW: 13.3 % (ref 11.5–15.5)
WBC: 10.3 10*3/uL (ref 4.0–10.5)

## 2022-12-26 LAB — LIPID PANEL
Cholesterol: 179 mg/dL (ref 0–200)
HDL: 69.6 mg/dL (ref >39.00)
LDL Cholesterol: 81 mg/dL (ref 0–99)
NonHDL: 109.77
Total CHOL/HDL Ratio: 3
Triglycerides: 143 mg/dL (ref 0.0–149.0)
VLDL: 28.6 mg/dL (ref 0.0–40.0)

## 2022-12-26 LAB — COMPREHENSIVE METABOLIC PANEL
ALT: 16 U/L (ref 0–35)
AST: 12 U/L (ref 0–37)
Albumin: 4.3 g/dL (ref 3.5–5.2)
Alkaline Phosphatase: 85 U/L (ref 39–117)
BUN: 16 mg/dL (ref 6–23)
CO2: 26 meq/L (ref 19–32)
Calcium: 9.6 mg/dL (ref 8.4–10.5)
Chloride: 105 meq/L (ref 96–112)
Creatinine, Ser: 1.16 mg/dL (ref 0.40–1.20)
GFR: 50.86 mL/min — ABNORMAL LOW (ref >60.00)
Glucose, Bld: 115 mg/dL — ABNORMAL HIGH (ref 70–99)
Potassium: 4.1 meq/L (ref 3.5–5.1)
Sodium: 141 meq/L (ref 135–145)
Total Bilirubin: 0.5 mg/dL (ref 0.2–1.2)
Total Protein: 7 g/dL (ref 6.0–8.3)

## 2022-12-26 LAB — TSH: TSH: 3.27 u[IU]/mL (ref 0.35–5.50)

## 2022-12-26 MED ORDER — DIAZEPAM 5 MG PO TABS: 5.0000 mg | ORAL_TABLET | Freq: Two times a day (BID) | ORAL | 2 refills | Status: DC | PRN

## 2022-12-26 MED ORDER — ONDANSETRON 4 MG PO TBDP: 4.0000 mg | ORAL_TABLET | Freq: Three times a day (TID) | ORAL | 0 refills | Status: DC | PRN

## 2022-12-26 MED ORDER — LOSARTAN POTASSIUM 100 MG PO TABS: 100.0000 mg | ORAL_TABLET | Freq: Every day | ORAL | 5 refills | Status: DC

## 2022-12-26 MED ORDER — BUPROPION HCL ER (XL) 300 MG PO TB24
300.0000 mg | ORAL_TABLET | Freq: Every day | ORAL | 3 refills | Status: DC
Start: 2022-12-26 — End: 2024-01-19

## 2022-12-26 NOTE — Patient Instructions (Signed)
Please go to the lab for blood work. Rx sent to the pharmacy.
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
## 2022-12-30 ENCOUNTER — Ambulatory Visit: Payer: 59 | Admitting: Physician Assistant

## 2022-12-31 ENCOUNTER — Other Ambulatory Visit: Payer: Self-pay | Admitting: Nurse Practitioner

## 2022-12-31 ENCOUNTER — Other Ambulatory Visit: Payer: 59

## 2023-01-02 ENCOUNTER — Other Ambulatory Visit: Payer: Self-pay | Admitting: Internal Medicine

## 2023-01-02 DIAGNOSIS — J4520 Mild intermittent asthma, uncomplicated: Secondary | ICD-10-CM

## 2023-01-06 ENCOUNTER — Ambulatory Visit: Payer: 59 | Admitting: Physician Assistant

## 2023-01-11 NOTE — Assessment & Plan Note (Signed)
Patient BP  Vitals:   12/26/22 0755  BP: 124/74    in the office. Advised pt to follow a low sodium and heart healthy diet. Continue amlodipine, diltiazem and losartan.

## 2023-01-11 NOTE — Assessment & Plan Note (Signed)
Continue statin therapy for cardiovascular risk reduction.

## 2023-01-11 NOTE — Assessment & Plan Note (Signed)
Stable on medication. Refilled Valium 5 mg twice a day as needed.

## 2023-01-18 ENCOUNTER — Encounter: Payer: Self-pay | Admitting: Nurse Practitioner

## 2023-01-23 ENCOUNTER — Other Ambulatory Visit: Payer: Self-pay | Admitting: Nurse Practitioner

## 2023-01-23 NOTE — Telephone Encounter (Signed)
Its ok to sent

## 2023-01-26 DIAGNOSIS — J301 Allergic rhinitis due to pollen: Secondary | ICD-10-CM | POA: Diagnosis not present

## 2023-01-27 ENCOUNTER — Encounter (INDEPENDENT_AMBULATORY_CARE_PROVIDER_SITE_OTHER): Payer: Self-pay | Admitting: Internal Medicine

## 2023-01-27 DIAGNOSIS — G4719 Other hypersomnia: Secondary | ICD-10-CM

## 2023-01-29 ENCOUNTER — Ambulatory Visit: Payer: 59 | Admitting: Cardiovascular Disease

## 2023-01-30 ENCOUNTER — Other Ambulatory Visit: Payer: Self-pay | Admitting: Nurse Practitioner

## 2023-02-02 ENCOUNTER — Encounter: Payer: Self-pay | Admitting: Gastroenterology

## 2023-02-03 ENCOUNTER — Ambulatory Visit (INDEPENDENT_AMBULATORY_CARE_PROVIDER_SITE_OTHER): Payer: 59

## 2023-02-03 ENCOUNTER — Other Ambulatory Visit: Payer: Self-pay | Admitting: Internal Medicine

## 2023-02-03 ENCOUNTER — Ambulatory Visit: Payer: 59 | Attending: Physician Assistant | Admitting: Physician Assistant

## 2023-02-03 ENCOUNTER — Encounter: Payer: Self-pay | Admitting: Physician Assistant

## 2023-02-03 VITALS — BP 138/92 | HR 77 | Ht 63.0 in | Wt 168.8 lb

## 2023-02-03 DIAGNOSIS — I2510 Atherosclerotic heart disease of native coronary artery without angina pectoris: Secondary | ICD-10-CM

## 2023-02-03 DIAGNOSIS — E785 Hyperlipidemia, unspecified: Secondary | ICD-10-CM | POA: Diagnosis not present

## 2023-02-03 DIAGNOSIS — G4730 Sleep apnea, unspecified: Secondary | ICD-10-CM

## 2023-02-03 DIAGNOSIS — I10 Essential (primary) hypertension: Secondary | ICD-10-CM

## 2023-02-03 DIAGNOSIS — R0602 Shortness of breath: Secondary | ICD-10-CM | POA: Diagnosis not present

## 2023-02-03 DIAGNOSIS — I739 Peripheral vascular disease, unspecified: Secondary | ICD-10-CM | POA: Diagnosis not present

## 2023-02-03 DIAGNOSIS — R002 Palpitations: Secondary | ICD-10-CM

## 2023-02-03 MED ORDER — ATORVASTATIN CALCIUM 80 MG PO TABS: 80.0000 mg | ORAL_TABLET | Freq: Every day | ORAL | 3 refills | Status: DC

## 2023-02-03 MED ORDER — AMLODIPINE BESYLATE 10 MG PO TABS: 10.0000 mg | ORAL_TABLET | Freq: Every day | ORAL | 3 refills | Status: DC
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
## 2023-02-03 NOTE — Patient Instructions (Signed)
Medication Instructions:  Your physician recommends the following medication changes.  STOP TAKING: Diltiazem  INCREASE: Amlodipine 10 mg daily Lipitor 80 mg daily   *If you need a refill on your cardiac medications before your next appointment, please call your pharmacy*   Testing/Procedures:  ZIO XT- Long Term Monitor Instructions  Your physician has requested you wear a ZIO patch monitor for 14 days.  This is a single patch monitor. [employer] supplies one patch monitor per enrollment. Additional stickers are not available. Please do not apply patch if you will be having a Nuclear Stress Test, Echocardiogram, Cardiac CT, MRI, or Chest Xray during the period you would be wearing the monitor. The patch cannot be worn during these tests. You cannot remove and re-apply the ZIO XT patch monitor.   Your ZIO patch monitor will be mailed 3 day USPS to your address on file. It may take 3-5 days to receive your monitor after you have been enrolled.   Once you have received your monitor, please review the enclosed instructions. Your monitor has already been registered assigning a specific monitor serial # to you.  Billing and Patient Assistance Program Information  We have supplied [employer] with any of your insurance information on file for billing purposes.  [employer] offers a sliding scale Patient Assistance Program for patients that do not have insurance, or whose insurance does not completely cover the cost of the ZIO monitor.   You must apply for the Patient Assistance Program to qualify for this discounted rate.   To apply, please call [employer] at [phone], select option 4, select option 2, ask to apply for Patient Assistance Program. Meredeth Ide will ask your household income, and how many people are in your household. They will quote your out-of-pocket cost based on that information.   [employer] will also be able to set up a 4-month, interest-free payment plan if needed.  Applying the  monitor  Shave hair from upper left chest.   Hold abrader disc by orange tab. Rub abrader in 40 strokes over the upper left chest as indicated in your monitor instructions.   Clean area with 4 enclosed alcohol pads. Let dry.   Apply patch as indicated in monitor instructions. Patch will be placed under collarbone on left side of chest with arrow pointing upward.   Rub patch adhesive wings for 2 minutes. Remove white label marked "1". Remove the white label marked "2". Rub patch adhesive wings for 2 additional minutes.   While looking in a mirror, press and release button in center of patch. A small green light will flash 3-4 times. This will be your only indicator that the monitor has been turned on.   Do not shower for the first 24 hours. You may shower after the first 24 hours.   Press the button if you feel a symptom. You will hear a small click. Record Date, Time and Symptom in the Patient Logbook.   When you are ready to remove the patch, follow instructions on the last 2 pages of Patient Logbook. Stick patch monitor onto the last page of Patient Logbook.   Place Patient Logbook in the blue and white box. Use locking tab on box and tape box closed securely. The blue and white box has prepaid postage on it. Please place it in the mailbox as soon as possible. Your physician should have your test results approximately 7 days after the monitor has been mailed back to [facility].   Call [facility] Customer Care at [phone]  if you have questions regarding your ZIO XT patch monitor. Call them immediately if you see an orange light blinking on your monitor.   If your monitor falls off in less than 4 days, contact our Monitor department at [phone].   If your monitor becomes loose or falls off after 4 days call [employer] at [phone] for suggestions on securing your monitor    Follow-Up: At [facility], you and your health needs are our priority.  As part of  our continuing mission to provide you with exceptional heart care, we have created designated Provider Care Teams.  These Care Teams include your primary Cardiologist (physician) and Advanced Practice Providers (APPs -  Physician Assistants and Nurse Practitioners) who all work together to provide you with the care you need, when you need it.  We recommend signing up for the patient portal called "MyChart".  Sign up information is provided on this After Visit Summary.  MyChart is used to connect with patients for Virtual Visits (Telemedicine).  Patients are able to view lab/test results, encounter notes, upcoming appointments, etc.  Non-urgent messages can be sent to your provider as well.   To learn more about what you can do with MyChart, go to [email or web address].    Your next appointment:   2 month(s)  Provider:   You may see Lorine Bears, MD or one of the following Advanced Practice Providers on your designated Care Team:   Eula Listen, [place]

## 2023-02-06 NOTE — Procedures (Signed)
[facility]  Portable Polysomnogram Report Part 1 Phone: [phone] Fax: [phone]  Patient Name: [name], [name] Recording Device: Tawana Scale  D.O.B.: 14-Aug-1960 Acquisition Number: [id number]  Referring Physician: Sallyanne Kuster, FNP-C Acquisition Date: 01/27/2023   History: The patient is a 62 year old female who was referred for evaluation of possible sleep apnea.  Medical History: anxiety, bronchitis COPD, coronary artery disease, depression, GERD, HPV, hypertension, MI, stroke, excessive daytime sleepiness.  Medications: albuterol, aspirin 81, atorvastatin, azelastine, Pulmicort, bupropion, Plavix, diazepam, fluticasone, Advair, furosemide, levalbuterol, losartan, metronidazole, nystatin, ondansetron, pantoprazole, tramadol, nitroglycerin.  PROCEDURE  The unattended portable polysomnogram was conducted on the night of 01/27/2023.  The following parameters were monitored: Nasal and oral airflow, and body position. Additionally, thoracic and abdominal movements were recorded by inductance plethysmography. Oxygen saturation (SpO2) and heart rate (ECG) was monitored using a pulse oximeter.  The tracing was scored using 30 second epochs. Hypopneas were scored per AASM definition VIIID1.B (4% desaturation).   Description: The total recording time was 440.7 minutes. Sleep parameters are not recorded.  Respiratory monitoring demonstrated  snoring across the night in all positions. There were a total of 155 apneas and hypopneas for a Respiratory Event Index of 21.2 apneas and hypopneas per hour of recording. The average duration of the respiratory events was 16.9 seconds with a maximum duration of 102.5 seconds. The respiratory events were associated with peripheral oxygen desaturations on the average to 93 %. The lowest oxygen desaturation associated with a respiratory event was 83 %. Additionally, the mean oxygen saturation was 93 %. The total duration of oxygen < 90%  was 9.4 minutes and <80% was 0.0 minutes.   Cardiac monitoring- The average heart rate during the recording was 78.8 bpm.  Impression: This routine overnight portable polysomnogram  the presence of obstructive sleep apnea. Overall the Respiratory Event Index was 21.2 apneas and hypopneas per hour of with the lowest desaturation to 83 %. The clustering of the respiratory events suggest possible REM-related apnea.  Recommendations:     A CPAP titration would be recommended due to the severity of the sleep apnea. Would recommend weight loss in a patient with a BMI of 29.2 lb/in2.  Yevonne Pax, MD [facility] Diplomate ABMS Pulmonary Critical Care and Sleep Medicine Electronically reviewed and digitally signed   [facility]  Portable Polysomnogram Report Part 2 Phone: [phone] Fax: [phone]   Study Date: 01/27/2023  Patient Name: [name], [name] Recording Device: Tawana Scale  Sex: F Height: 63.0 in.  D.O.B.: 14-Jul-1960 Weight: 165.0 lbs.  Age: 62 years B.M.I: 29.2 lb/in2   Times and Durations  Lights off clock time:  9:21:50 PM Total Recording Time (TRT): 440.7 minutes  Lights on clock time: 4:42:32 AM Time In Bed (TIB): 440.7 minutes   Summary  AHI 21.2 OAI 15.3 CAI 0.1 Lowest Desat 83  AHI is the number of apneas and hypopneas per hour. OAI is the number of obstructive apneas per hour. CAI is the number of central apneas per hour. Lowest Desat is the lowest blood oxygen level that lasted at least 2 seconds.  RESPIRATORY EVENTS   Index (#/hour) Total # of Events Mean duration  (sec) Max duration  (sec) # of Events by Position       Supine Prone Left Right Up  Central Apneas 0.1 1 14.5 14.5    1 0 0 0  Obstructive Apneas 15.3 112 12.4 54.5    94 17 1 0  Mixed Apneas 0.3  2 13.3 15.5    2 0 0 0  Hypopneas 5.5 40 29.6 102.5    18 21 1  0  Apneas + Hypopneas 21.2 155 16.9 102.5    115 38 2 0  Total 21.2 155 16.9 102.5    115 38 2 0  Time in Position    296.1 99.6 42.2  2.8  AHI in Position    23.3 22.9 2.8 0.0     Oximetry Summary   Dur. (min) % TIB  <90 % 9.4 2.1  <85 % 0.1 0.0  <80 % 0.0 0.0  <70 % 0.0 0.0  Total Dur (min) < 89 4.0 min  Average (%) 93  Total # of Desats 199  Desat Index (#/hour) 27.5  Desat Max (%) 11  Desat Max dur (sec) 64.0  Lowest SpO2 % during sleep 83  Duration of Min SpO2 (sec) 3    Heart Rate Stats  Mean HR during sleep (BPM)  Highest HR during sleep 112  (BPM)  Highest HR during TIB  112 (BPM)    Snoring Summary  Total Snoring Episodes 139  Total Duration with Snoring 23.9 minutes  Mean Duration of Snoring 10.3 seconds  Percentage of Snoring 5.4 %

## 2023-02-08 DIAGNOSIS — R002 Palpitations: Secondary | ICD-10-CM | POA: Diagnosis not present

## 2023-02-09 ENCOUNTER — Other Ambulatory Visit: Payer: Self-pay | Admitting: Nurse Practitioner

## 2023-02-09 ENCOUNTER — Other Ambulatory Visit: Payer: Self-pay | Admitting: *Deleted

## 2023-02-09 ENCOUNTER — Encounter: Payer: Self-pay | Admitting: Nurse Practitioner

## 2023-02-09 DIAGNOSIS — N63 Unspecified lump in unspecified breast: Secondary | ICD-10-CM

## 2023-02-09 MED ORDER — PANTOPRAZOLE SODIUM 40 MG PO TBEC: DELAYED_RELEASE_TABLET | ORAL | 3 refills | Status: DC

## 2023-02-12 ENCOUNTER — Telehealth: Payer: Self-pay | Admitting: Nurse Practitioner

## 2023-02-12 ENCOUNTER — Encounter: Payer: Self-pay | Admitting: Nurse Practitioner

## 2023-02-12 ENCOUNTER — Ambulatory Visit: Payer: 59 | Admitting: Nurse Practitioner

## 2023-02-12 VITALS — BP 130/88 | HR 93 | Temp 98.1°F | Resp 16 | Ht 63.0 in | Wt 170.8 lb

## 2023-02-12 DIAGNOSIS — G4733 Obstructive sleep apnea (adult) (pediatric): Secondary | ICD-10-CM

## 2023-02-12 DIAGNOSIS — R0609 Other forms of dyspnea: Secondary | ICD-10-CM | POA: Diagnosis not present

## 2023-02-12 DIAGNOSIS — J432 Centrilobular emphysema: Secondary | ICD-10-CM

## 2023-02-12 DIAGNOSIS — G9339 Other post infection and related fatigue syndromes: Secondary | ICD-10-CM | POA: Diagnosis not present

## 2023-02-12 DIAGNOSIS — U099 Post covid-19 condition, unspecified: Secondary | ICD-10-CM
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
## 2023-02-12 NOTE — Telephone Encounter (Signed)
Awaiting 02/12/23 office notes for Cpap titration order-[name]

## 2023-02-16 ENCOUNTER — Other Ambulatory Visit: Payer: Self-pay | Admitting: Nurse Practitioner

## 2023-02-17 ENCOUNTER — Encounter: Payer: Self-pay | Admitting: Nurse Practitioner

## 2023-02-17 NOTE — Telephone Encounter (Signed)
Please see this

## 2023-02-18 ENCOUNTER — Telehealth: Payer: Self-pay | Admitting: Nurse Practitioner

## 2023-02-18 ENCOUNTER — Encounter: Payer: Self-pay | Admitting: Nurse Practitioner

## 2023-02-18 NOTE — Telephone Encounter (Signed)
SS order faxed to [facility] per patient request; [phone]

## 2023-02-18 NOTE — Telephone Encounter (Signed)
Refaxed dated order and last SS to [facility]; [phone]. Notified patient order was faxed. Gave her tele # [phone]

## 2023-02-19 ENCOUNTER — Encounter: Payer: Self-pay | Admitting: Nurse Practitioner

## 2023-02-27 ENCOUNTER — Other Ambulatory Visit: Payer: Self-pay | Admitting: Nurse Practitioner

## 2023-02-27 ENCOUNTER — Other Ambulatory Visit: Payer: 59

## 2023-02-27 DIAGNOSIS — R002 Palpitations: Secondary | ICD-10-CM | POA: Diagnosis not present

## 2023-03-04 ENCOUNTER — Inpatient Hospital Stay: Admission: RE | Admit: 2023-03-04 | Payer: 59 | Source: Ambulatory Visit

## 2023-03-05 ENCOUNTER — Telehealth: Payer: Self-pay | Admitting: Nurse Practitioner

## 2023-03-05 NOTE — Telephone Encounter (Signed)
SS appointment 03/24/2023 @ [facility]

## 2023-03-12 ENCOUNTER — Other Ambulatory Visit: Payer: 59

## 2023-03-12 ENCOUNTER — Telehealth: Payer: Self-pay | Admitting: Nurse Practitioner

## 2023-03-13 ENCOUNTER — Inpatient Hospital Stay: Admission: RE | Admit: 2023-03-13 | Payer: 59 | Source: Ambulatory Visit

## 2023-03-13 NOTE — Telephone Encounter (Signed)
error 

## 2023-03-16 ENCOUNTER — Telehealth: Payer: Self-pay | Admitting: *Deleted

## 2023-03-16 MED ORDER — BISOPROLOL FUMARATE 5 MG PO TABS: 5.0000 mg | ORAL_TABLET | Freq: Every day | ORAL | 3 refills | Status: DC

## 2023-03-16 NOTE — Telephone Encounter (Signed)
Reviewed results and recommendations with patient and she verbalized understanding with no further questions at this time.

## 2023-03-16 NOTE — Telephone Encounter (Signed)
-----   Message from Nicolasa Ducking sent at 03/16/2023  4:25 PM EST ----- Predominantly normal rhythm with 6 brief runs of SVT (fast heartbeats from the top chambers), with a maximal heart rate of 190 bpm.  These episodes sometimes resulted in triggered events.  Considering her long history, I recommend starting bisoprolol 5 mg daily to help with palpitations and tachycardia.

## 2023-03-17 ENCOUNTER — Other Ambulatory Visit: Payer: Self-pay | Admitting: Nurse Practitioner

## 2023-03-26 ENCOUNTER — Other Ambulatory Visit: Payer: Self-pay | Admitting: Nurse Practitioner

## 2023-03-27 ENCOUNTER — Ambulatory Visit: Payer: 59 | Admitting: Nurse Practitioner

## 2023-04-02 ENCOUNTER — Ambulatory Visit (INDEPENDENT_AMBULATORY_CARE_PROVIDER_SITE_OTHER): Payer: 59 | Admitting: Nurse Practitioner

## 2023-04-02 ENCOUNTER — Ambulatory Visit
Admission: RE | Admit: 2023-04-02 | Discharge: 2023-04-02 | Disposition: A | Discharge status: Home / Self Care | Payer: 59 | Source: Ambulatory Visit | Attending: Nurse Practitioner | Admitting: Nurse Practitioner

## 2023-04-02 ENCOUNTER — Encounter: Payer: Self-pay | Admitting: Nurse Practitioner

## 2023-04-02 VITALS — BP 120/82 | HR 93 | Temp 97.3°F | Ht 63.0 in | Wt 170.6 lb

## 2023-04-02 DIAGNOSIS — J42 Unspecified chronic bronchitis: Secondary | ICD-10-CM | POA: Diagnosis not present

## 2023-04-02 DIAGNOSIS — F419 Anxiety disorder, unspecified: Secondary | ICD-10-CM

## 2023-04-02 DIAGNOSIS — N6019 Diffuse cystic mastopathy of unspecified breast: Secondary | ICD-10-CM | POA: Diagnosis not present

## 2023-04-02 DIAGNOSIS — I10 Essential (primary) hypertension: Secondary | ICD-10-CM

## 2023-04-02 DIAGNOSIS — R0602 Shortness of breath: Secondary | ICD-10-CM | POA: Diagnosis not present

## 2023-04-02 DIAGNOSIS — E785 Hyperlipidemia, unspecified: Secondary | ICD-10-CM

## 2023-04-02 DIAGNOSIS — J432 Centrilobular emphysema: Secondary | ICD-10-CM

## 2023-04-02 DIAGNOSIS — R920 Mammographic microcalcification found on diagnostic imaging of breast: Secondary | ICD-10-CM | POA: Diagnosis not present

## 2023-04-02 DIAGNOSIS — N63 Unspecified lump in unspecified breast: Secondary | ICD-10-CM | POA: Diagnosis not present

## 2023-04-02 DIAGNOSIS — J439 Emphysema, unspecified: Secondary | ICD-10-CM | POA: Diagnosis not present

## 2023-04-02 DIAGNOSIS — G9339 Other post infection and related fatigue syndromes: Secondary | ICD-10-CM

## 2023-04-02 DIAGNOSIS — F32A Depression, unspecified: Secondary | ICD-10-CM

## 2023-04-02 DIAGNOSIS — R92323 Mammographic fibroglandular density, bilateral breasts: Secondary | ICD-10-CM | POA: Diagnosis not present

## 2023-04-02 DIAGNOSIS — N289 Disorder of kidney and ureter, unspecified: Secondary | ICD-10-CM

## 2023-04-02 DIAGNOSIS — I700 Atherosclerosis of aorta: Secondary | ICD-10-CM | POA: Diagnosis not present

## 2023-04-02 DIAGNOSIS — R0609 Other forms of dyspnea: Secondary | ICD-10-CM
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
## 2023-04-03 ENCOUNTER — Telehealth: Payer: Self-pay | Admitting: Nurse Practitioner

## 2023-04-03 NOTE — Telephone Encounter (Signed)
Lvm to move 04/07/23 appointment. CT results not ready-Toni

## 2023-04-03 NOTE — Telephone Encounter (Signed)
S/w to r/s appointment. She stated that her insurance denied cpap titration recommended by [facility] after initial sleep study. I spoke with [facility]. They stated insurance denied due to not medically necessary. They are faxing over denial from insurance. I will consult with [name] once received-Toni
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
## 2023-04-07 ENCOUNTER — Ambulatory Visit: Payer: 59 | Admitting: Physician Assistant

## 2023-04-07 ENCOUNTER — Ambulatory Visit: Payer: 59 | Admitting: Nurse Practitioner

## 2023-04-09 ENCOUNTER — Other Ambulatory Visit: Payer: Self-pay | Admitting: Nurse Practitioner

## 2023-04-15 DIAGNOSIS — N289 Disorder of kidney and ureter, unspecified: Secondary | ICD-10-CM | POA: Insufficient documentation

## 2023-04-15 NOTE — Assessment & Plan Note (Signed)
Lab Results  Component Value Date   CHOL 179 12/26/2022   HDL 69.60 12/26/2022   LDLCALC 81 12/26/2022   TRIG 143.0 12/26/2022   CHOLHDL 3 12/26/2022  Continue statin therapy for cardiovascular risk reduction.

## 2023-04-15 NOTE — Assessment & Plan Note (Signed)
Patient BP  Vitals:   04/02/23 1302  BP: 120/82    in the office. Advised pt to follow a low sodium and heart healthy diet. Continue losartan and amlodipine daily.

## 2023-04-15 NOTE — Assessment & Plan Note (Signed)
Lab Results  Component Value Date   NA 141 12/26/2022   CL 105 12/26/2022   K 4.1 12/26/2022   CO2 26 12/26/2022   BUN 16 12/26/2022   CREATININE 1.16 12/26/2022   GFR 50.86 (L) 12/26/2022   CALCIUM 9.6 12/26/2022   ALBUMIN 4.3 12/26/2022   GLUCOSE 115 (H) 12/26/2022  Advised to increase fluid intake and avoid nephrotoxic agent. Will continue to monitor

## 2023-04-15 NOTE — Assessment & Plan Note (Signed)
Chronic stable. Valium 5 mg twice a day as needed.

## 2023-04-15 NOTE — Assessment & Plan Note (Signed)
Chronic unstable,followed by pulmonology

## 2023-04-23 DIAGNOSIS — J301 Allergic rhinitis due to pollen: Secondary | ICD-10-CM | POA: Diagnosis not present

## 2023-04-25 ENCOUNTER — Other Ambulatory Visit: Payer: Self-pay | Admitting: Nurse Practitioner

## 2023-04-27 ENCOUNTER — Other Ambulatory Visit: Payer: Self-pay

## 2023-04-27 DIAGNOSIS — J432 Centrilobular emphysema: Secondary | ICD-10-CM

## 2023-04-27 MED ORDER — FLUTICASONE-SALMETEROL 250-50 MCG/ACT IN AEPB: 1.0000 | INHALATION_SPRAY | Freq: Two times a day (BID) | RESPIRATORY_TRACT | 5 refills | Status: DC

## 2023-04-28 ENCOUNTER — Encounter: Payer: Self-pay | Admitting: Nurse Practitioner

## 2023-04-30 ENCOUNTER — Other Ambulatory Visit (INDEPENDENT_AMBULATORY_CARE_PROVIDER_SITE_OTHER): Payer: 59

## 2023-04-30 DIAGNOSIS — N289 Disorder of kidney and ureter, unspecified: Secondary | ICD-10-CM | POA: Diagnosis not present

## 2023-04-30 DIAGNOSIS — J301 Allergic rhinitis due to pollen: Secondary | ICD-10-CM | POA: Diagnosis not present

## 2023-04-30 NOTE — Addendum Note (Signed)
 Addended by: Jarvis Morgan D on: 04/30/2023 02:32 PM   Modules accepted: Orders

## 2023-05-04 LAB — TIQ-NTM

## 2023-05-04 LAB — COMPREHENSIVE METABOLIC PANEL

## 2023-05-05 ENCOUNTER — Other Ambulatory Visit
Admission: RE | Admit: 2023-05-05 | Discharge: 2023-05-05 | Disposition: A | Discharge status: Home / Self Care | Payer: 59 | Source: Ambulatory Visit | Attending: Medical | Admitting: Medical

## 2023-05-05 ENCOUNTER — Ambulatory Visit: Payer: 59 | Admitting: Nurse Practitioner

## 2023-05-05 ENCOUNTER — Ambulatory Visit: Payer: 59 | Attending: Medical | Admitting: Medical

## 2023-05-05 ENCOUNTER — Encounter: Payer: Self-pay | Admitting: Medical

## 2023-05-05 ENCOUNTER — Encounter: Payer: Self-pay | Admitting: Nurse Practitioner

## 2023-05-05 VITALS — BP 172/100 | HR 79 | Ht 63.0 in | Wt 174.4 lb

## 2023-05-05 VITALS — BP 130/86 | HR 102 | Temp 97.8°F | Resp 16 | Ht 63.0 in | Wt 174.6 lb

## 2023-05-05 DIAGNOSIS — I10 Essential (primary) hypertension: Secondary | ICD-10-CM | POA: Diagnosis not present

## 2023-05-05 DIAGNOSIS — R0609 Other forms of dyspnea: Secondary | ICD-10-CM

## 2023-05-05 DIAGNOSIS — I2510 Atherosclerotic heart disease of native coronary artery without angina pectoris: Secondary | ICD-10-CM

## 2023-05-05 DIAGNOSIS — G9339 Other post infection and related fatigue syndromes: Secondary | ICD-10-CM

## 2023-05-05 DIAGNOSIS — G4733 Obstructive sleep apnea (adult) (pediatric): Secondary | ICD-10-CM

## 2023-05-05 DIAGNOSIS — U099 Post covid-19 condition, unspecified: Secondary | ICD-10-CM

## 2023-05-05 DIAGNOSIS — G4719 Other hypersomnia: Secondary | ICD-10-CM | POA: Diagnosis not present

## 2023-05-05 DIAGNOSIS — J432 Centrilobular emphysema: Secondary | ICD-10-CM

## 2023-05-05 DIAGNOSIS — E782 Mixed hyperlipidemia: Secondary | ICD-10-CM | POA: Diagnosis not present

## 2023-05-05 DIAGNOSIS — R002 Palpitations: Secondary | ICD-10-CM

## 2023-05-05 DIAGNOSIS — I471 Supraventricular tachycardia, unspecified: Secondary | ICD-10-CM

## 2023-05-05 LAB — BASIC METABOLIC PANEL
Anion gap: 14 (ref 5–15)
BUN: 19 mg/dL (ref 8–23)
CO2: 22 mmol/L (ref 22–32)
Calcium: 9.2 mg/dL (ref 8.9–10.3)
Chloride: 102 mmol/L (ref 98–111)
Creatinine, Ser: 1.08 mg/dL — ABNORMAL HIGH (ref 0.44–1.00)
GFR, Estimated: 58 mL/min — ABNORMAL LOW (ref >60)
Glucose, Bld: 105 mg/dL — ABNORMAL HIGH (ref 70–99)
Potassium: 3.7 mmol/L (ref 3.5–5.1)
Sodium: 138 mmol/L (ref 135–145)

## 2023-05-05 LAB — D-DIMER, QUANTITATIVE: D-Dimer, Quant: <0.27 ug{FEU}/mL (ref 0.00–0.50)

## 2023-05-05 MED ORDER — PREDNISONE 10 MG PO TABS: 10.0000 mg | ORAL_TABLET | Freq: Every day | ORAL | 2 refills | Status: DC

## 2023-05-05 MED ORDER — AMLODIPINE BESYLATE 5 MG PO TABS: 5.0000 mg | ORAL_TABLET | Freq: Two times a day (BID) | ORAL | 3 refills | Status: DC

## 2023-05-05 MED ORDER — BISOPROLOL FUMARATE 5 MG PO TABS: 5.0000 mg | ORAL_TABLET | Freq: Every day | ORAL | 3 refills | Status: DC

## 2023-05-05 NOTE — Patient Instructions (Signed)
 Medication Instructions:  Your physician recommends the following medication changes.  CHANGE: Amlodipine  to 5 mg by mouth twice a day  RESTART TAKING: Bisoprolol  5 mg by mouth daily  *If you need a refill on your cardiac medications before your next appointment, please call your pharmacy*   Lab Work: Your provider would like for you to have following labs drawn today (CBC, BMP, D-Dimer).     Testing/Procedures: Your physician has requested that you have a cardiac catheterization. Cardiac catheterization is used to diagnose and/or treat various heart conditions. Doctors may recommend this procedure for a number of different reasons. The most common reason is to evaluate chest pain. Chest pain can be a symptom of coronary artery disease (CAD), and cardiac catheterization can show whether plaque is narrowing or blocking your heart's arteries. This procedure is also used to evaluate the valves, as well as measure the blood flow and oxygen levels in different parts of your heart. For further information please visit [email or web address] Please follow instruction sheet, as given. Please see instructions below   Follow-Up: At [facility], you and your health needs are our priority.  As part of our continuing mission to provide you with exceptional heart care, we have created designated Provider Care Teams.  These Care Teams include your primary Cardiologist (physician) and Advanced Practice Providers (APPs -  Physician Assistants and Nurse Practitioners) who all work together to provide you with the care you need, when you need it.  We recommend signing up for the patient portal called MyChart.  Sign up information is provided on this After Visit Summary.  MyChart is used to connect with patients for Virtual Visits (Telemedicine).  Patients are able to view lab/test results, encounter notes, upcoming appointments, etc.  Non-urgent messages can be sent to your provider as well.   To  learn more about what you can do with MyChart, go to [email or web address].    Your next appointment:   3 week(s)  Provider:   You may see Deatrice Cage, MD or one of the following Advanced Practice Providers on your designated Care Team:   Lonni Meager, NP Bernardino Bring, PA-C Cadence Franchester, PA-C Tylene Lunch, NP Barnie Hila, NP     [facility] A DEPT OF [place] AT [facility] [address] [place] [address] Dept: [phone] Loc: [phone]  Fenix Ruppe Cody  05/05/2023  You are scheduled for a Cardiac Catheterization on Friday, January 24 with Dr. Deatrice Cage.  1. Please arrive at the [facility] Entrance of [facility], [address], [place] [address] at 6:30 AM (This is 1 hour(s) prior to your procedure time).  Proceed to the Check-In Desk directly inside the entrance.  Procedure Parking: Use the entrance off of the [address] side of the hospital. Turn right upon entering and follow the driveway to parking that is directly in front of the [facility] Center. There is no valet parking available at this entrance, however there is an awning directly in front of the [facility] Vascular Center for drop off/ pick up for patients.  Special note: Every effort is made to have your procedure done on time. Please understand that emergencies sometimes delay scheduled procedures.  2. Diet: Do not eat solid foods after midnight.  The patient may have clear liquids until 5am upon the day of the procedure.  3. Labs: You will need to have blood drawn today (CBC, BMP)  4. Medication instructions in preparation for your procedure:   Contrast Allergy: No  Hold Lasix  morning of procedure  On the morning of your procedure, take your Aspirin  81 mg and any morning medicines NOT listed above.  You may use sips of water .  5. Plan to go home the same day, you will only stay  overnight if medically necessary. 6. Bring a current list of your medications and current insurance cards. 7. You MUST have a responsible person to drive you home. 8. Someone MUST be with you the first 24 hours after you arrive home or your discharge will be delayed. 9. Please wear clothes that are easy to get on and off and wear slip-on shoes.  Thank you for allowing us  to care for you!   -- [facility]
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
## 2023-05-07 ENCOUNTER — Telehealth: Payer: Self-pay | Admitting: Nurse Practitioner

## 2023-05-07 NOTE — Telephone Encounter (Signed)
Awaiting 05/05/23 office notes for Cpap Titration order-Toni

## 2023-05-14 ENCOUNTER — Other Ambulatory Visit: Payer: Self-pay

## 2023-05-14 DIAGNOSIS — I2510 Atherosclerotic heart disease of native coronary artery without angina pectoris: Secondary | ICD-10-CM

## 2023-05-15 ENCOUNTER — Other Ambulatory Visit: Payer: Self-pay

## 2023-05-15 ENCOUNTER — Ambulatory Visit: Payer: 59

## 2023-05-15 ENCOUNTER — Emergency Department
Admission: EM | Admit: 2023-05-15 | Discharge: 2023-05-15 | Disposition: A | Discharge status: Home / Self Care | Payer: 59 | Source: Home / Self Care | Attending: Emergency Medicine | Admitting: Emergency Medicine

## 2023-05-15 ENCOUNTER — Encounter: Payer: Self-pay | Admitting: Cardiovascular Disease

## 2023-05-15 ENCOUNTER — Ambulatory Visit
Admission: RE | Admit: 2023-05-15 | Discharge: 2023-05-15 | Disposition: A | Discharge status: Other Acute Inpatient Hospital | Payer: 59 | Attending: Cardiovascular Disease | Admitting: Cardiovascular Disease

## 2023-05-15 ENCOUNTER — Encounter
Admission: RE | Disposition: A | Discharge status: Other Acute Inpatient Hospital | Payer: Self-pay | Source: Home / Self Care | Attending: Cardiovascular Disease

## 2023-05-15 DIAGNOSIS — I25118 Atherosclerotic heart disease of native coronary artery with other forms of angina pectoris: Secondary | ICD-10-CM | POA: Diagnosis not present

## 2023-05-15 DIAGNOSIS — Z8673 Personal history of transient ischemic attack (TIA), and cerebral infarction without residual deficits: Secondary | ICD-10-CM | POA: Diagnosis not present

## 2023-05-15 DIAGNOSIS — I471 Supraventricular tachycardia, unspecified: Secondary | ICD-10-CM | POA: Diagnosis not present

## 2023-05-15 DIAGNOSIS — Z87891 Personal history of nicotine dependence: Secondary | ICD-10-CM | POA: Diagnosis not present

## 2023-05-15 DIAGNOSIS — I700 Atherosclerosis of aorta: Secondary | ICD-10-CM | POA: Diagnosis not present

## 2023-05-15 DIAGNOSIS — G4733 Obstructive sleep apnea (adult) (pediatric): Secondary | ICD-10-CM | POA: Insufficient documentation

## 2023-05-15 DIAGNOSIS — I2584 Coronary atherosclerosis due to calcified coronary lesion: Secondary | ICD-10-CM | POA: Insufficient documentation

## 2023-05-15 DIAGNOSIS — Z955 Presence of coronary angioplasty implant and graft: Secondary | ICD-10-CM | POA: Diagnosis not present

## 2023-05-15 DIAGNOSIS — I739 Peripheral vascular disease, unspecified: Secondary | ICD-10-CM | POA: Insufficient documentation

## 2023-05-15 DIAGNOSIS — I10 Essential (primary) hypertension: Secondary | ICD-10-CM | POA: Insufficient documentation

## 2023-05-15 DIAGNOSIS — F419 Anxiety disorder, unspecified: Secondary | ICD-10-CM | POA: Insufficient documentation

## 2023-05-15 DIAGNOSIS — Z7982 Long term (current) use of aspirin: Secondary | ICD-10-CM | POA: Insufficient documentation

## 2023-05-15 DIAGNOSIS — R531 Weakness: Secondary | ICD-10-CM | POA: Insufficient documentation

## 2023-05-15 DIAGNOSIS — F444 Conversion disorder with motor symptom or deficit: Secondary | ICD-10-CM

## 2023-05-15 DIAGNOSIS — R2990 Unspecified symptoms and signs involving the nervous system: Secondary | ICD-10-CM

## 2023-05-15 DIAGNOSIS — R002 Palpitations: Secondary | ICD-10-CM | POA: Insufficient documentation

## 2023-05-15 DIAGNOSIS — Z79899 Other long term (current) drug therapy: Secondary | ICD-10-CM | POA: Diagnosis not present

## 2023-05-15 DIAGNOSIS — I5032 Chronic diastolic (congestive) heart failure: Secondary | ICD-10-CM | POA: Insufficient documentation

## 2023-05-15 DIAGNOSIS — R0609 Other forms of dyspnea: Secondary | ICD-10-CM | POA: Diagnosis not present

## 2023-05-15 DIAGNOSIS — E782 Mixed hyperlipidemia: Secondary | ICD-10-CM | POA: Diagnosis not present

## 2023-05-15 DIAGNOSIS — R4182 Altered mental status, unspecified: Secondary | ICD-10-CM | POA: Diagnosis not present

## 2023-05-15 DIAGNOSIS — I110 Hypertensive heart disease with heart failure: Secondary | ICD-10-CM | POA: Insufficient documentation

## 2023-05-15 DIAGNOSIS — J449 Chronic obstructive pulmonary disease, unspecified: Secondary | ICD-10-CM | POA: Insufficient documentation

## 2023-05-15 DIAGNOSIS — Z7902 Long term (current) use of antithrombotics/antiplatelets: Secondary | ICD-10-CM | POA: Insufficient documentation

## 2023-05-15 DIAGNOSIS — I2510 Atherosclerotic heart disease of native coronary artery without angina pectoris: Secondary | ICD-10-CM | POA: Diagnosis present

## 2023-05-15 DIAGNOSIS — R29818 Other symptoms and signs involving the nervous system: Secondary | ICD-10-CM | POA: Diagnosis not present

## 2023-05-15 HISTORY — PX: RIGHT/LEFT HEART CATH AND CORONARY ANGIOGRAPHY: CATH118266

## 2023-05-15 LAB — COMPREHENSIVE METABOLIC PANEL
ALT: 18 U/L (ref 0–44)
AST: 19 U/L (ref 15–41)
Albumin: 4.3 g/dL (ref 3.5–5.0)
Alkaline Phosphatase: 64 U/L (ref 38–126)
Anion gap: 14 (ref 5–15)
BUN: 17 mg/dL (ref 8–23)
CO2: 19 mmol/L — ABNORMAL LOW (ref 22–32)
Calcium: 9 mg/dL (ref 8.9–10.3)
Chloride: 109 mmol/L (ref 98–111)
Creatinine, Ser: 1.02 mg/dL — ABNORMAL HIGH (ref 0.44–1.00)
GFR, Estimated: >60 mL/min (ref >60)
Glucose, Bld: 134 mg/dL — ABNORMAL HIGH (ref 70–99)
Potassium: 3.3 mmol/L — ABNORMAL LOW (ref 3.5–5.1)
Sodium: 142 mmol/L (ref 135–145)
Total Bilirubin: 0.7 mg/dL (ref 0.0–1.2)
Total Protein: 7.1 g/dL (ref 6.5–8.1)

## 2023-05-15 LAB — CBC WITH DIFFERENTIAL/PLATELET
Abs Immature Granulocytes: 0.06 10*3/uL (ref 0.00–0.07)
Basophils Absolute: 0.1 10*3/uL (ref 0.0–0.1)
Basophils Relative: 1 %
Eosinophils Absolute: 0.3 10*3/uL (ref 0.0–0.5)
Eosinophils Relative: 3 %
HCT: 43.3 % (ref 36.0–46.0)
Hemoglobin: 14.5 g/dL (ref 12.0–15.0)
Immature Granulocytes: 1 %
Lymphocytes Relative: 29 %
Lymphs Abs: 3 10*3/uL (ref 0.7–4.0)
MCH: 31.3 pg (ref 26.0–34.0)
MCHC: 33.5 g/dL (ref 30.0–36.0)
MCV: 93.3 fL (ref 80.0–100.0)
Monocytes Absolute: 0.5 10*3/uL (ref 0.1–1.0)
Monocytes Relative: 5 %
Neutro Abs: 6.4 10*3/uL (ref 1.7–7.7)
Neutrophils Relative %: 61 %
Platelets: 280 10*3/uL (ref 150–400)
RBC: 4.64 MIL/uL (ref 3.87–5.11)
RDW: 13 % (ref 11.5–15.5)
WBC: 10.3 10*3/uL (ref 4.0–10.5)
nRBC: 0 % (ref 0.0–0.2)

## 2023-05-15 LAB — POCT I-STAT EG7
Acid-base deficit: 1 mmol/L (ref 0.0–2.0)
Bicarbonate: 21.1 mmol/L (ref 20.0–28.0)
Calcium, Ion: 1.15 mmol/L (ref 1.15–1.40)
HCT: 37 % (ref 36.0–46.0)
Hemoglobin: 12.6 g/dL (ref 12.0–15.0)
O2 Saturation: 71 %
Potassium: 3.1 mmol/L — ABNORMAL LOW (ref 3.5–5.1)
Sodium: 144 mmol/L (ref 135–145)
TCO2: 22 mmol/L (ref 22–32)
pCO2, Ven: 27.8 mm[Hg] — ABNORMAL LOW (ref 44–60)
pH, Ven: 7.488 — ABNORMAL HIGH (ref 7.25–7.43)
pO2, Ven: 33 mm[Hg] (ref 32–45)

## 2023-05-15 LAB — PROTIME-INR
INR: 1 (ref 0.8–1.2)
Prothrombin Time: 13.7 s (ref 11.4–15.2)

## 2023-05-15 LAB — POCT I-STAT 7, (LYTES, BLD GAS, ICA,H+H)
Acid-base deficit: 2 mmol/L (ref 0.0–2.0)
Bicarbonate: 18.9 mmol/L — ABNORMAL LOW (ref 20.0–28.0)
Calcium, Ion: 1.13 mmol/L — ABNORMAL LOW (ref 1.15–1.40)
HCT: 37 % (ref 36.0–46.0)
Hemoglobin: 12.6 g/dL (ref 12.0–15.0)
O2 Saturation: 99 %
Potassium: 3.1 mmol/L — ABNORMAL LOW (ref 3.5–5.1)
Sodium: 143 mmol/L (ref 135–145)
TCO2: 20 mmol/L — ABNORMAL LOW (ref 22–32)
pCO2 arterial: 22.3 mm[Hg] — ABNORMAL LOW (ref 32–48)
pH, Arterial: 7.536 — ABNORMAL HIGH (ref 7.35–7.45)
pO2, Arterial: 111 mm[Hg] — ABNORMAL HIGH (ref 83–108)

## 2023-05-15 LAB — APTT: aPTT: 55 s — ABNORMAL HIGH (ref 24–36)

## 2023-05-15 LAB — GLUCOSE, CAPILLARY: Glucose-Capillary: 118 mg/dL — ABNORMAL HIGH (ref 70–99)

## 2023-05-15 SURGERY — RIGHT/LEFT HEART CATH AND CORONARY ANGIOGRAPHY
Anesthesia: Moderate Sedation | Laterality: Bilateral

## 2023-05-15 MED ORDER — FENTANYL CITRATE (PF) 100 MCG/2ML IJ SOLN
INTRAMUSCULAR | Status: DC | PRN
  Administered 2023-05-15 (×2): 25 ug via INTRAVENOUS

## 2023-05-15 MED ORDER — SODIUM CHLORIDE 0.9 % WEIGHT BASED INFUSION: 1.0000 mL/kg/h | INTRAVENOUS | Status: DC

## 2023-05-15 MED ORDER — ACETAMINOPHEN 325 MG PO TABS: 650.0000 mg | ORAL_TABLET | ORAL | Status: DC | PRN

## 2023-05-15 MED ORDER — SODIUM CHLORIDE 0.9 % WEIGHT BASED INFUSION
3.0000 mL/kg/h | INTRAVENOUS | Status: AC
  Administered 2023-05-15: 3 mL/kg/h via INTRAVENOUS

## 2023-05-15 MED ORDER — MIDAZOLAM HCL 2 MG/2ML IJ SOLN
INTRAMUSCULAR | Status: AC
  Filled 2023-05-15: qty 2

## 2023-05-15 MED ORDER — FENTANYL CITRATE (PF) 100 MCG/2ML IJ SOLN
INTRAMUSCULAR | Status: AC
  Filled 2023-05-15: qty 2

## 2023-05-15 MED ORDER — ACETAMINOPHEN 500 MG PO TABS
1000.0000 mg | ORAL_TABLET | Freq: Once | ORAL | Status: AC
  Administered 2023-05-15: 1000 mg via ORAL
  Filled 2023-05-15: qty 2

## 2023-05-15 MED ORDER — HEPARIN SODIUM (PORCINE) 1000 UNIT/ML IJ SOLN
INTRAMUSCULAR | Status: DC | PRN
  Administered 2023-05-15: 4000 [IU] via INTRAVENOUS

## 2023-05-15 MED ORDER — ASPIRIN 81 MG PO CHEW: 81.0000 mg | CHEWABLE_TABLET | ORAL | Status: DC

## 2023-05-15 MED ORDER — MIDAZOLAM HCL 2 MG/2ML IJ SOLN
INTRAMUSCULAR | Status: DC | PRN
  Administered 2023-05-15 (×2): 1 mg via INTRAVENOUS

## 2023-05-15 MED ORDER — IOHEXOL 350 MG/ML SOLN: 75.0000 mL | Freq: Once | INTRAVENOUS | Status: DC | PRN

## 2023-05-15 MED ORDER — VERAPAMIL HCL 2.5 MG/ML IV SOLN
INTRAVENOUS | Status: AC
  Filled 2023-05-15: qty 2

## 2023-05-15 MED ORDER — SODIUM CHLORIDE 0.9% FLUSH: 3.0000 mL | Freq: Two times a day (BID) | INTRAVENOUS | Status: DC

## 2023-05-15 MED ORDER — SODIUM CHLORIDE 0.9 % IV SOLN: 250.0000 mL | INTRAVENOUS | Status: DC | PRN

## 2023-05-15 MED ORDER — SODIUM CHLORIDE 0.9 % IV SOLN: INTRAVENOUS | Status: DC

## 2023-05-15 MED ORDER — HEPARIN (PORCINE) IN NACL 2000-0.9 UNIT/L-% IV SOLN
INTRAVENOUS | Status: DC | PRN
  Administered 2023-05-15: 1000 mL

## 2023-05-15 MED ORDER — SODIUM CHLORIDE 0.9% FLUSH: 3.0000 mL | INTRAVENOUS | Status: DC | PRN

## 2023-05-15 MED ORDER — LIDOCAINE HCL 1 % IJ SOLN
INTRAMUSCULAR | Status: AC
  Filled 2023-05-15: qty 20

## 2023-05-15 MED ORDER — ONDANSETRON HCL 4 MG/2ML IJ SOLN: 4.0000 mg | Freq: Four times a day (QID) | INTRAMUSCULAR | Status: DC | PRN

## 2023-05-15 MED ORDER — IOHEXOL 300 MG/ML  SOLN
INTRAMUSCULAR | Status: DC | PRN
  Administered 2023-05-15: 26 mL

## 2023-05-15 MED ORDER — HEPARIN SODIUM (PORCINE) 1000 UNIT/ML IJ SOLN
INTRAMUSCULAR | Status: AC
Start: 2023-05-15 — End: ?
  Filled 2023-05-15: qty 10

## 2023-05-15 MED ORDER — VERAPAMIL HCL 2.5 MG/ML IV SOLN
INTRAVENOUS | Status: DC | PRN
  Administered 2023-05-15: 2.5 mg via INTRA_ARTERIAL

## 2023-05-15 MED ORDER — LIDOCAINE HCL (PF) 1 % IJ SOLN
INTRAMUSCULAR | Status: DC | PRN
  Administered 2023-05-15: 5 mL

## 2023-05-15 MED ORDER — HEPARIN (PORCINE) IN NACL 1000-0.9 UT/500ML-% IV SOLN
INTRAVENOUS | Status: AC
  Filled 2023-05-15: qty 1000

## 2023-05-15 MED ORDER — LORAZEPAM 2 MG/ML IJ SOLN
1.0000 mg | Freq: Once | INTRAMUSCULAR | Status: AC
  Administered 2023-05-15: 1 mg via INTRAVENOUS

## 2023-05-15 MED ORDER — LORAZEPAM 2 MG/ML IJ SOLN
1.0000 mg | Freq: Once | INTRAMUSCULAR | Status: AC
  Administered 2023-05-15: 1 mg via INTRAVENOUS
  Filled 2023-05-15: qty 1

## 2023-05-15 SURGICAL SUPPLY — 15 items
CATH BALLN WEDGE 5F 110CM (CATHETERS) IMPLANT
CATH INFINITI 5 FR JL3.5 (CATHETERS) IMPLANT
CATH INFINITI AMBI 5FR JK (CATHETERS) IMPLANT
DEVICE RAD TR BAND REGULAR (VASCULAR PRODUCTS) IMPLANT
DRAPE BRACHIAL (DRAPES) IMPLANT
GLIDESHEATH SLEND SS 6F .021 (SHEATH) IMPLANT
GUIDEWIRE INQWIRE 1.5J.035X260 (WIRE) IMPLANT
INQWIRE 1.5J .035X260CM (WIRE) ×1
KIT SYRINGE INJ CVI SPIKEX1 (MISCELLANEOUS) IMPLANT
PACK CARDIAC CATH (CUSTOM PROCEDURE TRAY) ×1 IMPLANT
PROTECTION STATION PRESSURIZED (MISCELLANEOUS) ×1
SET ATX-X65L (MISCELLANEOUS) IMPLANT
SHEATH GLIDE SLENDER 4/5FR (SHEATH) IMPLANT
STATION PROTECTION PRESSURIZED (MISCELLANEOUS) IMPLANT
WIRE HITORQ VERSACORE ST 145CM (WIRE) IMPLANT

## 2023-05-15 NOTE — Progress Notes (Addendum)
 CODE STROKE- PHARMACY COMMUNICATION  Time CODE STROKE called/page received:1002 AM 1/24  Time response to CODE STROKE was made (in person or via phone):   Time Stroke Kit retrieved from [facility] (only if needed): TNK was not required Pulled Ativan from the [facility] per MD   Name of Provider contacted: Milon Dikes  Prior to Admission medications   Medication Sig Start Date End Date Taking? Authorizing Provider  acetaminophen (TYLENOL) 500 MG tablet Take 1,000 mg by mouth every 6 (six) hours as needed for moderate pain (pain score 4-6).    [provider]  albuterol (VENTOLIN HFA) 108 (90 Base) MCG/ACT inhaler INHALE 2 PUFFS BY MOUTH EVERY 6 HOURS AS NEEDED FOR WHEEZING OR SHORTNESS OF BREATH 04/09/23   Sallyanne Kuster, NP  amLODipine (NORVASC) 5 MG tablet Take 1 tablet (5 mg total) by mouth 2 (two) times daily. 05/05/23   Furth, Cadence H, PA-C  aspirin EC 81 MG EC tablet Take 1 tablet (81 mg total) by mouth daily. 04/14/16   Adrian Saran, MD  atorvastatin (LIPITOR) 80 MG tablet Take 1 tablet (80 mg total) by mouth daily. 02/03/23   Dunn, Raymon Mutton, PA-C  bisoprolol (ZEBETA) 5 MG tablet Take 1 tablet (5 mg total) by mouth daily. Patient not taking: Reported on 05/12/2023 05/05/23   Furth, Cadence H, PA-C  budesonide (PULMICORT) 0.5 MG/2ML nebulizer solution Take 2 mLs (0.5 mg total) by nebulization daily as needed (asthma). 09/25/22   Sallyanne Kuster, NP  buPROPion (WELLBUTRIN XL) 300 MG 24 hr tablet Take 1 tablet (300 mg total) by mouth daily. 12/26/22   Kara Dies, NP  clopidogrel (PLAVIX) 75 MG tablet Take 1 tablet (75 mg total) by mouth daily. 11/20/21   Corky Downs, MD  diazepam (VALIUM) 5 MG tablet Take 1 tablet (5 mg total) by mouth every 12 (twelve) hours as needed for anxiety. Patient taking differently: Take 5 mg by mouth 2 (two) times daily. 04/10/23   Kara Dies, NP  fluticasone (FLONASE) 50 MCG/ACT nasal spray Place 1 spray into both nostrils daily.    [provider]  fluticasone-salmeterol (ADVAIR) 250-50 MCG/ACT AEPB Inhale 1 puff into the lungs in the morning and at bedtime. Patient not taking: Reported on 05/05/2023 04/27/23   Sallyanne Kuster, NP  Fluticasone-Umeclidin-Vilant (TRELEGY ELLIPTA) 200-62.5-25 MCG/ACT AEPB Inhale 1 puff into the lungs daily.    [provider]  furosemide (LASIX) 20 MG tablet Take 0.5 tablets (10 mg total) by mouth daily. 08/15/22   Kara Dies, NP  ipratropium-albuterol (DUONEB) 0.5-2.5 (3) MG/3ML SOLN INHALE 3 MILLILITERS VIA NEBULIZATION BY MOUTH EVERY 6 HOURS AS NEEDED 01/02/23   Yevonne Pax, MD  levalbuterol Pauline Aus) 1.25 MG/0.5ML nebulizer solution Take 1.25 mg by nebulization every 6 (six) hours as needed for wheezing or shortness of breath. 09/25/22   Sallyanne Kuster, NP  loratadine (CLARITIN) 10 MG tablet Take 10 mg by mouth daily as needed for allergies.    [provider]  losartan (COZAAR) 100 MG tablet Take 1 tablet (100 mg total) by mouth daily. 12/26/22   Kara Dies, NP  nitroGLYCERIN (NITROSTAT) 0.4 MG SL tablet Place 0.4 mg under the tongue every 5 (five) minutes x 3 doses as needed for chest pain. 04/06/16   [provider]  ondansetron (ZOFRAN-ODT) 4 MG disintegrating tablet PLACE 1 TABLET BY MOUTH EVERY 8 HOURS AS NEEDED FOR NAUSEA AND/OR VOMITING 03/18/23   Kara Dies, NP  pantoprazole (PROTONIX) 40 MG tablet TAKE 1 TABLET BY MOUTH TWICE DAILY  BEFORE A MEAL 02/09/23   Kara Dies, NP  predniSONE (DELTASONE) 10 MG tablet Take 1 tablet (10 mg total) by mouth daily with breakfast. Patient taking differently: Take 10 mg by mouth daily as needed (shortness of breath). 05/05/23   Sallyanne Kuster, NP  UNABLE TO FIND Med Name: weekly allergy shots    [provider]   Effie Shy, PharmD Pharmacy Resident  05/15/2023 1:34 PM

## 2023-05-15 NOTE — ED Triage Notes (Signed)
Pt arrives from outpatient for a catheterization for AMS. Pt arrives stuttering. Pt ANOx4 on arrival. AMS started at 0953. Catheterization RN stated pt was unable to verbalize anything or follow commands at start of AMS. Hx of stroke in 2018 and is on blood thinners. Given 4K units of heparin, 2.5 of verapamil, 2 mg of versed, and 50 mcg of fentanyl during cath.
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
## 2023-05-15 NOTE — Interval H&P Note (Signed)
History and Physical Interval Note:  05/15/2023 8:02 AM  [name]  has presented today for surgery, with the diagnosis of R and L Cath    Dyspnea on exertion.  The various methods of treatment have been discussed with the patient and family. After consideration of risks, benefits and other options for treatment, the patient has consented to  Procedure(s): RIGHT/LEFT HEART CATH AND CORONARY ANGIOGRAPHY (Bilateral) as a surgical intervention.  The patient's history has been reviewed, patient examined, no change in status, stable for surgery.  I have reviewed the patient's chart and labs.  Questions were answered to the patient's satisfaction.     Lorine Bears

## 2023-05-15 NOTE — Code Documentation (Addendum)
 Stroke Response Nurse Documentation Code Documentation  [name] is a 63 y.o. female admitted to [facility] on 05/14/2022 for elective cardiac cath with past medical hx of anxiety, depression, HTN, stroke like episode, COPD, CAD, OSA, cervical radiculopathy. On No antithrombotic. Code stroke was activated by specials recovery .   Patient on specials recovery unit where she was LKW at 09:50. Elective cardiac cath, procedure completed this morning without complications and suddenly she started appearing confused, making incomprehensible sounds, would not follow commands, and weak in all extremities, per RN report.   Stroke team at the bedside after patient activation. Patient to CT with team. NIHSS 23, see documentation for details and code stroke times. Patient with disoriented, not following commands, right facial droop, bilateral arm weakness, bilateral leg weakness, bilateral decreased sensation, Global aphasia , and dysarthria  on exam. The following imaging was completed:  CT Head and MRI. Difficulty obtaining IV access in CT. Patient is not a candidate for IV Thrombolytic due to not a stroke on MRI, per MD. Patient is not a candidate for IR due to no stroke on MRI, per MD.   Care/Plan: not a stroke, per MD. NIHSS and swallow screen no longer required.   Bedside handoff with RN Marliss Simple.    Gareld June  Stroke Response RN

## 2023-05-15 NOTE — OR Nursing (Signed)
Pt transferred to CT from specials recovery after code stroke activated. Then sent to MRI. ! Mg ativan iv given for anxiety per Neurology order. Pt crying and anxious. Discharged to Er Nurse Geroge Baseman rn. Pt verbalizing Im so scared. What's wrong with me. Able to move upper extremities to command. Tr band removed by Oswaldo Cueto at 11:30 small hematoma noted right radial artery site. No active bleeding. Site dressed with guaze covered Tegaderm. Multiple failed IV sites bruised from CT. Son at bedside, son given patient belongings.

## 2023-05-15 NOTE — ED Provider Notes (Signed)
[facility] Provider Note    None    (approximate)  History   Chief Complaint: Speech difficulty, weakness  HPI  [name] is a 63 y.o. female with a past medical history of anxiety, gastric reflux, hypertension, MI, CVA in 2018 who presents to the emergency department for acute onset of generalized weakness and speech difficulty.  Patient was [facility] today for cardiac catheterization.  Catheterization was going well however shortly after which patient began experiencing generalized weakness and a stuttering quality to her speech.  A code stroke was activated and neurology evaluated.  Patient went for CT scan and MRI and then brought urgently to the emergency department for further evaluation and monitoring.  Physical Exam   Triage Vital Signs: ED Triage Vitals  Encounter Vitals Group     BP      Systolic BP Percentile      Diastolic BP Percentile      Pulse      Resp      Temp      Temp src      SpO2      Weight      Height      Head Circumference      Peak Flow      Pain Score      Pain Loc      Pain Education      Exclude from Growth Chart     Most recent vital signs: There were no vitals filed for this visit.  General: Patient is awake she is quite anxious appearing.  Patient does have a stuttering type speech which the son states is new.  Patient also describes generalized weakness.  But denies focal weakness.  Patient had a right wrist access point for her catheterization. CV:  Good peripheral perfusion.  Regular rate and rhythm  Resp:  Normal effort.  Equal breath sounds bilaterally.  Abd:  No distention.  Soft, nontender.  No rebound or guarding. Other:  Patient denies any focal weakness but states generalized weakness sensation.  Cannot do focal testing of the right upper extremity given a pressure dressing on the right upper extremity cath access site.   ED Results / Procedures / Treatments   RADIOLOGY  I have  reviewed and interpreted the CT scan of the head I do not appreciate any bleed on evaluation of the images. Radiology has read the MRI is negative for acute process.   MEDICATIONS ORDERED IN ED: Medications - No data to display   IMPRESSION / MDM / ASSESSMENT AND PLAN / ED COURSE  I reviewed the triage vital signs and the nursing notes.  Patient's presentation is most consistent with acute presentation with potential threat to life or bodily function.  Patient presents to the emergency department as a code stroke for acute generalized weakness and speech difficulty.  Patient has been seen by neurology.  CT scan of the head does not appear to show any acute abnormality on my evaluation of the images.  Radiology has read the MRI is negative for acute abnormality.  Lab work from today shows a reassuring CBC, reassuring chemistry, no significant findings.  Will obtain an EKG.  Patient has a reassuring MRI is quite anxious appearing.  Will dose a small dose of Ativan, await neurology for further recommendations.  Patient is feeling much better after Ativan.  Her speech is clear.  Patient states she is feeling well and wishes to go home.  I spoke to  neurology they are okay with the patient going home from their standpoint.  I spoke to Dr. Kirke Corin of cardiology who is also okay with the patient going home from his standpoint.  Given the patient's reassuring workup negative MRI and now resolution of symptoms following Ativan I believe the patient safe for discharge home with outpatient follow-up.  Patient agreeable to plan of care.  FINAL CLINICAL IMPRESSION(S) / ED DIAGNOSES   Weakness Anxiety   Note:  This document was prepared using Dragon voice recognition software and may include unintentional dictation errors.   Minna Antis, MD 05/15/23 1401

## 2023-05-15 NOTE — Progress Notes (Signed)
   05/15/23 1000  Spiritual Encounters  Type of Visit Initial  Care provided to: [facility] partners present during encounter Nurse  Referral source Code page  Reason for visit Code  OnCall Visit No     05/15/23 1000  Spiritual Encounters  Type of Visit Initial  Care provided to: [facility] partners present during encounter Nurse  Referral source Code page  Reason for visit Code  OnCall Visit No   Chaplain responded to Medical Alert in [facility] Recovery.  Patient is being cared for by the interdisciplinary team and taken to CT and MRI. Chaplain established a trust relationship with family and provided compassionate presence, empathy, active listening and prayer.  Family noted feeling more calm and appreciative. Chaplain spiritual support services remain available as the need arises.

## 2023-05-15 NOTE — OR Nursing (Signed)
Pt suddenly unable to speak clearly became tachycardic left facial droop unable to follow commands either arms. Bp 186/102

## 2023-05-17 ENCOUNTER — Other Ambulatory Visit: Payer: Self-pay | Admitting: Nurse Practitioner

## 2023-05-18 ENCOUNTER — Encounter: Payer: Self-pay | Admitting: Nurse Practitioner

## 2023-05-19 ENCOUNTER — Encounter: Payer: Self-pay | Admitting: Nurse Practitioner

## 2023-05-19 ENCOUNTER — Ambulatory Visit: Payer: 59 | Admitting: Nurse Practitioner

## 2023-05-19 VITALS — BP 118/72 | HR 92 | Temp 97.7°F | Ht 63.0 in | Wt 169.6 lb

## 2023-05-19 DIAGNOSIS — E876 Hypokalemia: Secondary | ICD-10-CM | POA: Diagnosis not present

## 2023-05-19 DIAGNOSIS — R2990 Unspecified symptoms and signs involving the nervous system: Secondary | ICD-10-CM | POA: Diagnosis not present
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
## 2023-05-20 ENCOUNTER — Other Ambulatory Visit: Payer: Self-pay

## 2023-05-20 LAB — POTASSIUM: Potassium: 3.8 meq/L (ref 3.5–5.1)

## 2023-05-21 ENCOUNTER — Encounter: Payer: Self-pay | Admitting: Nurse Practitioner

## 2023-05-21 ENCOUNTER — Telehealth: Payer: Self-pay | Admitting: Nurse Practitioner

## 2023-05-21 NOTE — Telephone Encounter (Signed)
Cpap titration  order faxed to [facility]; [phone]

## 2023-05-22 DIAGNOSIS — R2990 Unspecified symptoms and signs involving the nervous system: Secondary | ICD-10-CM | POA: Insufficient documentation

## 2023-05-22 NOTE — Assessment & Plan Note (Signed)
Recent episode of left-sided facial droop, speech difficulty, and confusion following cardiac catheterization. Symptoms resolved, suggesting a TIA. Patient has a history of stroke in 2018. -Continue current management and monitor for any new neurological symptoms.

## 2023-05-22 NOTE — Assessment & Plan Note (Signed)
Recent lab results showed slightly low potassium levels. -Check potassium levels today. -If potassium remains low, start potassium supplement. -Provide patient with a list of potassium-rich foods to incorporate into diet.

## 2023-05-25 ENCOUNTER — Other Ambulatory Visit: Payer: Self-pay | Admitting: Nurse Practitioner

## 2023-05-25 ENCOUNTER — Other Ambulatory Visit: Payer: Self-pay

## 2023-05-25 DIAGNOSIS — F32A Depression, unspecified: Secondary | ICD-10-CM

## 2023-05-26 MED ORDER — DIAZEPAM 5 MG PO TABS: 5.0000 mg | ORAL_TABLET | Freq: Two times a day (BID) | ORAL | 0 refills | Status: DC | PRN

## 2023-05-29 ENCOUNTER — Encounter: Payer: Self-pay | Admitting: Nurse Practitioner

## 2023-05-29 ENCOUNTER — Ambulatory Visit: Payer: 59 | Admitting: Medical

## 2023-06-03 ENCOUNTER — Other Ambulatory Visit: Payer: Self-pay | Admitting: Internal Medicine

## 2023-06-03 ENCOUNTER — Other Ambulatory Visit: Payer: Self-pay | Admitting: Nurse Practitioner

## 2023-06-03 DIAGNOSIS — J4520 Mild intermittent asthma, uncomplicated: Secondary | ICD-10-CM

## 2023-06-04 ENCOUNTER — Other Ambulatory Visit: Payer: Self-pay

## 2023-06-04 ENCOUNTER — Ambulatory Visit: Payer: 59 | Admitting: Nurse Practitioner

## 2023-06-04 MED ORDER — ONDANSETRON 4 MG PO TBDP: 4.0000 mg | ORAL_TABLET | Freq: Three times a day (TID) | ORAL | 1 refills | Status: DC | PRN

## 2023-06-08 ENCOUNTER — Ambulatory Visit: Payer: 59 | Admitting: Nurse Practitioner

## 2023-06-11 ENCOUNTER — Telehealth (INDEPENDENT_AMBULATORY_CARE_PROVIDER_SITE_OTHER): Payer: 59 | Admitting: Nurse Practitioner

## 2023-06-11 ENCOUNTER — Encounter: Payer: Self-pay | Admitting: Nurse Practitioner

## 2023-06-11 VITALS — BP 115/85 | HR 65 | Ht 63.0 in | Wt 169.0 lb

## 2023-06-11 DIAGNOSIS — R0602 Shortness of breath: Secondary | ICD-10-CM | POA: Diagnosis not present

## 2023-06-11 DIAGNOSIS — J432 Centrilobular emphysema: Secondary | ICD-10-CM

## 2023-06-11 DIAGNOSIS — G4733 Obstructive sleep apnea (adult) (pediatric): Secondary | ICD-10-CM | POA: Diagnosis not present

## 2023-06-11 MED ORDER — TRELEGY ELLIPTA 200-62.5-25 MCG/ACT IN AEPB: 1.0000 | INHALATION_SPRAY | Freq: Every day | RESPIRATORY_TRACT | 11 refills | Status: AC
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
## 2023-06-22 ENCOUNTER — Ambulatory Visit: Payer: 59 | Attending: Medical | Admitting: Medical

## 2023-06-22 ENCOUNTER — Encounter: Payer: Self-pay | Admitting: Medical

## 2023-06-22 ENCOUNTER — Other Ambulatory Visit: Payer: Self-pay | Admitting: Nurse Practitioner

## 2023-06-22 VITALS — BP 133/71 | HR 82 | Ht 63.0 in | Wt 171.8 lb

## 2023-06-22 DIAGNOSIS — G4733 Obstructive sleep apnea (adult) (pediatric): Secondary | ICD-10-CM

## 2023-06-22 DIAGNOSIS — R0609 Other forms of dyspnea: Secondary | ICD-10-CM | POA: Diagnosis not present

## 2023-06-22 DIAGNOSIS — I2510 Atherosclerotic heart disease of native coronary artery without angina pectoris: Secondary | ICD-10-CM | POA: Diagnosis not present

## 2023-06-22 DIAGNOSIS — I471 Supraventricular tachycardia, unspecified: Secondary | ICD-10-CM | POA: Diagnosis not present

## 2023-06-22 DIAGNOSIS — E782 Mixed hyperlipidemia: Secondary | ICD-10-CM | POA: Diagnosis not present

## 2023-06-22 DIAGNOSIS — I10 Essential (primary) hypertension: Secondary | ICD-10-CM | POA: Diagnosis not present
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
## 2023-06-22 NOTE — Patient Instructions (Signed)
 Medication Instructions:  Your physician recommends that you continue on your current medications as directed. Please refer to the Current Medication list given to you today.   *If you need a refill on your cardiac medications before your next appointment, please call your pharmacy*   Lab Work: No labs ordered today    Testing/Procedures: No test ordered today    Follow-Up: At [facility], you and your health needs are our priority.  As part of our continuing mission to provide you with exceptional heart care, we have created designated Provider Care Teams.  These Care Teams include your primary Cardiologist (physician) and Advanced Practice Providers (APPs -  Physician Assistants and Nurse Practitioners) who all work together to provide you with the care you need, when you need it.  We recommend signing up for the patient portal called "MyChart".  Sign up information is provided on this After Visit Summary.  MyChart is used to connect with patients for Virtual Visits (Telemedicine).  Patients are able to view lab/test results, encounter notes, upcoming appointments, etc.  Non-urgent messages can be sent to your provider as well.   To learn more about what you can do with MyChart, go to [email or web address].    Your next appointment:   6 month(s)  Provider:   You may see Lorine Bears, MD or one of the following Advanced Practice Providers on your designated Care Team:   Nicolasa Ducking, NP Eula Listen, PA-C Cadence Fransico Michael, PA-C Charlsie Quest, NP Carlos Levering, NP

## 2023-06-26 ENCOUNTER — Encounter: Payer: Self-pay | Admitting: Nurse Practitioner

## 2023-06-29 ENCOUNTER — Other Ambulatory Visit: Payer: Self-pay | Admitting: Nurse Practitioner

## 2023-07-16 DIAGNOSIS — J301 Allergic rhinitis due to pollen: Secondary | ICD-10-CM | POA: Diagnosis not present

## 2023-07-22 ENCOUNTER — Telehealth: Payer: Self-pay | Admitting: Nurse Practitioner

## 2023-07-22 ENCOUNTER — Encounter (INDEPENDENT_AMBULATORY_CARE_PROVIDER_SITE_OTHER): Payer: Self-pay | Admitting: Internal Medicine

## 2023-07-22 DIAGNOSIS — G4733 Obstructive sleep apnea (adult) (pediatric): Secondary | ICD-10-CM

## 2023-07-22 NOTE — Telephone Encounter (Signed)
 SS appointment 07/22/23 @ [facility]

## 2023-07-30 ENCOUNTER — Telehealth: Payer: Self-pay | Admitting: Nurse Practitioner

## 2023-07-30 DIAGNOSIS — J301 Allergic rhinitis due to pollen: Secondary | ICD-10-CM | POA: Diagnosis not present

## 2023-07-30 NOTE — Telephone Encounter (Signed)
 Received Cpap/Bipap order from [facility]. Gave to [name] for signature-[place]

## 2023-08-03 ENCOUNTER — Telehealth: Payer: Self-pay

## 2023-08-03 ENCOUNTER — Telehealth: Payer: Self-pay | Admitting: Nurse Practitioner

## 2023-08-03 MED ORDER — LOSARTAN POTASSIUM 100 MG PO TABS: 100.0000 mg | ORAL_TABLET | Freq: Every day | ORAL | 5 refills | Status: DC

## 2023-08-03 NOTE — Telephone Encounter (Signed)
Called and informed patient and she verbalized understanding.

## 2023-08-03 NOTE — Telephone Encounter (Signed)
 Sure I can if there is an opening to see her after clearance  RV

## 2023-08-03 NOTE — Telephone Encounter (Signed)
 Cpap order signed. Faxed back to [facility]; [phone]. Scanned.

## 2023-08-03 NOTE — Telephone Encounter (Signed)
 Pt was seen by Dr. Ole Berkeley in 2023. Per pt she was told to come see Dr. Baldomero Bone if she wanted to do the banding. Pt states she was very sick with covid as it cause her a lot of health issue. Therefore she never returned over the years to have banding done.  Pt states  now she is ready to get banding complete. Do I add her on just for banding or consult or neither at this time?

## 2023-08-03 NOTE — Telephone Encounter (Signed)
   [facility] Pre-operative Risk Assessment    Request for surgical clearance:  What type of surgery is being performed? Hemorrhoid Banding    When is this surgery scheduled? When we get back the blood thinner clearance    Are there any medications that need to be held prior to surgery and how long?Plavix 75mg    Practice name and name of physician performing surgery? Rohini Baldomero Bone [facility]    What is your office phone and fax number? [phone]   Anesthesia type (None, local, MAC, general) ? General    [name] 08/03/2023, 1:40 PM  _________________________________________________________________   (provider comments below)

## 2023-08-03 NOTE — Telephone Encounter (Signed)
 I have to get blood thinner clearance before we can do the hemorrhoid banding because patient is on Plavix. Will sent blood thinner clearance to cardiologist and see what the cardiologist advise.     Dr. Baldomero Bone are you okay with doing Hemorrhoid banding on patient after we get blood thinner clearance. Patient was seen on 12/16/2022

## 2023-08-05 ENCOUNTER — Ambulatory Visit: Admitting: Nurse Practitioner

## 2023-08-06 ENCOUNTER — Encounter: Payer: Self-pay | Admitting: Nurse Practitioner

## 2023-08-06 DIAGNOSIS — J432 Centrilobular emphysema: Secondary | ICD-10-CM

## 2023-08-06 NOTE — Procedures (Signed)
 [facility]  Polysomnogram Report Part I  Phone: [phone] Fax: [phone]  Patient Name: [name], [name] Acquisition Number: [id number]  Date of Birth: Nov 08, 1960 Acquisition Date: 07/22/2023  Referring Physician: Sallyanne Kuster FNP-C     History: The patient is a 63 year old  . Medical History: anxiety, bronchitis COPD, coronary, artery disease, GERD, HPV, hypertension, MI, stroke, excessive daytime sleepiness.  Medications: aspirin 81, atorvastatin, azelastine, pulmicort, buropion, Plavix, diazepam, fluticasone, Advair, furosemide, levalbuterol, losartan, metronidazole, nystatin, ondansetron, pantoprazole, tramadol, nitroglycerin.  Procedure: This routine overnight polysomnogram was performed on the [facility] 5 using the standard CPAP protocol. This included 6 channels of EEG, 2 channels of EOG, chin EMG, bilateral anterior tibialis EMG, nasal/oral thermistor, PTAF (nasal pressure transducer), chest and abdominal wall movements, EKG, and pulse oximetry.  Description: The total recording time was 323.5 minutes. The total sleep time was 129.5 minutes. There were a total of 142.0 minutes of wakefulness after sleep onset for a poorsleep efficiency of 40.0%. The latency to sleep onset wasprolonged at 52.0 minutes. The R sleep onset latency was N/A.  Sleep parameters, as a percentage of the total sleep time, demonstrated 15.8% of sleep was in N1 sleep, 40.5% N2, 43.6% N3 and 0.0% R sleep. There were a total of 6 arousals for an arousal index of 2.8 arousals per hour of sleep that was normal.  Overall, there were a total of 9 respiratory events for a respiratory disturbance index, which includes apneas, hypopneas and RERAs (increased respiratory effort) of 4.2 respiratory events per hour of sleep during the pressure titration.  was initiated at 4 cm H2O at lights out, 10:20 p.m. It was titrated in 1 cm increments for occasional obstructive events to the final pressure of 6 cm H2O. Some  obstructive events were still observed at the final pressure and REM sleep was not observed.  Additionally, the baseline oxygen saturation during wakefulness was 96%, during NREM sleep averaged 93%, and during REM sleep averaged N/A. The total duration of oxygen < 90% was 0.8 minutes.  Cardiac monitoring-   significant cardiac rhythm irregularities.   Periodic limb movement monitoring- did not demonstrate periodic limb movements.     Impression: This patient's obstructive sleep apnea demonstrated significant improvement with the utilization of nasal , however some obstructive events were still observed at the final pressure and REM sleep was not observed. The apnea was controlled  at 4 cm H2O in the right, lateral position  Recommendations: Would recommend utilization of auto-adjusting  at 4-14 cm H2O.      Avoiding the supine sleep position may help minimize pressure requirements. An AirFit F30 mask, size Medium , was used. Chin strap used during study- No. Humidifier used during study- Yes.     Yevonne Pax, MD, [facility] Diplomate ABMS-Pulmonary, Critical Care and Sleep Medicine  Electronically reviewed and digitally signed  [facility] CPAP/BIPAP Polysomnogram Report Part II Phone: [phone] Fax: [phone]  Patient last name Gales Neck Size  16  in. Acquisition [phone]  Patient first name [name] Weight 165.0 lbs. Started 07/22/2023 at 10:14:49 PM  Birth date 05/10/1960 Height 63.0 in. Stopped 07/23/2023 at 3:52:49 AM  Age 2      Type Adult BMI 29.2 lb/in2 Duration 323.5  Robbi Garter. RPSGT. // [facility] Pickard Sleep Data: Lights Out: 10:20:19 PM Sleep Onset: 11:12:19 PM  Lights On: 3:43:49 AM Sleep Efficiency: 40.0 %  Total Recording Time: 323.5 min Sleep Latency (from Lights Off) 52.0 min  Total Sleep Time (  TST): 129.5 min R Latency (from Sleep Onset): N/A  Sleep Period Time: 165.5 min Total number of awakenings: 7  Wake during sleep: 36.0 min Wake After Sleep  Onset (WASO): 142.0 min   Sleep Data:         Arousal Summary: Stage  Latency from lights out (min) Latency from sleep onset (min) Duration (min) % Total Sleep Time  Normal values  N 1 52.0 0.0 20.5 15.8 (5%)  N 2 53.5 1.5 52.5 40.5 (50%)  N 3 62.5 10.5 56.5 43.6 (20%)  R N/A N/A 0.0 0.0 (25%)   Number Index  Spontaneous 6 2.8  Apneas & Hypopneas 0 0.0  RERAs 0 0.0       (Apneas & Hypopneas & RERAs)  (0) (0.0)  Limb Movement 0 0.0  Snore 0 0.0  TOTAL 6 2.8     Respiratory Data:  CA OA MA Apnea Hypopnea* A+ H RERA Total  Number 0 0 0 0 9 9 0 9  Mean Dur (sec) 0.0 0.0 0.0 0.0 16.1 16.1 0.0 16.1  Max Dur (sec) 0.0 0.0 0.0 0.0 18.5 18.5 0.0 18.5  Total Dur (min) 0.0 0.0 0.0 0.0 2.4 2.4 0.0 2.4  % of TST 0.0 0.0 0.0 0.0 1.9 1.9 0.0 1.9  Index (#/h TST) 0.0 0.0 0.0 0.0 4.2 4.2 0.0 4.2  *Hypopneas scored based on 4% or greater desaturation.  Sleep Stage:         REM NREM TST  AHI N/A 4.2 4.2  RDI N/A 4.2 4.2   Sleep (min) TST (%) REM (min) NREM (min) CA (#) OA (#) MA (#) HYP (#) AHI (#/h) RERA (#) RDI (#/h) Desat (#)  Supine 9.0 6.95 0.0 9.0 0 0 0 3 20.0 0 20.0 3  Non-Supine 120.50 93.05 0.00 120.50 0.00 0.00 0.00 6.00 2.99 0 2.99 11.00  Left: 33.5 25.87 0.0 33.5 0 0 0 2 3.6 0 3.6 5  Right: 87.0 67.18 0.0 87.0 0 0 0 4 2.8 0 2.8 6     Snoring: Total number of snoring episodes  0  Total time with snoring    min (   % of sleep)   Oximetry Distribution:             WK REM NREM TOTAL  Average (%)   96    93 94  < 90% 0.8 0.0 0.0 0.8  < 80% 0.0 0.0 0.0 0.0  < 70% 0.0 0.0 0.0 0.0  # of Desaturations* 2 0 13 15  Desat Index (#/hour) 0.9    6.0 6.9  Desat Max (%) 6 0 8 8  Desat Max Dur (sec) 16.0 0.0 49.0 49.0  Approx Min O2 during sleep 89  Approx min O2 during a respiratory event 86  Was Oxygen added (Y/N) and final rate :    LPM  *Desaturations based on 4% or greater drop from baseline.   Cheyne Stokes Breathing: None Present    Heart Rate Summary:   Average Heart Rate During Sleep 70.7 bpm      Highest Heart Rate During Sleep (95th %) 79.0 bpm      Highest Heart Rate During Sleep 84 bpm      Highest Heart Rate During Recording (TIB) 255 bpm (artifact)   Heart Rate Observations: Event Type # Events   Bradycardia 0 Lowest HR Scored: N/A  Sinus Tachycardia During Sleep 0 Highest HR Scored: N/A  Narrow Complex Tachycardia 0 Highest HR Scored: N/A  Wide Complex  Tachycardia 0 Highest HR Scored: N/A  Asystole 0 Longest Pause: N/A  Atrial Fibrillation 0 Duration Longest Event: N/A  Other Arrythmias   Type:   Periodic Limb Movement Data: (Primary legs unless otherwise noted) Total # Limb Movement 0 Limb Movement Index 0.0  Total # PLMS    PLMS Index     Total # PLMS Arousals    PLMS Arousal Index     Percentage Sleep Time with PLMS   min (   % sleep)  Mean Duration limb movements (secs)       IPAP Level (cmH2O) EPAP Level (cmH2O) Total Duration (min) Sleep Duration (min) Sleep (%) REM (%) CA  #) OA # MA # HYP #) AHI (#/hr) RERAs # RERAs (#/hr) RDI (#/hr)  4 4 101.1 82.1 81.2 0.0 0 0 0 3 2.2 0 0.0 2.2  5 5  19.3 10.3 53.4 0.0 0 0 0 3 17.5 0 0.0 17.5  6 6  44.4 36.4 82.0 0.0 0 0 0 3 4.9 0 0.0 4.9

## 2023-08-07 NOTE — Telephone Encounter (Signed)
   Patient Name: [name]  DOB: Nov 23, 1960 MRN: [id number]  Primary Cardiologist: Antionette Kirks, MD  Chart reviewed as part of pre-operative protocol coverage.   Per protocol patient can hold Plavix  5 days before procedure and should restart postprocedure when surgically safe and hemostasis is achieved.   Francene Ing, Retha Cast, NP 08/07/2023, 8:25 AM

## 2023-08-07 NOTE — Telephone Encounter (Signed)
 Hold Plavix 5 days before.

## 2023-08-10 ENCOUNTER — Other Ambulatory Visit: Payer: Self-pay | Admitting: Nurse Practitioner

## 2023-08-10 DIAGNOSIS — R0609 Other forms of dyspnea: Secondary | ICD-10-CM

## 2023-08-10 DIAGNOSIS — J432 Centrilobular emphysema: Secondary | ICD-10-CM

## 2023-08-11 ENCOUNTER — Other Ambulatory Visit: Payer: Self-pay | Admitting: *Deleted

## 2023-08-11 DIAGNOSIS — J432 Centrilobular emphysema: Secondary | ICD-10-CM

## 2023-08-12 ENCOUNTER — Telehealth: Payer: Self-pay

## 2023-08-12 NOTE — Telephone Encounter (Signed)
 Received fax request for Furosemide  20 mg take half of a tablet daily. Patient states she does need this. Patient states if you will fill it this time then she will ask Cardiology to take over it. LOV 05/16/23  Last fill 08/15/22

## 2023-08-13 ENCOUNTER — Other Ambulatory Visit: Payer: Self-pay | Admitting: Nurse Practitioner

## 2023-08-13 MED ORDER — FUROSEMIDE 20 MG PO TABS: 10.0000 mg | ORAL_TABLET | Freq: Every day | ORAL | 0 refills | Status: DC

## 2023-08-13 NOTE — Telephone Encounter (Signed)
 Please inform pt  Furosemide  has been refilled.

## 2023-08-13 NOTE — Progress Notes (Signed)
 Furosemide refilled.

## 2023-08-13 NOTE — Telephone Encounter (Signed)
 Called and informed pt that refill has been sent in.

## 2023-08-17 ENCOUNTER — Other Ambulatory Visit: Payer: Self-pay

## 2023-08-17 ENCOUNTER — Other Ambulatory Visit: Payer: Self-pay | Admitting: Nurse Practitioner

## 2023-08-17 ENCOUNTER — Encounter: Attending: Internal Medicine

## 2023-08-17 DIAGNOSIS — J439 Emphysema, unspecified: Secondary | ICD-10-CM

## 2023-08-17 NOTE — Progress Notes (Signed)
 Virtual Visit completed. Patient informed on EP and RD appointment and 6 Minute walk test. Patient also informed of patient health questionnaires on My Chart. Patient Verbalizes understanding. Visit diagnosis can be found in [facility] 06/11/2023.

## 2023-08-20 ENCOUNTER — Encounter: Attending: Internal Medicine

## 2023-08-20 ENCOUNTER — Ambulatory Visit: Admitting: Nurse Practitioner

## 2023-08-20 VITALS — Ht 64.0 in | Wt 170.9 lb

## 2023-08-20 DIAGNOSIS — J439 Emphysema, unspecified: Secondary | ICD-10-CM | POA: Diagnosis not present

## 2023-08-20 NOTE — Patient Instructions (Addendum)
 Patient Instructions  Patient Details  Name: [name] MRN: [id number] Date of Birth: Mar 17, 1961 Referring Provider:  Cordie Deters, MD  Below are your personal goals for exercise, nutrition, and risk factors. Our goal is to help you stay on track towards obtaining and maintaining these goals. We will be discussing your progress on these goals with you throughout the program.  Initial Exercise Prescription:  Initial Exercise Prescription - 08/20/23 0900       Date of Initial Exercise RX and Referring Provider   Date 08/20/23    Referring Provider Dr. Cam Cava, MD      Oxygen   Maintain Oxygen Saturation 88% or higher      Treadmill   MPH 1.6    Grade 0    Minutes 15    METs 2.23      Recumbant Bike   Level 1    RPM 50    Watts 15    Minutes 15    METs 2.32      NuStep   Level 2    SPM 80    Minutes 15    METs 2.32      Prescription Details   Frequency (times per week) 2    Duration Progress to 30 minutes of continuous aerobic without signs/symptoms of physical distress      Intensity   THRR 40-80% of Max Heartrate 116-144    Ratings of Perceived Exertion 11-13    Perceived Dyspnea 0-4      Progression   Progression Continue to progress workloads to maintain intensity without signs/symptoms of physical distress.      Resistance Training   Training Prescription Yes    Weight 3 lb    Reps 10-15             Exercise Goals: Frequency: Be able to perform aerobic exercise two to three times per week in program working toward 2-5 days per week of home exercise.  Intensity: Work with a perceived exertion of 11 (fairly light) - 15 (hard) while following your exercise prescription.  We will make changes to your prescription with you as you progress through the program.   Duration: Be able to do 30 to 45 minutes of continuous aerobic exercise in addition to a 5 minute warm-up and a 5 minute cool-down routine.   Nutrition Goals: Your personal nutrition  goals will be established when you do your nutrition analysis with the dietician.  The following are general nutrition guidelines to follow: Cholesterol < 200mg /day Sodium < 1500mg /day Fiber: Women over 50 yrs - 21 grams per day  Personal Goals:  Personal Goals and Risk Factors at Admission - 08/17/23 1521       Core Components/Risk Factors/Patient Goals on Admission    Weight Management Yes;Weight Loss    Intervention Weight Management: Develop a combined nutrition and exercise program designed to reach desired caloric intake, while maintaining appropriate intake of nutrient and fiber, sodium and fats, and appropriate energy expenditure required for the weight goal.;Weight Management: Provide education and appropriate resources to help participant work on and attain dietary goals.;Weight Management/Obesity: Establish reasonable short term and long term weight goals.    Expected Outcomes Short Term: Continue to assess and modify interventions until short term weight is achieved;Weight Loss: Understanding of general recommendations for a balanced deficit meal plan, which promotes 1-2 lb weight loss per week and includes a negative energy balance of [phone] kcal/d;Understanding recommendations for meals to include 15-35% energy as protein, 25-35%  energy from fat, 35-60% energy from carbohydrates, less than 200mg  of dietary cholesterol, 20-35 gm of total fiber daily;Understanding of distribution of calorie intake throughout the day with the consumption of 4-5 meals/snacks    Hypertension Yes    Intervention Provide education on lifestyle modifcations including regular physical activity/exercise, weight management, moderate sodium restriction and increased consumption of fresh fruit, vegetables, and low fat dairy, alcohol  moderation, and smoking cessation.;Monitor prescription use compliance.    Expected Outcomes Short Term: Continued assessment and intervention until BP is < 140/4mm HG in  hypertensive participants. < 130/66mm HG in hypertensive participants with diabetes, heart failure or chronic kidney disease.;Long Term: Maintenance of blood pressure at goal levels.            Exercise Goals and Review:  Exercise Goals     Row Name 08/20/23 [phone]             Exercise Goals   Increase Physical Activity Yes       Intervention Provide advice, education, support and counseling about physical activity/exercise needs.;Develop an individualized exercise prescription for aerobic and resistive training based on initial evaluation findings, risk stratification, comorbidities and participant's personal goals.       Expected Outcomes Short Term: Attend rehab on a regular basis to increase amount of physical activity.;Long Term: Add in home exercise to make exercise part of routine and to increase amount of physical activity.;Long Term: Exercising regularly at least 3-5 days a week.       Increase Strength and Stamina Yes       Intervention Provide advice, education, support and counseling about physical activity/exercise needs.;Develop an individualized exercise prescription for aerobic and resistive training based on initial evaluation findings, risk stratification, comorbidities and participant's personal goals.       Expected Outcomes Short Term: Increase workloads from initial exercise prescription for resistance, speed, and METs.;Short Term: Perform resistance training exercises routinely during rehab and add in resistance training at home;Long Term: Improve cardiorespiratory fitness, muscular endurance and strength as measured by increased METs and functional capacity ( )       Able to understand and use rate of perceived exertion (RPE) scale Yes       Intervention Provide education and explanation on how to use RPE scale       Expected Outcomes Short Term: Able to use RPE daily in rehab to express subjective intensity level;Long Term:  Able to use RPE to guide intensity level  when exercising independently       Able to understand and use Dyspnea scale Yes       Intervention Provide education and explanation on how to use Dyspnea scale       Expected Outcomes Short Term: Able to use Dyspnea scale daily in rehab to express subjective sense of shortness of breath during exertion;Long Term: Able to use Dyspnea scale to guide intensity level when exercising independently       Knowledge and understanding of Target Heart Rate Range (THRR) Yes       Intervention Provide education and explanation of THRR including how the numbers were predicted and where they are located for reference       Expected Outcomes Long Term: Able to use THRR to govern intensity when exercising independently;Short Term: Able to state/look up THRR;Short Term: Able to use daily as guideline for intensity in rehab       Able to check pulse independently Yes       Intervention Review the importance of being able  to check your own pulse for safety during independent exercise;Provide education and demonstration on how to check pulse in carotid and radial arteries.       Expected Outcomes Short Term: Able to explain why pulse checking is important during independent exercise;Long Term: Able to check pulse independently and accurately       Understanding of Exercise Prescription Yes       Intervention Provide education, explanation, and written materials on patient's individual exercise prescription       Expected Outcomes Short Term: Able to explain program exercise prescription;Long Term: Able to explain home exercise prescription to exercise independently

## 2023-08-20 NOTE — Progress Notes (Signed)
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 a weekly basis, covering required topics. Participant will state understanding/return demonstration of topics presented.  Learning Barriers/Preferences:  Learning Barriers/Preferences - 08/17/23 1521       Learning Barriers/Preferences   Learning Barriers None    Learning Preferences None             General Pulmonary Education Topics:  Infection Prevention: - Provides verbal and written material to individual with discussion of infection control including proper hand washing and proper equipment cleaning during exercise session. Flowsheet Row  Pulmonary Rehab from 08/20/2023 in [facility] Cardiac and Pulmonary Rehab  Date 08/17/23  Educator jh  Instruction Review Code 1- Verbalizes Understanding       Falls Prevention: - Provides verbal and written material to individual with discussion of falls  prevention and safety. Flowsheet Row Pulmonary Rehab from 08/20/2023 in [facility] Cardiac and Pulmonary Rehab  Date 08/17/23  Educator jh  Instruction Review Code 1- Verbalizes Understanding       Chronic Lung Disease Review: - Group verbal instruction with posters, models, PowerPoint presentations and videos,  to review new updates, new respiratory medications, new advancements in procedures and treatments. Providing information on websites and "800" numbers for continued self-education. Includes information about supplement oxygen, available portable oxygen systems, continuous and intermittent flow rates, oxygen safety, concentrators, and Medicare reimbursement for oxygen. Explanation of Pulmonary Drugs, including class, frequency, complications, importance of spacers, rinsing mouth after steroid MDI's, and proper cleaning methods for nebulizers. Review of basic lung anatomy and physiology related to function, structure, and complications of lung disease. Review of risk factors. Discussion about methods for diagnosing sleep apnea and types of masks and machines for OSA. Includes a review of the use of types of environmental controls: home humidity, furnaces, filters, dust mite/pet prevention, HEPA vacuums. Discussion about weather changes, air quality and the benefits of nasal washing. Instruction on Warning signs, infection symptoms, calling MD promptly, preventive modes, and value of vaccinations. Review of effective airway clearance, coughing and/or vibration techniques. Emphasizing that all should Create an Action Plan. Written material given at graduation. Flowsheet Row Pulmonary Rehab from 08/20/2023 in [facility] Cardiac and Pulmonary Rehab  Education need identified 08/20/23       AED/CPR: - Group verbal and written instruction with the use of models to demonstrate the basic use of the AED with the basic ABC's of resuscitation.    Anatomy and Cardiac Procedures: - Group verbal and visual  presentation and models provide information about basic cardiac anatomy and function. Reviews the testing methods done to diagnose heart disease and the outcomes of the test results. Describes the treatment choices: Medical Management, Angioplasty, or Coronary Bypass Surgery for treating various heart conditions including Myocardial Infarction, Angina, Valve Disease, and Cardiac Arrhythmias.  Written material given at graduation.   Medication Safety: - Group verbal and visual instruction to review commonly prescribed medications for heart and lung disease. Reviews the medication, class of the drug, and side effects. Includes the steps to properly store meds and maintain the prescription regimen.  Written material given at graduation. Flowsheet Row Pulmonary Rehab from 08/20/2023 in [facility] Cardiac and Pulmonary Rehab  Education need identified 08/20/23       Other: -Provides group and verbal instruction on various topics (see comments)   Knowledge Questionnaire Score:  Knowledge Questionnaire Score - 08/20/23 0952       Knowledge Questionnaire Score   Pre Score 14/18              Core Components/Risk Factors/Patient Goals at Admission:  Personal Goals and Risk Factors at Admission - 08/17/23 1521       Core Components/Risk Factors/Patient Goals on Admission    Weight Management Yes;Weight Loss    Intervention Weight Management: Develop a combined nutrition and exercise program designed to reach desired caloric intake, while maintaining appropriate intake of nutrient and fiber, sodium and fats, and appropriate energy expenditure required for the weight goal.;Weight Management: Provide education and appropriate resources to help participant work on and attain dietary goals.;Weight Management/Obesity: Establish reasonable short  term and long term weight goals.    Expected Outcomes Short Term: Continue to assess and modify interventions until short term weight is achieved;Weight Loss:  Understanding of general recommendations for a balanced deficit meal plan, which promotes 1-2 lb weight loss per week and includes a negative energy balance of [phone] kcal/d;Understanding recommendations for meals to include 15-35% energy as protein, 25-35% energy from fat, 35-60% energy from carbohydrates, less than 200mg  of dietary cholesterol, 20-35 gm of total fiber daily;Understanding of distribution of calorie intake throughout the day with the consumption of 4-5 meals/snacks    Hypertension Yes    Intervention Provide education on lifestyle modifcations including regular physical activity/exercise, weight management, moderate sodium restriction and increased consumption of fresh fruit, vegetables, and low fat dairy, alcohol  moderation, and smoking cessation.;Monitor prescription use compliance.    Expected Outcomes Short Term: Continued assessment and intervention until BP is < 140/13mm HG in hypertensive participants. < 130/70mm HG in hypertensive participants with diabetes, heart failure or chronic kidney disease.;Long Term: Maintenance of blood pressure at goal levels.             Education:Diabetes - Individual verbal and written instruction to review signs/symptoms of diabetes, desired ranges of glucose level fasting, after meals and with exercise. Acknowledge that pre and post exercise glucose checks will be done for 3 sessions at entry of program.   Know Your Numbers and Heart Failure: - Group verbal and visual instruction to discuss disease risk factors for cardiac and pulmonary disease and treatment options.  Reviews associated critical values for Overweight/Obesity, Hypertension, Cholesterol, and Diabetes.  Discusses basics of heart failure: signs/symptoms and treatments.  Introduces Heart Failure Zone chart for action plan for heart failure.  Written material given at graduation.   Core Components/Risk Factors/Patient Goals Review:    Core Components/Risk Factors/Patient  Goals at Discharge (Final Review):    ITP Comments:  ITP Comments     Row Name 08/17/23 1518 08/20/23 0906         ITP Comments Virtual Visit completed. Patient informed on EP and RD appointment and 6 Minute walk test. Patient also informed of patient health questionnaires on My Chart. Patient Verbalizes understanding. Visit diagnosis can be found in [facility] 06/11/2023. Completed and gym orientation for respiratory care services. Initial ITP created and sent for review to Dr. Faud Aleskerov, Medical Director.               Comments: Initial ITP

## 2023-08-25 ENCOUNTER — Encounter: Admitting: *Deleted

## 2023-08-25 DIAGNOSIS — J439 Emphysema, unspecified: Secondary | ICD-10-CM | POA: Diagnosis not present

## 2023-08-25 NOTE — Progress Notes (Signed)
 Daily Session Note  Patient Details  Name: [name] MRN: [id number] Date of Birth: [birth year] Referring Provider:   Flowsheet Row Pulmonary Rehab from 08/20/2023 in [facility] Cardiac and Pulmonary Rehab  Referring Provider Dr. Cam Cava, MD       Encounter Date: 08/25/2023  Check In:  Session Check In - 08/25/23 0803       Check-In   Supervising physician immediately available to respond to emergencies See telemetry face sheet for immediately available ER MD    Location [facility]-Cardiac & Pulmonary Rehab    Staff Present Lyell Samuel, MS, Exercise Physiologist;Mortimer Bair, RN, BSN, CCRP;Noah Tickle, BS, Exercise Physiologist    Virtual Visit No    Medication changes reported     No    Fall or balance concerns reported    No    Warm-up and Cool-down Performed on first and last piece of equipment    Resistance Training Performed Yes    VAD Patient? No    PAD/SET Patient? No      Pain Assessment   Currently in Pain? No/denies                Social History   Tobacco Use  Smoking Status Former   Current packs/day: 0.00   Average packs/day: 1 pack/day for 30.0 years (30.0 ttl pk-yrs)   Types: Cigarettes   Start date: 03/27/1986   Quit date: 03/27/2016   Years since quitting: 7.4  Smokeless Tobacco Former   Quit date: 05/18/2011    Goals Met:  Proper associated with RPD/PD & O2 Sat Independence with exercise equipment Exercise tolerated well No report of concerns or symptoms today  Goals Unmet:  Not Applicable  Comments: Pt able to follow exercise prescription today without complaint.  Will continue to monitor for progression.  First full day of exercise!  Patient was oriented to gym and equipment including functions, settings, policies, and procedures.  Patient's individual exercise prescription and treatment plan were reviewed.  All starting workloads were established based on the results of the 6 minute walk test done at initial orientation visit.  The plan for  exercise progression was also introduced and progression will be customized based on patient's performance and goals.   Dr. Firman Hughes is Medical Director for [facility] Cardiac Rehabilitation.  Dr. Fuad Aleskerov is Medical Director for [facility] Pulmonary Rehabilitation.

## 2023-08-26 ENCOUNTER — Encounter: Payer: Self-pay | Admitting: *Deleted

## 2023-08-26 DIAGNOSIS — J439 Emphysema, unspecified: Secondary | ICD-10-CM

## 2023-08-26 NOTE — Progress Notes (Signed)
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 in these areas.  A difference of  2-3 points is a clinically meaningful difference.  A difference of 2-3 points in the total score of the Quality of Life Index has been associated with significant improvement in overall quality of life, self-image, physical symptoms, and general health in studies assessing change in quality of life.  PHQ-9: Review Flowsheet  More data exists      08/20/2023 05/19/2023 04/02/2023 12/26/2022 06/12/2022  Depression screen PHQ 2/9  Decreased Interest 1 0 0 0 0  Down, Depressed, Hopeless 1 0 0 1 0  PHQ - 2 Score 2 0 0 1 0  Altered sleeping 3 2 3 2 2   Tired, decreased energy 3 0 2 3 2   Change in appetite 2 0 0 1 0  Feeling bad or failure about yourself  0 0 0 0 0  Trouble concentrating 1 0 0 0 0  Moving slowly or fidgety/restless 2 0 0 0 0  Suicidal  thoughts 0 0 0 0 0  PHQ-9 Score 13 2 5 7 4   Difficult doing work/chores Somewhat difficult Not difficult at all Somewhat difficult Somewhat difficult Not difficult at all   Interpretation of Total Score  Total Score Depression Severity:  1-4 = Minimal depression, 5-9 = Mild depression, 10-14 = Moderate depression, 15-19 = Moderately severe depression, 20-27 = Severe depression   Psychosocial Evaluation and Intervention:  Psychosocial Evaluation - 08/17/23 1525       Psychosocial Evaluation & Interventions   Interventions Encouraged to exercise with the program and follow exercise prescription;Relaxation education;Stress management education    Comments She 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 She can look to her son for support that lives with her. Her family is close by and she can rely on them.    Expected Outcomes Short: Start [facility] to help with mood. Long: Maintain a healthy mental state    Continue Psychosocial Services  Follow up required by staff             Psychosocial Re-Evaluation:   Psychosocial Discharge (Final Psychosocial Re-Evaluation):   Education: Education Goals: Education classes will be provided on a weekly basis, covering required topics. Participant will state understanding/return demonstration of topics presented.  Learning Barriers/Preferences:  Learning Barriers/Preferences - 08/17/23 1521       Learning Barriers/Preferences   Learning Barriers None    Learning Preferences None             General Pulmonary Education Topics:  Infection Prevention: - Provides verbal and written material to individual with discussion of infection control including proper hand washing and proper equipment cleaning during exercise session. Flowsheet Row Pulmonary Rehab from 08/20/2023 in [facility] Cardiac and Pulmonary Rehab  Date 08/17/23  Educator jh  Instruction Review Code 1-  Verbalizes Understanding       Falls Prevention: - Provides verbal and written material to individual with discussion of falls prevention and safety. Flowsheet Row Pulmonary Rehab from 08/20/2023 in [facility] Cardiac and Pulmonary Rehab  Date 08/17/23  Educator jh  Instruction Review Code 1- Verbalizes Understanding       Chronic Lung Disease Review: - Group verbal instruction with posters, models, PowerPoint presentations and videos,  to review new updates, new respiratory medications, new advancements in procedures and treatments. Providing information on websites and "800" numbers for continued self-education. Includes information about supplement oxygen, available portable oxygen systems, continuous  and intermittent flow rates, oxygen safety, concentrators, and Medicare reimbursement for oxygen. Explanation of Pulmonary Drugs, including class, frequency, complications, importance of spacers, rinsing mouth after steroid MDI's, and proper cleaning methods for nebulizers. Review of basic lung anatomy and physiology related to function, structure, and complications of lung disease. Review of risk factors. Discussion about methods for diagnosing sleep apnea and types of masks and machines for OSA. Includes a review of the use of types of environmental controls: home humidity, furnaces, filters, dust mite/pet prevention, HEPA vacuums. Discussion about weather changes, air quality and the benefits of nasal washing. Instruction on Warning signs, infection symptoms, calling MD promptly, preventive modes, and value of vaccinations. Review of effective airway clearance, coughing and/or vibration techniques. Emphasizing that all should Create an Action Plan. Written material given at graduation. Flowsheet Row Pulmonary Rehab from 08/20/2023 in [facility] Cardiac and Pulmonary Rehab  Education need identified 08/20/23       AED/CPR: - Group verbal and written instruction with the use of models to demonstrate the  basic use of the AED with the basic ABC's of resuscitation.    Anatomy and Cardiac Procedures: - Group verbal and visual presentation and models provide information about basic cardiac anatomy and function. Reviews the testing methods done to diagnose heart disease and the outcomes of the test results. Describes the treatment choices: Medical Management, Angioplasty, or Coronary Bypass Surgery for treating various heart conditions including Myocardial Infarction, Angina, Valve Disease, and Cardiac Arrhythmias.  Written material given at graduation.   Medication Safety: - Group verbal and visual instruction to review commonly prescribed medications for heart and lung disease. Reviews the medication, class of the drug, and side effects. Includes the steps to properly store meds and maintain the prescription regimen.  Written material given at graduation. Flowsheet Row Pulmonary Rehab from 08/20/2023 in [facility] Cardiac and Pulmonary Rehab  Education need identified 08/20/23       Other: -Provides group and verbal instruction on various topics (see comments)   Knowledge Questionnaire Score:  Knowledge Questionnaire Score - 08/20/23 0952       Knowledge Questionnaire Score   Pre Score 14/18              Core Components/Risk Factors/Patient Goals at Admission:  Personal Goals and Risk Factors at Admission - 08/17/23 1521       Core Components/Risk Factors/Patient Goals on Admission    Weight Management Yes;Weight Loss    Intervention Weight Management: Develop a combined nutrition and exercise program designed to reach desired caloric intake, while maintaining appropriate intake of nutrient and fiber, sodium and fats, and appropriate energy expenditure required for the weight goal.;Weight Management: Provide education and appropriate resources to help participant work on and attain dietary goals.;Weight Management/Obesity: Establish reasonable short term and long term weight goals.     Expected Outcomes Short Term: Continue to assess and modify interventions until short term weight is achieved;Weight Loss: Understanding of general recommendations for a balanced deficit meal plan, which promotes 1-2 lb weight loss per week and includes a negative energy balance of [phone] kcal/d;Understanding recommendations for meals to include 15-35% energy as protein, 25-35% energy from fat, 35-60% energy from carbohydrates, less than 200mg  of dietary cholesterol, 20-35 gm of total fiber daily;Understanding of distribution of calorie intake throughout the day with the consumption of 4-5 meals/snacks    Hypertension Yes    Intervention Provide education on lifestyle modifcations including regular physical activity/exercise, weight management, moderate sodium restriction and increased consumption of fresh fruit, vegetables,  and low fat dairy, alcohol  moderation, and smoking cessation.;Monitor prescription use compliance.    Expected Outcomes Short Term: Continued assessment and intervention until BP is < 140/60mm HG in hypertensive participants. < 130/81mm HG in hypertensive participants with diabetes, heart failure or chronic kidney disease.;Long Term: Maintenance of blood pressure at goal levels.             Education:Diabetes - Individual verbal and written instruction to review signs/symptoms of diabetes, desired ranges of glucose level fasting, after meals and with exercise. Acknowledge that pre and post exercise glucose checks will be done for 3 sessions at entry of program.   Know Your Numbers and Heart Failure: - Group verbal and visual instruction to discuss disease risk factors for cardiac and pulmonary disease and treatment options.  Reviews associated critical values for Overweight/Obesity, Hypertension, Cholesterol, and Diabetes.  Discusses basics of heart failure: signs/symptoms and treatments.  Introduces Heart Failure Zone chart for action plan for heart failure.  Written material  given at graduation.   Core Components/Risk Factors/Patient Goals Review:    Core Components/Risk Factors/Patient Goals at Discharge (Final Review):    ITP Comments:  ITP Comments     Row Name 08/17/23 1518 08/20/23 0906 08/25/23 0821 08/26/23 0824     ITP Comments Virtual Visit completed. Patient informed on EP and RD appointment and 6 Minute walk test. Patient also informed of patient health questionnaires on My Chart. Patient Verbalizes understanding. Visit diagnosis can be found in [facility] 06/11/2023. Completed and gym orientation for respiratory care services. Initial ITP created and sent for review to Dr. Faud Aleskerov, Medical Director. First full day of exercise!  Patient was oriented to gym and equipment including functions, settings, policies, and procedures.  Patient's individual exercise prescription and treatment plan were reviewed.  All starting workloads were established based on the results of the 6 minute walk test done at initial orientation visit.  The plan for exercise progression was also introduced and progression will be customized based on patient's performance and goals. 30 Day review completed. Medical Director ITP review done, changes made as directed, and signed approval by Medical Director.    new to program             Comments:

## 2023-08-27 ENCOUNTER — Encounter: Admitting: *Deleted

## 2023-08-27 DIAGNOSIS — J439 Emphysema, unspecified: Secondary | ICD-10-CM | POA: Diagnosis not present

## 2023-08-27 NOTE — Progress Notes (Signed)
 Daily Session Note  Patient Details  Name: [name] MRN: [id number] Date of Birth: 1960/11/11 Referring Provider:   Flowsheet Row Pulmonary Rehab from 08/20/2023 in [facility] Cardiac and Pulmonary Rehab  Referring Provider Dr. Cam Cava, MD       Encounter Date: 08/27/2023  Check In:  Session Check In - 08/27/23 0746       Check-In   Supervising physician immediately available to respond to emergencies See telemetry face sheet for immediately available ER MD    Location [facility]-Cardiac & Pulmonary Rehab    Staff Present Maud Sorenson, RN, BSN, CCRP;Joseph Hood RCP,RRT,BSRT;Noah Tickle, [place], Exercise Physiologist;Jason Martina Sledge RDN,LDN    Virtual Visit No    Medication changes reported     No    Fall or balance concerns reported    No    Warm-up and Cool-down Performed on first and last piece of equipment    Resistance Training Performed Yes    VAD Patient? No    PAD/SET Patient? No      Pain Assessment   Currently in Pain? No/denies                Social History   Tobacco Use  Smoking Status Former   Current packs/day: 0.00   Average packs/day: 1 pack/day for 30.0 years (30.0 ttl pk-yrs)   Types: Cigarettes   Start date: 03/27/1986   Quit date: 03/27/2016   Years since quitting: 7.4  Smokeless Tobacco Former   Quit date: 05/18/2011    Goals Met:  Proper associated with RPD/PD & O2 Sat Independence with exercise equipment Exercise tolerated well No report of concerns or symptoms today  Goals Unmet:  Not Applicable  Comments: Pt able to follow exercise prescription today without complaint.  Will continue to monitor for progression.    Dr. Firman Hughes is Medical Director for [facility] Cardiac Rehabilitation.  Dr. Fuad Aleskerov is Medical Director for [facility] Pulmonary Rehabilitation.

## 2023-09-01 ENCOUNTER — Encounter: Admitting: *Deleted

## 2023-09-01 DIAGNOSIS — J439 Emphysema, unspecified: Secondary | ICD-10-CM | POA: Diagnosis not present

## 2023-09-01 NOTE — Progress Notes (Signed)
 Daily Session Note  Patient Details  Name: MAZIAH MORTIMORE MRN: [id number] Date of Birth: [birth year] Referring Provider:   Flowsheet Row Pulmonary Rehab from 08/20/2023 in [facility] Cardiac and Pulmonary Rehab  Referring Provider Dr. Cam Cava, MD       Encounter Date: 09/01/2023  Check In:  Session Check In - 09/01/23 0744       Check-In   Supervising physician immediately available to respond to emergencies See telemetry face sheet for immediately available ER MD    Location [facility]-Cardiac & Pulmonary Rehab    Staff Present Maud Sorenson, RN, BSN, CCRP;Margaret Best, MS, Exercise Physiologist;Jason Martina Sledge RDN,LDN    Virtual Visit No    Medication changes reported     No    Fall or balance concerns reported    No    Warm-up and Cool-down Performed on first and last piece of equipment    Resistance Training Performed Yes    VAD Patient? No    PAD/SET Patient? No      Pain Assessment   Currently in Pain? No/denies                Social History   Tobacco Use  Smoking Status Former   Current packs/day: 0.00   Average packs/day: 1 pack/day for 30.0 years (30.0 ttl pk-yrs)   Types: Cigarettes   Start date: 03/27/1986   Quit date: 03/27/2016   Years since quitting: 7.4  Smokeless Tobacco Former   Quit date: 05/18/2011    Goals Met:  Proper associated with RPD/PD & O2 Sat Independence with exercise equipment Exercise tolerated well No report of concerns or symptoms today  Goals Unmet:  Not Applicable  Comments: Pt able to follow exercise prescription today without complaint.  Will continue to monitor for progression.    Dr. Firman Hughes is Medical Director for [facility] Cardiac Rehabilitation.  Dr. Fuad Aleskerov is Medical Director for [facility] Pulmonary Rehabilitation.

## 2023-09-03 ENCOUNTER — Encounter

## 2023-09-03 ENCOUNTER — Encounter: Admitting: *Deleted

## 2023-09-03 ENCOUNTER — Encounter: Payer: Self-pay | Admitting: Medical

## 2023-09-03 DIAGNOSIS — J439 Emphysema, unspecified: Secondary | ICD-10-CM

## 2023-09-03 NOTE — Progress Notes (Signed)
 Daily Session Note  Patient Details  Name: [name] MRN: [id number] Date of Birth: 1960-08-23 Referring Provider:   Flowsheet Row Pulmonary Rehab from 08/20/2023 in [facility] Cardiac and Pulmonary Rehab  Referring Provider Dr. Cam Cava, MD       Encounter Date: 09/03/2023  Check In:  Session Check In - 09/03/23 0743       Check-In   Supervising physician immediately available to respond to emergencies See telemetry face sheet for immediately available ER MD    Location [facility]-Cardiac & Pulmonary Rehab    Staff Present Maud Sorenson, RN, BSN, CCRP;Joseph Hood RCP,RRT,BSRT;Margaret Best, MS, Exercise Physiologist    Virtual Visit No    Medication changes reported     No    Fall or balance concerns reported    No    Warm-up and Cool-down Performed on first and last piece of equipment    Resistance Training Performed Yes    VAD Patient? No    PAD/SET Patient? No      Pain Assessment   Currently in Pain? No/denies                Social History   Tobacco Use  Smoking Status Former   Current packs/day: 0.00   Average packs/day: 1 pack/day for 30.0 years (30.0 ttl pk-yrs)   Types: Cigarettes   Start date: 03/27/1986   Quit date: 03/27/2016   Years since quitting: 7.4  Smokeless Tobacco Former   Quit date: 05/18/2011    Goals Met:  Proper associated with RPD/PD & O2 Sat Independence with exercise equipment Exercise tolerated well No report of concerns or symptoms today  Goals Unmet:  Not Applicable  Comments: Pt able to follow exercise prescription today without complaint.  Will continue to monitor for progression.    Dr. Firman Hughes is Medical Director for [facility] Cardiac Rehabilitation.  Dr. Fuad Aleskerov is Medical Director for [facility] Pulmonary Rehabilitation.

## 2023-09-04 ENCOUNTER — Encounter: Payer: Self-pay | Admitting: Nurse Practitioner

## 2023-09-04 ENCOUNTER — Ambulatory Visit: Admitting: Nurse Practitioner

## 2023-09-08 ENCOUNTER — Encounter: Admitting: *Deleted

## 2023-09-08 DIAGNOSIS — J439 Emphysema, unspecified: Secondary | ICD-10-CM

## 2023-09-08 NOTE — Progress Notes (Signed)
 Daily Session Note  Patient Details  Name: [name] MRN: [id number] Date of Birth: [birth year] Referring Provider:   Flowsheet Row Pulmonary Rehab from 08/20/2023 in [facility] Cardiac and Pulmonary Rehab  Referring Provider Dr. Cam Cava, MD       Encounter Date: 09/08/2023  Check In:  Session Check In - 09/08/23 0747       Check-In   Supervising physician immediately available to respond to emergencies See telemetry face sheet for immediately available ER MD    Location [facility]-Cardiac & Pulmonary Rehab    Staff Present Maud Sorenson, RN, BSN, CCRP;Margaret Best, MS, Exercise Physiologist;Noah Tickle, BS, Exercise Physiologist;Jason Martina Sledge RDN,LDN    Virtual Visit No    Medication changes reported     No    Fall or balance concerns reported    No    Warm-up and Cool-down Performed on first and last piece of equipment    Resistance Training Performed Yes    VAD Patient? No    PAD/SET Patient? No      Pain Assessment   Currently in Pain? No/denies                Social History   Tobacco Use  Smoking Status Former   Current packs/day: 0.00   Average packs/day: 1 pack/day for 30.0 years (30.0 ttl pk-yrs)   Types: Cigarettes   Start date: 03/27/1986   Quit date: 03/27/2016   Years since quitting: 7.4  Smokeless Tobacco Former   Quit date: 05/18/2011    Goals Met:  Proper associated with RPD/PD & O2 Sat Independence with exercise equipment Exercise tolerated well No report of concerns or symptoms today  Goals Unmet:  Not Applicable  Comments: Pt able to follow exercise prescription today without complaint.  Will continue to monitor for progression.    Dr. Firman Hughes is Medical Director for [facility] Cardiac Rehabilitation.  Dr. Fuad Aleskerov is Medical Director for [facility] Pulmonary Rehabilitation.

## 2023-09-10 ENCOUNTER — Encounter

## 2023-09-15 ENCOUNTER — Encounter

## 2023-09-17 ENCOUNTER — Encounter: Admitting: *Deleted

## 2023-09-17 DIAGNOSIS — J439 Emphysema, unspecified: Secondary | ICD-10-CM

## 2023-09-17 NOTE — Progress Notes (Signed)
 Daily Session Note  Patient Details  Name: [name] MRN: [id number] Date of Birth: 06-Apr-1961 Referring Provider:   Flowsheet Row Pulmonary Rehab from 08/20/2023 in [facility] Cardiac and Pulmonary Rehab  Referring Provider Dr. Cam Cava, MD       Encounter Date: 09/17/2023  Check In:  Session Check In - 09/17/23 0744       Check-In   Supervising physician immediately available to respond to emergencies See telemetry face sheet for immediately available ER MD    Location [facility]-Cardiac & Pulmonary Rehab    Staff Present Maud Sorenson, RN, BSN, CCRP;Margaret Best, MS, Exercise Physiologist;Joseph Lacinda Pica RCP,RRT,BSRT    Virtual Visit No    Medication changes reported     No    Fall or balance concerns reported    No    Warm-up and Cool-down Performed on first and last piece of equipment    Resistance Training Performed Yes    VAD Patient? No    PAD/SET Patient? No      Pain Assessment   Currently in Pain? No/denies                Social History   Tobacco Use  Smoking Status Former   Current packs/day: 0.00   Average packs/day: 1 pack/day for 30.0 years (30.0 ttl pk-yrs)   Types: Cigarettes   Start date: 03/27/1986   Quit date: 03/27/2016   Years since quitting: 7.4  Smokeless Tobacco Former   Quit date: 05/18/2011    Goals Met:  Proper associated with RPD/PD & O2 Sat Independence with exercise equipment Exercise tolerated well No report of concerns or symptoms today  Goals Unmet:  Not Applicable  Comments: Pt able to follow exercise prescription today without complaint.  Will continue to monitor for progression.    Dr. Firman Hughes is Medical Director for [facility] Cardiac Rehabilitation.  Dr. Fuad Aleskerov is Medical Director for [facility] Pulmonary Rehabilitation.

## 2023-09-22 ENCOUNTER — Encounter: Attending: Internal Medicine

## 2023-09-22 DIAGNOSIS — J432 Centrilobular emphysema: Secondary | ICD-10-CM | POA: Diagnosis present

## 2023-09-22 DIAGNOSIS — J439 Emphysema, unspecified: Secondary | ICD-10-CM | POA: Diagnosis present

## 2023-09-22 NOTE — Progress Notes (Signed)
 Daily Session Note  Patient Details  Name: [name] MRN: [id number] Date of Birth: [birth year] Referring Provider:   Flowsheet Row Pulmonary Rehab from 08/20/2023 in [facility] Cardiac and Pulmonary Rehab  Referring Provider Dr. Cam Cava, MD       Encounter Date: 09/22/2023  Check In:  Session Check In - 09/22/23 0738       Check-In   Supervising physician immediately available to respond to emergencies See telemetry face sheet for immediately available ER MD    Location [facility]-Cardiac & Pulmonary Rehab    Staff Present Sherle Dire, BS, Exercise Physiologist;Jason Martina Sledge RDN,LDN;Susanne Bice, RN, BSN, CCRP;Ibrahem Volkman RN,BSN,MPA    Virtual Visit No    Medication changes reported     No    Fall or balance concerns reported    No    Warm-up and Cool-down Performed on first and last piece of equipment    Resistance Training Performed Yes    VAD Patient? No    PAD/SET Patient? No      Pain Assessment   Currently in Pain? No/denies                Social History   Tobacco Use  Smoking Status Former   Current packs/day: 0.00   Average packs/day: 1 pack/day for 30.0 years (30.0 ttl pk-yrs)   Types: Cigarettes   Start date: 03/27/1986   Quit date: 03/27/2016   Years since quitting: 7.4  Smokeless Tobacco Former   Quit date: 05/18/2011    Goals Met:  Independence with exercise equipment Exercise tolerated well No report of concerns or symptoms today Strength training completed today  Goals Unmet:  Not Applicable  Comments: Pt able to follow exercise prescription today without complaint.  Will continue to monitor for progression.    Dr. Firman Hughes is Medical Director for [facility] Cardiac Rehabilitation.  Dr. Fuad Aleskerov is Medical Director for [facility] Pulmonary Rehabilitation.

## 2023-09-23 ENCOUNTER — Encounter: Payer: Self-pay | Admitting: *Deleted

## 2023-09-23 DIAGNOSIS — J439 Emphysema, unspecified: Secondary | ICD-10-CM

## 2023-09-23 NOTE — Progress Notes (Signed)
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 educate and counsel regarding individualized specific dietary modifications aiming towards targeted core components such as weight, hypertension, lipid management, diabetes, heart failure and other comorbidities.    Expected Outcomes Short Term Goal: Understand basic principles of dietary content, such as calories, fat, sodium, cholesterol and nutrients.;Short Term Goal: A plan has been developed with personal nutrition goals set during dietitian appointment.;Long Term Goal: Adherence to prescribed nutrition plan.             Nutrition Assessments:  MEDIFICTS Score Key: >=70 Need to make dietary changes  40-70 Heart Healthy Diet <= 40 Therapeutic Level Cholesterol Diet  Flowsheet Row Pulmonary Rehab from 08/20/2023 in [facility] Cardiac and Pulmonary Rehab  Picture Your Plate  Total Score on Admission 69      Picture Your Plate Scores: <91 Unhealthy dietary pattern with much room for improvement. 41-50 Dietary pattern unlikely to meet recommendations for good health and room for improvement. 51-60 More healthful dietary pattern, with some room for improvement.  >60 Healthy dietary pattern, although there may be some specific behaviors that could be improved.   Nutrition Goals Re-Evaluation:  Nutrition Goals Re-Evaluation     Row Name 09/17/23 0747             Goals   Comment Patient was informed on why it is important to maintain a balanced diet when dealing with Respiratory issues. Explained that it takes a lot of energy to breath and when they are short of breath often they will need to have a good diet to help keep up with the calories they are expending for breathing.       Expected Outcome Short: Choose and plan snacks accordingly to patients caloric intake to improve breathing. Long: Maintain a diet independently that meets their caloric intake to aid in daily shortness of breath.                Nutrition Goals Discharge (Final Nutrition Goals Re-Evaluation):  Nutrition Goals Re-Evaluation - 09/17/23 0747       Goals   Comment Patient was informed on why it is important to maintain a balanced diet when dealing with Respiratory issues. Explained that it takes a lot of energy to breath and when they are short of breath often they will need to have a good diet to help keep up with the calories they are expending for breathing.    Expected Outcome Short: Choose and plan snacks accordingly to patients caloric intake to improve breathing. Long: Maintain a diet independently that meets their caloric intake to aid in daily shortness of breath.             Psychosocial: Target Goals: Acknowledge presence or absence of significant depression and/or stress, maximize coping skills, provide positive support system. Participant is able to verbalize  types and ability to use techniques and skills needed for reducing stress and depression.   Education: Stress, Anxiety, and Depression - Group verbal and visual  presentation to define topics covered.  Reviews how body is impacted by stress, anxiety, and depression.  Also discusses healthy ways to reduce stress and to treat/manage anxiety and depression.  Written material given at graduation. Flowsheet Row Pulmonary Rehab from 09/03/2023 in [facility] Cardiac and Pulmonary Rehab  Date 08/27/23  Educator SB  Instruction Review Code 1- [employer] Understanding       Education: Sleep Hygiene -Provides group verbal and written instruction about how sleep can affect your health.  Define sleep hygiene, discuss sleep cycles and impact of sleep habits. Review good sleep hygiene tips.    Initial Review & Psychosocial Screening:  Initial Psych Review & Screening - 08/17/23 1522       Initial Review   Current issues with Current Psychotropic Meds;History of Depression;Current Depression      Family Dynamics   Good Support System? Yes    Comments She 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 She can look to her son for support that lives with her. Her family is close by and she can rely on them.      Barriers   Psychosocial barriers to participate in program The patient should benefit from training in stress management and relaxation.      Screening Interventions   Interventions Encouraged to exercise;To provide support and resources with identified psychosocial needs;Provide feedback about the scores to participant    Expected Outcomes Short Term goal: Utilizing psychosocial counselor, staff and physician to assist with identification of specific Stressors or current issues interfering with healing process. Setting desired goal for each stressor or current issue identified.;Long Term Goal: Stressors or current issues are  controlled or eliminated.;Short Term goal: Identification and review with participant of any Quality of Life or Depression concerns found by scoring the questionnaire.;Long Term goal: The participant improves quality of Life and PHQ9 Scores as seen by post scores and/or verbalization of changes             Quality of Life Scores:  Scores of 19 and below usually indicate a poorer quality of life in these areas.  A difference of  2-3 points is a clinically meaningful difference.  A difference of 2-3 points in the total score of the Quality of Life Index has been associated with significant improvement in overall quality of life, self-image, physical symptoms, and general health in studies assessing change in quality of life.  PHQ-9: Review Flowsheet  More data exists      09/17/2023 08/20/2023 05/19/2023 04/02/2023 12/26/2022  Depression screen PHQ 2/9  Decreased Interest 1 1 0 0 0  Down, Depressed, Hopeless 1 1 0 0 1  PHQ - 2 Score 2 2 0 0 1  Altered sleeping 3 3 2 3 2   Tired, decreased energy 3 3 0 2 3  Change in appetite 2 2 0 0 1  Feeling bad or failure about yourself  0 0 0 0 0  Trouble concentrating 0 1 0 0 0  Moving slowly or fidgety/restless 0 2 0 0 0  Suicidal thoughts 0 0 0 0 0  PHQ-9 Score 10 13 2 5 7   Difficult doing work/chores Not difficult at all Somewhat difficult Not difficult at all Somewhat difficult Somewhat difficult   Interpretation of Total Score  Total Score Depression Severity:  1-4 = Minimal depression, 5-9 = Mild depression, 10-14 = Moderate depression, 15-19 = Moderately severe depression, 20-27 = Severe depression  Psychosocial Evaluation and Intervention:  Psychosocial Evaluation - 08/17/23 1525       Psychosocial Evaluation & Interventions   Interventions Encouraged to exercise with the program and follow exercise prescription;Relaxation education;Stress management education    Comments She had a heart attack in the past and is having issues with her  shortness of breath which makes her depressed at times. She can look to her son for support that lives with her. Her family is close by and she can rely on them.    Expected Outcomes Short: Start [facility] to help with mood. Long: Maintain a healthy mental state    Continue Psychosocial Services  Follow up required by staff             Psychosocial Re-Evaluation:  Psychosocial Re-Evaluation     Row Name 09/17/23 [phone]             Psychosocial Re-Evaluation   Current issues with Current Anxiety/Panic;Current Depression;History of Depression;Current Psychotropic Meds       Comments Reviewed patient health questionnaire (PHQ-9) with patient for follow up. Previously, patients score indicated signs/symptoms of depression.  Reviewed to see if patient is improving symptom wise while in program.  Score improved and patient states that it is because she is working towards better health.       Expected Outcomes Short: Continue to attend [facility] regularly for regular exercise and social engagement. Long: Continue to improve symptoms and manage a positive mental state.       Interventions Encouraged to attend Pulmonary Rehabilitation for the exercise       Continue Psychosocial Services  Follow up required by staff                Psychosocial Discharge (Final Psychosocial Re-Evaluation):  Psychosocial Re-Evaluation - 09/17/23 9604       Psychosocial Re-Evaluation   Current issues with Current Anxiety/Panic;Current Depression;History of Depression;Current Psychotropic Meds    Comments Reviewed patient health questionnaire (PHQ-9) with patient for follow up. Previously, patients score indicated signs/symptoms of depression.  Reviewed to see if patient is improving symptom wise while in program.  Score improved and patient states that it is because she is working towards better health.    Expected Outcomes Short: Continue to attend [facility] regularly for regular exercise and social  engagement. Long: Continue to improve symptoms and manage a positive mental state.    Interventions Encouraged to attend Pulmonary Rehabilitation for the exercise    Continue Psychosocial Services  Follow up required by staff             Education: Education Goals: Education classes will be provided on a weekly basis, covering required topics. Participant will state understanding/return demonstration of topics presented.  Learning Barriers/Preferences:  Learning Barriers/Preferences - 08/17/23 1521       Learning Barriers/Preferences   Learning Barriers None    Learning Preferences None             General Pulmonary Education Topics:  Infection Prevention: - Provides verbal and written material to individual with discussion of infection control including proper hand washing and proper equipment cleaning during exercise session. Flowsheet Row Pulmonary Rehab from 09/03/2023 in [facility] Cardiac and Pulmonary Rehab  Date 08/17/23  Educator jh  Instruction Review Code 1- Verbalizes Understanding       Falls Prevention: - Provides verbal and written material to individual with discussion of falls prevention and safety. Flowsheet Row Pulmonary Rehab from 09/03/2023 in [facility] Cardiac and Pulmonary Rehab  Date 08/17/23  Educator jh  Instruction Review Code 1- Verbalizes Understanding       Chronic Lung Disease Review: - Group verbal instruction with posters, models, PowerPoint presentations and videos,  to review new updates, new respiratory medications, new advancements in procedures and treatments. Providing information on websites and "800" numbers for continued self-education. Includes information about supplement oxygen, available portable oxygen systems, continuous and intermittent flow rates, oxygen safety, concentrators, and Medicare reimbursement for oxygen. Explanation of Pulmonary Drugs, including class, frequency, complications, importance of spacers, rinsing mouth after  steroid MDI's, and proper cleaning methods for nebulizers. Review of basic lung anatomy and physiology related to function, structure, and complications of lung disease. Review of risk factors. Discussion about methods for diagnosing sleep apnea and types of masks and machines for OSA. Includes a review of the use of types of environmental controls: home humidity, furnaces, filters, dust mite/pet prevention, HEPA vacuums. Discussion about weather changes, air quality and the benefits of nasal washing. Instruction on Warning signs, infection symptoms, calling MD promptly, preventive modes, and value of vaccinations. Review of effective airway clearance, coughing and/or vibration techniques. Emphasizing that all should Create an Action Plan. Written material given at graduation. Flowsheet Row Pulmonary Rehab from 09/03/2023 in [facility] Cardiac and Pulmonary Rehab  Education need identified 08/20/23       AED/CPR: - Group verbal and written instruction with the use of models to demonstrate the basic use of the AED with the basic ABC's of resuscitation.    Anatomy and Cardiac Procedures: - Group verbal and visual presentation and models provide information about basic cardiac anatomy and function. Reviews the testing methods done to diagnose heart disease and the outcomes of the test results. Describes the treatment choices: Medical Management, Angioplasty, or Coronary Bypass Surgery for treating various heart conditions including Myocardial Infarction, Angina, Valve Disease, and Cardiac Arrhythmias.  Written material given at graduation.   Medication Safety: - Group verbal and visual instruction to review commonly prescribed medications for heart and lung disease. Reviews the medication, class of the drug, and side effects. Includes the steps to properly store meds and maintain the prescription regimen.  Written material given at graduation. Flowsheet Row Pulmonary Rehab from 09/03/2023 in [facility] Cardiac and  Pulmonary Rehab  Education need identified 08/20/23       Other: -Provides group and verbal instruction on various topics (see comments)   Knowledge Questionnaire Score:  Knowledge Questionnaire Score - 08/20/23 0952       Knowledge Questionnaire Score   Pre Score 14/18              Core Components/Risk Factors/Patient Goals at Admission:  Personal Goals and Risk Factors at Admission - 08/17/23 1521       Core Components/Risk Factors/Patient Goals on Admission    Weight Management Yes;Weight Loss    Intervention Weight Management: Develop a combined nutrition and exercise program designed to reach desired caloric intake, while maintaining appropriate intake of nutrient and fiber, sodium and fats, and appropriate energy expenditure required for the weight goal.;Weight Management: Provide education and appropriate resources to help participant work on and attain dietary goals.;Weight Management/Obesity: Establish reasonable short term and long term weight goals.    Expected Outcomes Short Term: Continue to assess and modify interventions until short term weight is achieved;Weight Loss: Understanding of general recommendations for a balanced deficit meal plan, which promotes 1-2 lb weight loss per week and includes a negative energy balance of [phone] kcal/d;Understanding recommendations for meals to include 15-35% energy as protein,  25-35% energy from fat, 35-60% energy from carbohydrates, less than 200mg  of dietary cholesterol, 20-35 gm of total fiber daily;Understanding of distribution of calorie intake throughout the day with the consumption of 4-5 meals/snacks    Hypertension Yes    Intervention Provide education on lifestyle modifcations including regular physical activity/exercise, weight management, moderate sodium restriction and increased consumption of fresh fruit, vegetables, and low fat dairy, alcohol  moderation, and smoking cessation.;Monitor prescription use compliance.     Expected Outcomes Short Term: Continued assessment and intervention until BP is < 140/72mm HG in hypertensive participants. < 130/86mm HG in hypertensive participants with diabetes, heart failure or chronic kidney disease.;Long Term: Maintenance of blood pressure at goal levels.             Education:Diabetes - Individual verbal and written instruction to review signs/symptoms of diabetes, desired ranges of glucose level fasting, after meals and with exercise. Acknowledge that pre and post exercise glucose checks will be done for 3 sessions at entry of program.   Know Your Numbers and Heart Failure: - Group verbal and visual instruction to discuss disease risk factors for cardiac and pulmonary disease and treatment options.  Reviews associated critical values for Overweight/Obesity, Hypertension, Cholesterol, and Diabetes.  Discusses basics of heart failure: signs/symptoms and treatments.  Introduces Heart Failure Zone chart for action plan for heart failure.  Written material given at graduation.   Core Components/Risk Factors/Patient Goals Review:   Goals and Risk Factor Review     Row Name 09/17/23 0746             Core Components/Risk Factors/Patient Goals Review   Personal Goals Review Improve shortness of breath with ADL's       Review Spoke to patient about their shortness of breath and what they can do to improve. Patient has been informed of breathing techniques when starting the program. Patient is informed to tell staff if they have had any med changes and that certain meds they are taking or not taking can be causing shortness of breath.       Expected Outcomes Short: Attend [facility] regularly to improve shortness of breath with ADL's. Long: maintain independence with ADL's                Core Components/Risk Factors/Patient Goals at Discharge (Final Review):   Goals and Risk Factor Review - 09/17/23 0746       Core Components/Risk Factors/Patient Goals Review    Personal Goals Review Improve shortness of breath with ADL's    Review Spoke to patient about their shortness of breath and what they can do to improve. Patient has been informed of breathing techniques when starting the program. Patient is informed to tell staff if they have had any med changes and that certain meds they are taking or not taking can be causing shortness of breath.    Expected Outcomes Short: Attend [facility] regularly to improve shortness of breath with ADL's. Long: maintain independence with ADL's             ITP Comments:  ITP Comments     Row Name 08/17/23 1518 08/20/23 0906 08/25/23 0821 08/26/23 0824 09/23/23 0912   ITP Comments Virtual Visit completed. Patient informed on EP and RD appointment and 6 Minute walk test. Patient also informed of patient health questionnaires on My Chart. Patient Verbalizes understanding. Visit diagnosis can be found in [facility] 06/11/2023. Completed and gym orientation for respiratory care services. Initial ITP created and sent for review to Dr.  Faud Aleskerov, Medical Director. First full day of exercise!  Patient was oriented to gym and equipment including functions, settings, policies, and procedures.  Patient's individual exercise prescription and treatment plan were reviewed.  All starting workloads were established based on the results of the 6 minute walk test done at initial orientation visit.  The plan for exercise progression was also introduced and progression will be customized based on patient's performance and goals. 30 Day review completed. Medical Director ITP review done, changes made as directed, and signed approval by Medical Director.    new to program 30 Day review completed. Medical Director ITP review done, changes made as directed, and signed approval by Medical Director.            Comments:

## 2023-09-24 ENCOUNTER — Encounter

## 2023-09-25 ENCOUNTER — Ambulatory Visit

## 2023-09-29 ENCOUNTER — Encounter: Payer: Self-pay | Admitting: Nurse Practitioner

## 2023-09-29 ENCOUNTER — Ambulatory Visit: Admitting: Nurse Practitioner

## 2023-09-29 ENCOUNTER — Encounter: Admitting: *Deleted

## 2023-09-29 ENCOUNTER — Telehealth: Payer: Self-pay

## 2023-09-29 VITALS — BP 120/86 | HR 84 | Temp 97.2°F | Resp 16 | Ht 63.0 in | Wt 171.4 lb

## 2023-09-29 DIAGNOSIS — J439 Emphysema, unspecified: Secondary | ICD-10-CM | POA: Diagnosis not present

## 2023-09-29 DIAGNOSIS — R0602 Shortness of breath: Secondary | ICD-10-CM | POA: Diagnosis not present

## 2023-09-29 DIAGNOSIS — G4733 Obstructive sleep apnea (adult) (pediatric): Secondary | ICD-10-CM

## 2023-09-29 DIAGNOSIS — J432 Centrilobular emphysema: Secondary | ICD-10-CM | POA: Diagnosis not present

## 2023-09-29 MED ORDER — ROFLUMILAST 500 MCG PO TABS: 500.0000 ug | ORAL_TABLET | Freq: Every day | ORAL | 5 refills | Status: DC

## 2023-09-29 MED ORDER — ROFLUMILAST 250 MCG PO TABS: 250.0000 ug | ORAL_TABLET | Freq: Every day | ORAL | 0 refills | Status: DC

## 2023-09-29 NOTE — Progress Notes (Signed)
 Daily Session Note  Patient Details  Name: [name] MRN: [id number] Date of Birth: [birth year] Referring Provider:   Flowsheet Row Pulmonary Rehab from 08/20/2023 in [facility] Cardiac and Pulmonary Rehab  Referring Provider Dr. Cam Cava, MD       Encounter Date: 09/29/2023  Check In:  Session Check In - 09/29/23 0805       Check-In   Supervising physician immediately available to respond to emergencies See telemetry face sheet for immediately available ER MD    Location [facility]-Cardiac & Pulmonary Rehab    Staff Present Maud Sorenson, RN, BSN, CCRP;Noah Tickle, BS, Exercise Physiologist;Kelly Bollinger [facility]    Virtual Visit No    Medication changes reported     No    Fall or balance concerns reported    No    Warm-up and Cool-down Performed on first and last piece of equipment    Resistance Training Performed Yes    VAD Patient? No    PAD/SET Patient? No      Pain Assessment   Currently in Pain? No/denies                Social History   Tobacco Use  Smoking Status Former   Current packs/day: 0.00   Average packs/day: 1 pack/day for 30.0 years (30.0 ttl pk-yrs)   Types: Cigarettes   Start date: 03/27/1986   Quit date: 03/27/2016   Years since quitting: 7.5  Smokeless Tobacco Former   Quit date: 05/18/2011    Goals Met:  Proper associated with RPD/PD & O2 Sat Independence with exercise equipment Exercise tolerated well No report of concerns or symptoms today  Goals Unmet:  Not Applicable  Comments: Pt able to follow exercise prescription today without complaint.  Will continue to monitor for progression.    Dr. Firman Hughes is Medical Director for [facility] Cardiac Rehabilitation.  Dr. Fuad Aleskerov is Medical Director for [facility] Pulmonary Rehabilitation.

## 2023-09-29 NOTE — Telephone Encounter (Signed)
 Sent message to [facility] to sarah that we gave nebulizer to pt at office and order is in epic note will done soon
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
## 2023-10-01 ENCOUNTER — Telehealth: Payer: Self-pay

## 2023-10-01 ENCOUNTER — Encounter: Admitting: *Deleted

## 2023-10-01 DIAGNOSIS — J439 Emphysema, unspecified: Secondary | ICD-10-CM | POA: Diagnosis not present

## 2023-10-01 NOTE — Progress Notes (Signed)
 Daily Session Note  Patient Details  Name: [name] MRN: [id number] Date of Birth: May 20, 1960 Referring Provider:   Flowsheet Row Pulmonary Rehab from 08/20/2023 in [facility] Cardiac and Pulmonary Rehab  Referring Provider Dr. Cam Cava, MD    Encounter Date: 10/01/2023  Check In:  Session Check In - 10/01/23 0747       Check-In   Supervising physician immediately available to respond to emergencies See telemetry face sheet for immediately available ER MD    Location [facility]-Cardiac & Pulmonary Rehab    Staff Present Maud Sorenson, RN, BSN, CCRP;Noah Tickle, BS, Exercise Physiologist;Margaret Best, MS, Exercise Physiologist;Jason Martina Sledge RDN,LDN    Virtual Visit No    Medication changes reported     No    Fall or balance concerns reported    No    Warm-up and Cool-down Performed on first and last piece of equipment    Resistance Training Performed Yes    VAD Patient? No    PAD/SET Patient? No      Pain Assessment   Currently in Pain? No/denies             Social History   Tobacco Use  Smoking Status Former   Current packs/day: 0.00   Average packs/day: 1 pack/day for 30.0 years (30.0 ttl pk-yrs)   Types: Cigarettes   Start date: 03/27/1986   Quit date: 03/27/2016   Years since quitting: 7.5  Smokeless Tobacco Former   Quit date: 05/18/2011    Goals Met:  Proper associated with RPD/PD & O2 Sat Independence with exercise equipment Exercise tolerated well No report of concerns or symptoms today  Goals Unmet:  Not Applicable  Comments: Pt able to follow exercise prescription today without complaint.  Will continue to monitor for progression.    Dr. Firman Hughes is Medical Director for [facility] Cardiac Rehabilitation.  Dr. Fuad Aleskerov is Medical Director for [facility] Pulmonary Rehabilitation.

## 2023-10-01 NOTE — Telephone Encounter (Signed)
 Lmom due to pt lmom about her medication alyssa already sent med

## 2023-10-06 ENCOUNTER — Other Ambulatory Visit: Payer: Self-pay | Admitting: Nurse Practitioner

## 2023-10-06 ENCOUNTER — Encounter: Admitting: *Deleted

## 2023-10-06 DIAGNOSIS — J439 Emphysema, unspecified: Secondary | ICD-10-CM

## 2023-10-06 NOTE — Progress Notes (Signed)
 Daily Session Note  Patient Details  Name: [name] MRN: [id number] Date of Birth: [birth year] Referring Provider:   Flowsheet Row Pulmonary Rehab from 08/20/2023 in [facility] Cardiac and Pulmonary Rehab  Referring Provider Dr. Cam Cava, MD    Encounter Date: 10/06/2023  Check In:  Session Check In - 10/06/23 0753       Check-In   Supervising physician immediately available to respond to emergencies See telemetry face sheet for immediately available ER MD    Location [facility]-Cardiac & Pulmonary Rehab    Staff Present Maud Sorenson, RN, BSN, CCRP;Margaret Best, MS, Exercise Physiologist;Noah Tickle, BS, Exercise Physiologist;Jason Martina Sledge RDN,LDN    Virtual Visit No    Medication changes reported     No    Fall or balance concerns reported    No    Warm-up and Cool-down Performed on first and last piece of equipment    Resistance Training Performed Yes    VAD Patient? No    PAD/SET Patient? No      Pain Assessment   Currently in Pain? No/denies             Social History   Tobacco Use  Smoking Status Former   Current packs/day: 0.00   Average packs/day: 1 pack/day for 30.0 years (30.0 ttl pk-yrs)   Types: Cigarettes   Start date: 03/27/1986   Quit date: 03/27/2016   Years since quitting: 7.5  Smokeless Tobacco Former   Quit date: 05/18/2011    Goals Met:  Proper associated with RPD/PD & O2 Sat Independence with exercise equipment Exercise tolerated well No report of concerns or symptoms today  Goals Unmet:  Not Applicable  Comments: Pt able to follow exercise prescription today without complaint.  Will continue to monitor for progression.    Dr. Firman Hughes is Medical Director for [facility] Cardiac Rehabilitation.  Dr. Fuad Aleskerov is Medical Director for [facility] Pulmonary Rehabilitation.

## 2023-10-08 ENCOUNTER — Encounter

## 2023-10-13 ENCOUNTER — Encounter

## 2023-10-15 ENCOUNTER — Encounter

## 2023-10-19 DIAGNOSIS — J432 Centrilobular emphysema: Secondary | ICD-10-CM | POA: Diagnosis not present

## 2023-10-19 DIAGNOSIS — J301 Allergic rhinitis due to pollen: Secondary | ICD-10-CM | POA: Diagnosis not present

## 2023-10-20 ENCOUNTER — Encounter

## 2023-10-21 ENCOUNTER — Encounter: Payer: Self-pay | Admitting: Nurse Practitioner

## 2023-10-21 ENCOUNTER — Encounter: Payer: Self-pay | Admitting: *Deleted

## 2023-10-21 DIAGNOSIS — J439 Emphysema, unspecified: Secondary | ICD-10-CM

## 2023-10-21 DIAGNOSIS — J432 Centrilobular emphysema: Secondary | ICD-10-CM

## 2023-10-21 NOTE — Progress Notes (Signed)
 Early Discharge Summary   [name] DOB: 04/14/1961   [name] is being discharged at this time due to lack of transportation and her son being sick. She completed 11 of 36 sessions.    6 Minute Walk     Row Name 08/20/23 0932         6 Minute Walk   Phase Initial     Distance 810 feet     Walk Time 6 minutes     # of Rest Breaks 0     MPH 1.53     METS 2.32     RPE 12     Perceived Dyspnea  2     VO2 Peak 8.12     Symptoms Yes (comment)     Comments lightheaded     Resting HR 88 bpm     Resting BP 116/64     Resting Oxygen Saturation  98 %     Exercise Oxygen Saturation  during 6 min walk 94 %     Max Ex. HR 105 bpm     Max Ex. BP 124/70     2 Minute Post BP 120/68       Interval HR   1 Minute HR 92     2 Minute HR 93     3 Minute HR 92     4 Minute HR 98     5 Minute HR 105     6 Minute HR 95     2 Minute Post HR 91     Interval Heart Rate? Yes       Interval Oxygen   Interval Oxygen? Yes     Baseline Oxygen Saturation % 98 %     1 Minute Oxygen Saturation % 97 %     1 Minute Liters of Oxygen 0 L  RA     2 Minute Oxygen Saturation % 94 %     2 Minute Liters of Oxygen 0 L     3 Minute Oxygen Saturation % 94 %     3 Minute Liters of Oxygen 0 L     4 Minute Oxygen Saturation % 95 %     4 Minute Liters of Oxygen 0 L     5 Minute Oxygen Saturation % 95 %     5 Minute Liters of Oxygen 0 L     6 Minute Oxygen Saturation % 96 %     6 Minute Liters of Oxygen 0 L     2 Minute Post Oxygen Saturation % 98 %     2 Minute Post Liters of Oxygen 0 L

## 2023-10-21 NOTE — Progress Notes (Signed)
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 3 on the T4 nustep. She was also able to increase to level 2 on the recumbent bike. We will continue to monitor her progress in the program. [name] is doing well in rehab. She has been able to increase her speed on the treadmill from 1. to 1.39mph. She was also able to maintain level 2 on both the T4 nustep, and recumbent bike. We will continue to monitor her progress in the program. Reviewed home exercise with pt today from 7:55am to 8:08am.  Pt plans to use her treadmill at home 3 additional days a week and add in 3 lb dumbbells at home for exercise. She also plans [facility]. Reviewed THR, pulse, RPE, sign and symptoms, pulse oximetery and when to call 911 or MD.  Also discussed weather considerations and indoor options.  Pt voiced understanding.   Expected Outcomes Short: Use RPE daily to regulate intensity. Long: Follow program prescription in THR. Short: Continue to follow exercise  prescription. Long: Continue exercise to improve strength and stamina. Short: Continue to follow exercise prescription. Long: Continue exercise to improve strength and stamina. Short: Continue to follow exercise prescription. Long: Continue exercise to improve strength and stamina. Short: Add 3 additional days of exercise at home. Long: Continue to exercise at home independently.    Row Name 10/14/23 0954             Exercise Goal Re-Evaluation   Exercise Goals Review Increase Physical Activity;Increase Strength and Stamina;Understanding of Exercise Prescription       Comments [name] is doing well in rehab. She has recently been able to increase her workload on the treadmill from 1.8 to 2. and no incline. She was also able to increase her level on the T4 nustep from level 2 to 4. We will continue to monitor her progress in the program.       Expected Outcomes Short: Continue to increase treadmill workload. Long: Continue exercise to improve strength and stamina.          Discharge Exercise Prescription (Final Exercise Prescription Changes):  Exercise Prescription Changes - 10/14/23 0900       Response to Exercise   Blood Pressure (Admit) 112/66    Blood Pressure (Exercise) 156/82    Blood Pressure (Exit) 102/62    Heart Rate (Admit) 81 bpm    Heart Rate (Exercise) 120 bpm    Heart Rate (Exit) 89 bpm    Oxygen Saturation (Admit) 97 %    Oxygen Saturation (Exercise) 95 %    Oxygen Saturation (Exit) 96 %    Rating of Perceived Exertion (Exercise) 15    Symptoms none    Duration Progress to 30 minutes of  aerobic without signs/symptoms of physical distress    Intensity THRR unchanged      Progression   Progression Continue to progress workloads to maintain intensity without signs/symptoms of physical distress.    Average METs 2.9      Resistance Training   Training Prescription Yes  Weight 3    Reps 10-15      Interval Training   Interval Training No      Treadmill   MPH  2.2    Grade 0    Minutes 15    METs 2.69      Recumbant Bike   Level 2.5    Watts 15    Minutes 15    METs 2.61      NuStep   Level 4    Minutes 15    METs 3.7      Home Exercise Plan   Plans to continue exercise at Home (comment)   Treadmill and 3 lb dumbbells at home. Might look into [facility] as well.   Frequency Add 3 additional days to program exercise sessions.    Initial Home Exercises Provided 10/06/23      Oxygen   Maintain Oxygen Saturation 88% or higher          Nutrition:  Target Goals: Understanding of nutrition guidelines, daily intake of sodium 1500mg , cholesterol 200mg , calories 30% from fat and 7% or less from saturated fats, daily to have 5 or more servings of fruits and vegetables.  Education: All About Nutrition: -Group instruction provided by verbal, written material, interactive activities, discussions, models, and posters to present general guidelines for heart healthy nutrition including fat, fiber, MyPlate, the role of sodium in heart healthy nutrition, utilization of the nutrition label, and utilization of this knowledge for meal planning. Follow up email sent as well. Written material given at graduation.   Biometrics:  Pre Biometrics - 08/20/23 0943       Pre Biometrics   Height 5' 4 (1.626 m)    Weight 170 lb 14.4 oz (77.5 kg)    Waist Circumference 39 inches    Hip Circumference 41 inches    Waist to Hip Ratio 0.95 %    BMI (Calculated) 29.32    Single Leg Stand 14.6 seconds           Nutrition Therapy Plan and Nutrition Goals:  Nutrition Therapy & Goals - 08/20/23 0952       Intervention Plan   Intervention Prescribe, educate and counsel regarding individualized specific dietary modifications aiming towards targeted core components such as weight, hypertension, lipid management, diabetes, heart failure and other comorbidities.    Expected Outcomes Short Term Goal: Understand basic principles of dietary content, such as  calories, fat, sodium, cholesterol and nutrients.;Short Term Goal: A plan has been developed with personal nutrition goals set during dietitian appointment.;Long Term Goal: Adherence to prescribed nutrition plan.          Nutrition Assessments:  MEDIFICTS Score Key: >=70 Need to make dietary changes  40-70 Heart Healthy Diet <= 40 Therapeutic Level Cholesterol Diet  Flowsheet Row Pulmonary Rehab from 08/20/2023 in [facility] Cardiac and Pulmonary Rehab  Picture Your Plate Total Score on Admission 69   Picture Your Plate Scores: <59 Unhealthy dietary pattern with much room for improvement. 41-50 Dietary pattern unlikely to meet recommendations for good health and room for improvement. 51-60 More healthful dietary pattern, with some room for improvement.  >60 Healthy dietary pattern, although there may be some specific behaviors that could be improved.   Nutrition Goals Re-Evaluation:  Nutrition Goals Re-Evaluation     Row Name 09/17/23 0747             Goals   Comment Patient was informed on why it is important to maintain a balanced diet when dealing with Respiratory  issues. Explained that it takes a lot of energy to breath and when they are short of breath often they will need to have a good diet to help keep up with the calories they are expending for breathing.       Expected Outcome Short: Choose and plan snacks accordingly to patients caloric intake to improve breathing. Long: Maintain a diet independently that meets their caloric intake to aid in daily shortness of breath.          Nutrition Goals Discharge (Final Nutrition Goals Re-Evaluation):  Nutrition Goals Re-Evaluation - 09/17/23 0747       Goals   Comment Patient was informed on why it is important to maintain a balanced diet when dealing with Respiratory issues. Explained that it takes a lot of energy to breath and when they are short of breath often they will need to have a good diet to help keep up with the calories  they are expending for breathing.    Expected Outcome Short: Choose and plan snacks accordingly to patients caloric intake to improve breathing. Long: Maintain a diet independently that meets their caloric intake to aid in daily shortness of breath.          Psychosocial: Target Goals: Acknowledge presence or absence of significant depression and/or stress, maximize coping skills, provide positive support system. Participant is able to verbalize types and ability to use techniques and skills needed for reducing stress and depression.   Education: Stress, Anxiety, and Depression - Group verbal and visual presentation to define topics covered.  Reviews how body is impacted by stress, anxiety, and depression.  Also discusses healthy ways to reduce stress and to treat/manage anxiety and depression.  Written material given at graduation. Flowsheet Row Pulmonary Rehab from 09/03/2023 in [facility] Cardiac and Pulmonary Rehab  Date 08/27/23  Educator SB  Instruction Review Code 1- [employer] Understanding    Education: Sleep Hygiene -Provides group verbal and written instruction about how sleep can affect your health.  Define sleep hygiene, discuss sleep cycles and impact of sleep habits. Review good sleep hygiene tips.    Initial Review & Psychosocial Screening:  Initial Psych Review & Screening - 08/17/23 1522       Initial Review   Current issues with Current Psychotropic Meds;History of Depression;Current Depression      Family Dynamics   Good Support System? Yes    Comments She 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 She can look to her son for support that lives with her. Her family is close by and she can rely on them.      Barriers   Psychosocial barriers to participate in program The patient should benefit from training in stress management and relaxation.      Screening Interventions   Interventions  Encouraged to exercise;To provide support and resources with identified psychosocial needs;Provide feedback about the scores to participant    Expected Outcomes Short Term goal: Utilizing psychosocial counselor, staff and physician to assist with identification of specific Stressors or current issues interfering with healing process. Setting desired goal for each stressor or current issue identified.;Long Term Goal: Stressors or current issues are controlled or eliminated.;Short Term goal: Identification and review with participant of any Quality of Life or Depression concerns found by scoring the questionnaire.;Long Term goal: The participant improves quality of Life and PHQ9 Scores as seen by post scores and/or verbalization of changes  Quality of Life Scores:  Scores of 19 and below usually indicate a poorer quality of life in these areas.  A difference of  2-3 points is a clinically meaningful difference.  A difference of 2-3 points in the total score of the Quality of Life Index has been associated with significant improvement in overall quality of life, self-image, physical symptoms, and general health in studies assessing change in quality of life.  PHQ-9: Review Flowsheet  More data exists      09/17/2023 08/20/2023 05/19/2023 04/02/2023 12/26/2022  Depression screen PHQ 2/9  Decreased Interest 1 1 0 0 0  Down, Depressed, Hopeless 1 1 0 0 1  PHQ - 2 Score 2 2 0 0 1  Altered sleeping 3 3 2 3 2   Tired, decreased energy 3 3 0 2 3  Change in appetite 2 2 0 0 1  Feeling bad or failure about yourself  0 0 0 0 0  Trouble concentrating 0 1 0 0 0  Moving slowly or fidgety/restless 0 2 0 0 0  Suicidal thoughts 0 0 0 0 0  PHQ-9 Score 10 13 2 5 7   Difficult doing work/chores Not difficult at all Somewhat difficult Not difficult at all Somewhat difficult Somewhat difficult   Interpretation of Total Score  Total Score Depression Severity:  1-4 = Minimal depression, 5-9 = Mild depression,  10-14 = Moderate depression, 15-19 = Moderately severe depression, 20-27 = Severe depression   Psychosocial Evaluation and Intervention:  Psychosocial Evaluation - 08/17/23 1525       Psychosocial Evaluation & Interventions   Interventions Encouraged to exercise with the program and follow exercise prescription;Relaxation education;Stress management education    Comments She 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 She can look to her son for support that lives with her. Her family is close by and she can rely on them.    Expected Outcomes Short: Start [facility] to help with mood. Long: Maintain a healthy mental state    Continue Psychosocial Services  Follow up required by staff          Psychosocial Re-Evaluation:  Psychosocial Re-Evaluation     Row Name 09/17/23 [phone]             Psychosocial Re-Evaluation   Current issues with Current Anxiety/Panic;Current Depression;History of Depression;Current Psychotropic Meds       Comments Reviewed patient health questionnaire (PHQ-9) with patient for follow up. Previously, patients score indicated signs/symptoms of depression.  Reviewed to see if patient is improving symptom wise while in program.  Score improved and patient states that it is because she is working towards better health.       Expected Outcomes Short: Continue to attend [facility] regularly for regular exercise and social engagement. Long: Continue to improve symptoms and manage a positive mental state.       Interventions Encouraged to attend Pulmonary Rehabilitation for the exercise       Continue Psychosocial Services  Follow up required by staff          Psychosocial Discharge (Final Psychosocial Re-Evaluation):  Psychosocial Re-Evaluation - 09/17/23 9247       Psychosocial Re-Evaluation   Current issues with Current Anxiety/Panic;Current Depression;History of Depression;Current  Psychotropic Meds    Comments Reviewed patient health questionnaire (PHQ-9) with patient for follow up. Previously, patients score indicated signs/symptoms of depression.  Reviewed to see if patient is improving symptom wise  while in program.  Score improved and patient states that it is because she is working towards better health.    Expected Outcomes Short: Continue to attend [facility] regularly for regular exercise and social engagement. Long: Continue to improve symptoms and manage a positive mental state.    Interventions Encouraged to attend Pulmonary Rehabilitation for the exercise    Continue Psychosocial Services  Follow up required by staff          Education: Education Goals: Education classes will be provided on a weekly basis, covering required topics. Participant will state understanding/return demonstration of topics presented.  Learning Barriers/Preferences:  Learning Barriers/Preferences - 08/17/23 1521       Learning Barriers/Preferences   Learning Barriers None    Learning Preferences None          General Pulmonary Education Topics:  Infection Prevention: - Provides verbal and written material to individual with discussion of infection control including proper hand washing and proper equipment cleaning during exercise session. Flowsheet Row Pulmonary Rehab from 09/03/2023 in [facility] Cardiac and Pulmonary Rehab  Date 08/17/23  Educator jh  Instruction Review Code 1- Verbalizes Understanding    Falls Prevention: - Provides verbal and written material to individual with discussion of falls prevention and safety. Flowsheet Row Pulmonary Rehab from 09/03/2023 in [facility] Cardiac and Pulmonary Rehab  Date 08/17/23  Educator jh  Instruction Review Code 1- Verbalizes Understanding    Chronic Lung Disease Review: - Group verbal instruction with posters, models, PowerPoint presentations and videos,  to review new updates, new respiratory medications, new advancements in  procedures and treatments. Providing information on websites and 800 numbers for continued self-education. Includes information about supplement oxygen, available portable oxygen systems, continuous and intermittent flow rates, oxygen safety, concentrators, and Medicare reimbursement for oxygen. Explanation of Pulmonary Drugs, including class, frequency, complications, importance of spacers, rinsing mouth after steroid MDI's, and proper cleaning methods for nebulizers. Review of basic lung anatomy and physiology related to function, structure, and complications of lung disease. Review of risk factors. Discussion about methods for diagnosing sleep apnea and types of masks and machines for OSA. Includes a review of the use of types of environmental controls: home humidity, furnaces, filters, dust mite/pet prevention, HEPA vacuums. Discussion about weather changes, air quality and the benefits of nasal washing. Instruction on Warning signs, infection symptoms, calling MD promptly, preventive modes, and value of vaccinations. Review of effective airway clearance, coughing and/or vibration techniques. Emphasizing that all should Create an Action Plan. Written material given at graduation. Flowsheet Row Pulmonary Rehab from 09/03/2023 in [facility] Cardiac and Pulmonary Rehab  Education need identified 08/20/23    AED/CPR: - Group verbal and written instruction with the use of models to demonstrate the basic use of the AED with the basic ABC's of resuscitation.    Anatomy and Cardiac Procedures: - Group verbal and visual presentation and models provide information about basic cardiac anatomy and function. Reviews the testing methods done to diagnose heart disease and the outcomes of the test results. Describes the treatment choices: Medical Management, Angioplasty, or Coronary Bypass Surgery for treating various heart conditions including Myocardial Infarction, Angina, Valve Disease, and Cardiac Arrhythmias.   Written material given at graduation.   Medication Safety: - Group verbal and visual instruction to review commonly prescribed medications for heart and lung disease. Reviews the medication, class of the drug, and side effects. Includes the steps to properly store meds and maintain the prescription regimen.  Written material given at graduation. Flowsheet Row Pulmonary  Rehab from 09/03/2023 in [facility] Cardiac and Pulmonary Rehab  Education need identified 08/20/23    Other: -Provides group and verbal instruction on various topics (see comments)   Knowledge Questionnaire Score:  Knowledge Questionnaire Score - 08/20/23 0952       Knowledge Questionnaire Score   Pre Score 14/18           Core Components/Risk Factors/Patient Goals at Admission:  Personal Goals and Risk Factors at Admission - 08/17/23 1521       Core Components/Risk Factors/Patient Goals on Admission    Weight Management Yes;Weight Loss    Intervention Weight Management: Develop a combined nutrition and exercise program designed to reach desired caloric intake, while maintaining appropriate intake of nutrient and fiber, sodium and fats, and appropriate energy expenditure required for the weight goal.;Weight Management: Provide education and appropriate resources to help participant work on and attain dietary goals.;Weight Management/Obesity: Establish reasonable short term and long term weight goals.    Expected Outcomes Short Term: Continue to assess and modify interventions until short term weight is achieved;Weight Loss: Understanding of general recommendations for a balanced deficit meal plan, which promotes 1-2 lb weight loss per week and includes a negative energy balance of [phone] kcal/d;Understanding recommendations for meals to include 15-35% energy as protein, 25-35% energy from fat, 35-60% energy from carbohydrates, less than 200mg  of dietary cholesterol, 20-35 gm of total fiber daily;Understanding of distribution  of calorie intake throughout the day with the consumption of 4-5 meals/snacks    Hypertension Yes    Intervention Provide education on lifestyle modifcations including regular physical activity/exercise, weight management, moderate sodium restriction and increased consumption of fresh fruit, vegetables, and low fat dairy, alcohol  moderation, and smoking cessation.;Monitor prescription use compliance.    Expected Outcomes Short Term: Continued assessment and intervention until BP is < 140/17mm HG in hypertensive participants. < 130/56mm HG in hypertensive participants with diabetes, heart failure or chronic kidney disease.;Long Term: Maintenance of blood pressure at goal levels.          Education:Diabetes - Individual verbal and written instruction to review signs/symptoms of diabetes, desired ranges of glucose level fasting, after meals and with exercise. Acknowledge that pre and post exercise glucose checks will be done for 3 sessions at entry of program.   Know Your Numbers and Heart Failure: - Group verbal and visual instruction to discuss disease risk factors for cardiac and pulmonary disease and treatment options.  Reviews associated critical values for Overweight/Obesity, Hypertension, Cholesterol, and Diabetes.  Discusses basics of heart failure: signs/symptoms and treatments.  Introduces Heart Failure Zone chart for action plan for heart failure.  Written material given at graduation.   Core Components/Risk Factors/Patient Goals Review:   Goals and Risk Factor Review     Row Name 09/17/23 0746             Core Components/Risk Factors/Patient Goals Review   Personal Goals Review Improve shortness of breath with ADL's       Review Spoke to patient about their shortness of breath and what they can do to improve. Patient has been informed of breathing techniques when starting the program. Patient is informed to tell staff if they have had any med changes and that certain meds they  are taking or not taking can be causing shortness of breath.       Expected Outcomes Short: Attend [facility] regularly to improve shortness of breath with ADL's. Long: maintain independence with ADL's  Core Components/Risk Factors/Patient Goals at Discharge (Final Review):   Goals and Risk Factor Review - 09/17/23 0746       Core Components/Risk Factors/Patient Goals Review   Personal Goals Review Improve shortness of breath with ADL's    Review Spoke to patient about their shortness of breath and what they can do to improve. Patient has been informed of breathing techniques when starting the program. Patient is informed to tell staff if they have had any med changes and that certain meds they are taking or not taking can be causing shortness of breath.    Expected Outcomes Short: Attend [facility] regularly to improve shortness of breath with ADL's. Long: maintain independence with ADL's          ITP Comments:  ITP Comments     Row Name 08/17/23 1518 08/20/23 0906 08/25/23 0821 08/26/23 0824 09/23/23 0912   ITP Comments Virtual Visit completed. Patient informed on EP and RD appointment and 6 Minute walk test. Patient also informed of patient health questionnaires on My Chart. Patient Verbalizes understanding. Visit diagnosis can be found in [facility] 06/11/2023. Completed and gym orientation for respiratory care services. Initial ITP created and sent for review to Dr. Faud Aleskerov, Medical Director. First full day of exercise!  Patient was oriented to gym and equipment including functions, settings, policies, and procedures.  Patient's individual exercise prescription and treatment plan were reviewed.  All starting workloads were established based on the results of the 6 minute walk test done at initial orientation visit.  The plan for exercise progression was also introduced and progression will be customized based on patient's performance and goals. 30 Day review completed. Medical  Director ITP review done, changes made as directed, and signed approval by Medical Director.    new to program 30 Day review completed. Medical Director ITP review done, changes made as directed, and signed approval by Medical Director.    Row Name 10/21/23 0748           ITP Comments 30 Day review completed. Medical Director ITP review done, changes made as directed, and signed approval by Medical Director.          Comments: 30 day review

## 2023-10-21 NOTE — Progress Notes (Signed)
 30 Day review completed. Medical Director ITP review done, changes made as directed, and signed approval by Medical Director. ? ?

## 2023-10-21 NOTE — Progress Notes (Signed)
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 the recumbent bike. We will continue to monitor her progress in the program. Pam is doing well in rehab. She has been able to increase her speed on the treadmill from 1. to 1.57mph. She was also able to maintain level 2 on both the T4 nustep, and recumbent bike. We will continue to monitor her progress in the program. Reviewed home exercise with pt today from 7:55am to 8:08am.  Pt plans to use her treadmill at home 3 additional days a week and add in 3 lb dumbbells at home for exercise. She also plans to look into the [facility]. Reviewed THR, pulse, RPE, sign and symptoms, pulse oximetery and when to call 911 or MD.  Also discussed weather considerations and indoor options.  Pt voiced understanding.   Expected Outcomes Short: Use RPE daily to regulate intensity. Long: Follow program prescription in THR. Short: Continue to follow exercise prescription. Long: Continue exercise to improve  strength and stamina. Short: Continue to follow exercise prescription. Long: Continue exercise to improve strength and stamina. Short: Continue to follow exercise prescription. Long: Continue exercise to improve strength and stamina. Short: Add 3 additional days of exercise at home. Long: Continue to exercise at home independently.    Row Name 10/14/23 0954             Exercise Goal Re-Evaluation   Exercise Goals Review Increase Physical Activity;Increase Strength and Stamina;Understanding of Exercise Prescription       Comments Holley is doing well in rehab. She has recently been able to increase her workload on the treadmill from 1.8 to 2. and no incline. She was also able to increase her level on the T4 nustep from level 2 to 4. We will continue to monitor her progress in the program.       Expected Outcomes Short: Continue to increase treadmill workload. Long: Continue exercise to improve strength and stamina.          Discharge Exercise Prescription (Final Exercise Prescription Changes):  Exercise Prescription Changes - 10/14/23 0900       Response to Exercise   Blood Pressure (Admit) 112/66    Blood Pressure (Exercise) 156/82    Blood Pressure (Exit) 102/62    Heart Rate (Admit) 81 bpm    Heart Rate (Exercise) 120 bpm    Heart Rate (Exit) 89 bpm    Oxygen Saturation (Admit) 97 %    Oxygen Saturation (Exercise) 95 %    Oxygen Saturation (Exit) 96 %    Rating of Perceived Exertion (Exercise) 15    Symptoms none    Duration Progress to 30 minutes of  aerobic without signs/symptoms of physical distress    Intensity THRR unchanged      Progression   Progression Continue to progress workloads to maintain intensity without signs/symptoms of physical distress.    Average METs 2.9      Resistance Training   Training Prescription Yes    Weight 3    Reps 10-15  Interval Training   Interval Training No      Treadmill   MPH 2.2    Grade 0    Minutes 15    METs 2.69       Recumbant Bike   Level 2.5    Watts 15    Minutes 15    METs 2.61      NuStep   Level 4    Minutes 15    METs 3.7      Home Exercise Plan   Plans to continue exercise at Home (comment)   Treadmill and 3 lb dumbbells at home. Might look into [facility] as well.   Frequency Add 3 additional days to program exercise sessions.    Initial Home Exercises Provided 10/06/23      Oxygen   Maintain Oxygen Saturation 88% or higher          Nutrition:  Target Goals: Understanding of nutrition guidelines, daily intake of sodium 1500mg , cholesterol 200mg , calories 30% from fat and 7% or less from saturated fats, daily to have 5 or more servings of fruits and vegetables.  Education: All About Nutrition: -Group instruction provided by verbal, written material, interactive activities, discussions, models, and posters to present general guidelines for heart healthy nutrition including fat, fiber, MyPlate, the role of sodium in heart healthy nutrition, utilization of the nutrition label, and utilization of this knowledge for meal planning. Follow up email sent as well. Written material given at graduation.   Biometrics:  Pre Biometrics - 08/20/23 0943       Pre Biometrics   Height 5' 4 (1.626 m)    Weight 170 lb 14.4 oz (77.5 kg)    Waist Circumference 39 inches    Hip Circumference 41 inches    Waist to Hip Ratio 0.95 %    BMI (Calculated) 29.32    Single Leg Stand 14.6 seconds           Nutrition Therapy Plan and Nutrition Goals:  Nutrition Therapy & Goals - 08/20/23 0952       Intervention Plan   Intervention Prescribe, educate and counsel regarding individualized specific dietary modifications aiming towards targeted core components such as weight, hypertension, lipid management, diabetes, heart failure and other comorbidities.    Expected Outcomes Short Term Goal: Understand basic principles of dietary content, such as calories, fat, sodium, cholesterol and  nutrients.;Short Term Goal: A plan has been developed with personal nutrition goals set during dietitian appointment.;Long Term Goal: Adherence to prescribed nutrition plan.          Nutrition Assessments:  MEDIFICTS Score Key: >=70 Need to make dietary changes  40-70 Heart Healthy Diet <= 40 Therapeutic Level Cholesterol Diet  Flowsheet Row Pulmonary Rehab from 08/20/2023 in [facility] Cardiac and Pulmonary Rehab  Picture Your Plate Total Score on Admission 69   Picture Your Plate Scores: <59 Unhealthy dietary pattern with much room for improvement. 41-50 Dietary pattern unlikely to meet recommendations for good health and room for improvement. 51-60 More healthful dietary pattern, with some room for improvement.  >60 Healthy dietary pattern, although there may be some specific behaviors that could be improved.   Nutrition Goals Re-Evaluation:  Nutrition Goals Re-Evaluation     Row Name 09/17/23 0747             Goals   Comment Patient was informed on why it is important to maintain a balanced diet when dealing with Respiratory issues. Explained that it takes a lot of energy to breath and  when they are short of breath often they will need to have a good diet to help keep up with the calories they are expending for breathing.       Expected Outcome Short: Choose and plan snacks accordingly to patients caloric intake to improve breathing. Long: Maintain a diet independently that meets their caloric intake to aid in daily shortness of breath.          Nutrition Goals Discharge (Final Nutrition Goals Re-Evaluation):  Nutrition Goals Re-Evaluation - 09/17/23 0747       Goals   Comment Patient was informed on why it is important to maintain a balanced diet when dealing with Respiratory issues. Explained that it takes a lot of energy to breath and when they are short of breath often they will need to have a good diet to help keep up with the calories they are expending for breathing.     Expected Outcome Short: Choose and plan snacks accordingly to patients caloric intake to improve breathing. Long: Maintain a diet independently that meets their caloric intake to aid in daily shortness of breath.          Psychosocial: Target Goals: Acknowledge presence or absence of significant depression and/or stress, maximize coping skills, provide positive support system. Participant is able to verbalize types and ability to use techniques and skills needed for reducing stress and depression.   Education: Stress, Anxiety, and Depression - Group verbal and visual presentation to define topics covered.  Reviews how body is impacted by stress, anxiety, and depression.  Also discusses healthy ways to reduce stress and to treat/manage anxiety and depression.  Written material given at graduation. Flowsheet Row Pulmonary Rehab from 09/03/2023 in [facility] Cardiac and Pulmonary Rehab  Date 08/27/23  Educator SB  Instruction Review Code 1- [employer] Understanding    Education: Sleep Hygiene -Provides group verbal and written instruction about how sleep can affect your health.  Define sleep hygiene, discuss sleep cycles and impact of sleep habits. Review good sleep hygiene tips.    Initial Review & Psychosocial Screening:  Initial Psych Review & Screening - 08/17/23 1522       Initial Review   Current issues with Current Psychotropic Meds;History of Depression;Current Depression      Family Dynamics   Good Support System? Yes    Comments She 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 She can look to her son for support that lives with her. Her family is close by and she can rely on them.      Barriers   Psychosocial barriers to participate in program The patient should benefit from training in stress management and relaxation.      Screening Interventions   Interventions Encouraged to exercise;To provide support and  resources with identified psychosocial needs;Provide feedback about the scores to participant    Expected Outcomes Short Term goal: Utilizing psychosocial counselor, staff and physician to assist with identification of specific Stressors or current issues interfering with healing process. Setting desired goal for each stressor or current issue identified.;Long Term Goal: Stressors or current issues are controlled or eliminated.;Short Term goal: Identification and review with participant of any Quality of Life or Depression concerns found by scoring the questionnaire.;Long Term goal: The participant improves quality of Life and PHQ9 Scores as seen by post scores and/or verbalization of changes          Quality of Life Scores:  Scores of 19 and below usually indicate a poorer quality of life in these areas.  A difference of  2-3 points is a clinically meaningful difference.  A difference of 2-3 points in the total score of the Quality of Life Index has been associated with significant improvement in overall quality of life, self-image, physical symptoms, and general health in studies assessing change in quality of life.  PHQ-9: Review Flowsheet  More data exists      09/17/2023 08/20/2023 05/19/2023 04/02/2023 12/26/2022  Depression screen PHQ 2/9  Decreased Interest 1 1 0 0 0  Down, Depressed, Hopeless 1 1 0 0 1  PHQ - 2 Score 2 2 0 0 1  Altered sleeping 3 3 2 3 2   Tired, decreased energy 3 3 0 2 3  Change in appetite 2 2 0 0 1  Feeling bad or failure about yourself  0 0 0 0 0  Trouble concentrating 0 1 0 0 0  Moving slowly or fidgety/restless 0 2 0 0 0  Suicidal thoughts 0 0 0 0 0  PHQ-9 Score 10 13 2 5 7   Difficult doing work/chores Not difficult at all Somewhat difficult Not difficult at all Somewhat difficult Somewhat difficult   Interpretation of Total Score  Total Score Depression Severity:  1-4 = Minimal depression, 5-9 = Mild depression, 10-14 = Moderate depression, 15-19 =  Moderately severe depression, 20-27 = Severe depression   Psychosocial Evaluation and Intervention:  Psychosocial Evaluation - 08/17/23 1525       Psychosocial Evaluation & Interventions   Interventions Encouraged to exercise with the program and follow exercise prescription;Relaxation education;Stress management education    Comments She 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 She can look to her son for support that lives with her. Her family is close by and she can rely on them.    Expected Outcomes Short: Start [facility] to help with mood. Long: Maintain a healthy mental state    Continue Psychosocial Services  Follow up required by staff          Psychosocial Re-Evaluation:  Psychosocial Re-Evaluation     Row Name 09/17/23 [phone]             Psychosocial Re-Evaluation   Current issues with Current Anxiety/Panic;Current Depression;History of Depression;Current Psychotropic Meds       Comments Reviewed patient health questionnaire (PHQ-9) with patient for follow up. Previously, patients score indicated signs/symptoms of depression.  Reviewed to see if patient is improving symptom wise while in program.  Score improved and patient states that it is because she is working towards better health.       Expected Outcomes Short: Continue to attend [facility] regularly for regular exercise and social engagement. Long: Continue to improve symptoms and manage a positive mental state.       Interventions Encouraged to attend Pulmonary Rehabilitation for the exercise       Continue Psychosocial Services  Follow up required by staff          Psychosocial Discharge (Final Psychosocial Re-Evaluation):  Psychosocial Re-Evaluation - 09/17/23 9247       Psychosocial Re-Evaluation   Current issues with Current Anxiety/Panic;Current Depression;History of Depression;Current Psychotropic Meds    Comments Reviewed patient  health questionnaire (PHQ-9) with patient for follow up. Previously, patients score indicated signs/symptoms of depression.  Reviewed to see if patient is improving symptom wise while in program.  Score  improved and patient states that it is because she is working towards better health.    Expected Outcomes Short: Continue to attend [facility] regularly for regular exercise and social engagement. Long: Continue to improve symptoms and manage a positive mental state.    Interventions Encouraged to attend Pulmonary Rehabilitation for the exercise    Continue Psychosocial Services  Follow up required by staff          Education: Education Goals: Education classes will be provided on a weekly basis, covering required topics. Participant will state understanding/return demonstration of topics presented.  Learning Barriers/Preferences:  Learning Barriers/Preferences - 08/17/23 1521       Learning Barriers/Preferences   Learning Barriers None    Learning Preferences None          General Pulmonary Education Topics:  Infection Prevention: - Provides verbal and written material to individual with discussion of infection control including proper hand washing and proper equipment cleaning during exercise session. Flowsheet Row Pulmonary Rehab from 09/03/2023 in [facility] Cardiac and Pulmonary Rehab  Date 08/17/23  Educator jh  Instruction Review Code 1- Verbalizes Understanding    Falls Prevention: - Provides verbal and written material to individual with discussion of falls prevention and safety. Flowsheet Row Pulmonary Rehab from 09/03/2023 in [facility] Cardiac and Pulmonary Rehab  Date 08/17/23  Educator jh  Instruction Review Code 1- Verbalizes Understanding    Chronic Lung Disease Review: - Group verbal instruction with posters, models, PowerPoint presentations and videos,  to review new updates, new respiratory medications, new advancements in procedures and treatments. Providing information  on websites and 800 numbers for continued self-education. Includes information about supplement oxygen, available portable oxygen systems, continuous and intermittent flow rates, oxygen safety, concentrators, and Medicare reimbursement for oxygen. Explanation of Pulmonary Drugs, including class, frequency, complications, importance of spacers, rinsing mouth after steroid MDI's, and proper cleaning methods for nebulizers. Review of basic lung anatomy and physiology related to function, structure, and complications of lung disease. Review of risk factors. Discussion about methods for diagnosing sleep apnea and types of masks and machines for OSA. Includes a review of the use of types of environmental controls: home humidity, furnaces, filters, dust mite/pet prevention, HEPA vacuums. Discussion about weather changes, air quality and the benefits of nasal washing. Instruction on Warning signs, infection symptoms, calling MD promptly, preventive modes, and value of vaccinations. Review of effective airway clearance, coughing and/or vibration techniques. Emphasizing that all should Create an Action Plan. Written material given at graduation. Flowsheet Row Pulmonary Rehab from 09/03/2023 in [facility] Cardiac and Pulmonary Rehab  Education need identified 08/20/23    AED/CPR: - Group verbal and written instruction with the use of models to demonstrate the basic use of the AED with the basic ABC's of resuscitation.    Anatomy and Cardiac Procedures: - Group verbal and visual presentation and models provide information about basic cardiac anatomy and function. Reviews the testing methods done to diagnose heart disease and the outcomes of the test results. Describes the treatment choices: Medical Management, Angioplasty, or Coronary Bypass Surgery for treating various heart conditions including Myocardial Infarction, Angina, Valve Disease, and Cardiac Arrhythmias.  Written material given at graduation.   Medication  Safety: - Group verbal and visual instruction to review commonly prescribed medications for heart and lung disease. Reviews the medication, class of the drug, and side effects. Includes the steps to properly store meds and maintain the prescription regimen.  Written material given at graduation. Flowsheet Row Pulmonary Rehab from 09/03/2023 in [facility]  Cardiac and Pulmonary Rehab  Education need identified 08/20/23    Other: -Provides group and verbal instruction on various topics (see comments)   Knowledge Questionnaire Score:  Knowledge Questionnaire Score - 08/20/23 0952       Knowledge Questionnaire Score   Pre Score 14/18           Core Components/Risk Factors/Patient Goals at Admission:  Personal Goals and Risk Factors at Admission - 08/17/23 1521       Core Components/Risk Factors/Patient Goals on Admission    Weight Management Yes;Weight Loss    Intervention Weight Management: Develop a combined nutrition and exercise program designed to reach desired caloric intake, while maintaining appropriate intake of nutrient and fiber, sodium and fats, and appropriate energy expenditure required for the weight goal.;Weight Management: Provide education and appropriate resources to help participant work on and attain dietary goals.;Weight Management/Obesity: Establish reasonable short term and long term weight goals.    Expected Outcomes Short Term: Continue to assess and modify interventions until short term weight is achieved;Weight Loss: Understanding of general recommendations for a balanced deficit meal plan, which promotes 1-2 lb weight loss per week and includes a negative energy balance of [phone] kcal/d;Understanding recommendations for meals to include 15-35% energy as protein, 25-35% energy from fat, 35-60% energy from carbohydrates, less than 200mg  of dietary cholesterol, 20-35 gm of total fiber daily;Understanding of distribution of calorie intake throughout the day with the  consumption of 4-5 meals/snacks    Hypertension Yes    Intervention Provide education on lifestyle modifcations including regular physical activity/exercise, weight management, moderate sodium restriction and increased consumption of fresh fruit, vegetables, and low fat dairy, alcohol  moderation, and smoking cessation.;Monitor prescription use compliance.    Expected Outcomes Short Term: Continued assessment and intervention until BP is < 140/69mm HG in hypertensive participants. < 130/61mm HG in hypertensive participants with diabetes, heart failure or chronic kidney disease.;Long Term: Maintenance of blood pressure at goal levels.          Education:Diabetes - Individual verbal and written instruction to review signs/symptoms of diabetes, desired ranges of glucose level fasting, after meals and with exercise. Acknowledge that pre and post exercise glucose checks will be done for 3 sessions at entry of program.   Know Your Numbers and Heart Failure: - Group verbal and visual instruction to discuss disease risk factors for cardiac and pulmonary disease and treatment options.  Reviews associated critical values for Overweight/Obesity, Hypertension, Cholesterol, and Diabetes.  Discusses basics of heart failure: signs/symptoms and treatments.  Introduces Heart Failure Zone chart for action plan for heart failure.  Written material given at graduation.   Core Components/Risk Factors/Patient Goals Review:   Goals and Risk Factor Review     Row Name 09/17/23 0746             Core Components/Risk Factors/Patient Goals Review   Personal Goals Review Improve shortness of breath with ADL's       Review Spoke to patient about their shortness of breath and what they can do to improve. Patient has been informed of breathing techniques when starting the program. Patient is informed to tell staff if they have had any med changes and that certain meds they are taking or not taking can be causing  shortness of breath.       Expected Outcomes Short: Attend [facility] regularly to improve shortness of breath with ADL's. Long: maintain independence with ADL's          Core Components/Risk Factors/Patient Goals at  Discharge (Final Review):   Goals and Risk Factor Review - 09/17/23 0746       Core Components/Risk Factors/Patient Goals Review   Personal Goals Review Improve shortness of breath with ADL's    Review Spoke to patient about their shortness of breath and what they can do to improve. Patient has been informed of breathing techniques when starting the program. Patient is informed to tell staff if they have had any med changes and that certain meds they are taking or not taking can be causing shortness of breath.    Expected Outcomes Short: Attend [facility] regularly to improve shortness of breath with ADL's. Long: maintain independence with ADL's          ITP Comments:  ITP Comments     Row Name 08/17/23 1518 08/20/23 0906 08/25/23 0821 08/26/23 0824 09/23/23 0912   ITP Comments Virtual Visit completed. Patient informed on EP and RD appointment and 6 Minute walk test. Patient also informed of patient health questionnaires on My Chart. Patient Verbalizes understanding. Visit diagnosis can be found in [facility] 06/11/2023. Completed and gym orientation for respiratory care services. Initial ITP created and sent for review to Dr. Faud Aleskerov, Medical Director. First full day of exercise!  Patient was oriented to gym and equipment including functions, settings, policies, and procedures.  Patient's individual exercise prescription and treatment plan were reviewed.  All starting workloads were established based on the results of the 6 minute walk test done at initial orientation visit.  The plan for exercise progression was also introduced and progression will be customized based on patient's performance and goals. 30 Day review completed. Medical Director ITP review done, changes made as  directed, and signed approval by Medical Director.    new to program 30 Day review completed. Medical Director ITP review done, changes made as directed, and signed approval by Medical Director.    Row Name 10/21/23 0748 10/21/23 1548         ITP Comments 30 Day review completed. Medical Director ITP review done, changes made as directed, and signed approval by Medical Director. Pam is being discharged at this time due to lack of transportation and her son being sick. She completed 11 of 36 sessions.         Comments: Early discharge ITP

## 2023-10-22 ENCOUNTER — Encounter

## 2023-10-22 DIAGNOSIS — J301 Allergic rhinitis due to pollen: Secondary | ICD-10-CM | POA: Diagnosis not present

## 2023-10-26 ENCOUNTER — Other Ambulatory Visit (INDEPENDENT_AMBULATORY_CARE_PROVIDER_SITE_OTHER): Payer: Self-pay | Admitting: Vascular Surgery

## 2023-10-26 DIAGNOSIS — M7989 Other specified soft tissue disorders: Secondary | ICD-10-CM

## 2023-10-27 ENCOUNTER — Encounter

## 2023-10-28 ENCOUNTER — Ambulatory Visit (INDEPENDENT_AMBULATORY_CARE_PROVIDER_SITE_OTHER): Admitting: Nurse Practitioner

## 2023-10-28 ENCOUNTER — Other Ambulatory Visit: Payer: Self-pay | Admitting: Nurse Practitioner

## 2023-10-28 ENCOUNTER — Encounter (INDEPENDENT_AMBULATORY_CARE_PROVIDER_SITE_OTHER)

## 2023-10-29 ENCOUNTER — Other Ambulatory Visit: Payer: Self-pay | Admitting: Nurse Practitioner

## 2023-10-29 ENCOUNTER — Encounter

## 2023-10-29 DIAGNOSIS — J432 Centrilobular emphysema: Secondary | ICD-10-CM

## 2023-10-30 ENCOUNTER — Ambulatory Visit: Admitting: Nurse Practitioner

## 2023-11-03 ENCOUNTER — Encounter

## 2023-11-05 ENCOUNTER — Encounter

## 2023-11-09 ENCOUNTER — Other Ambulatory Visit: Payer: Self-pay | Admitting: Nurse Practitioner

## 2023-11-09 DIAGNOSIS — J432 Centrilobular emphysema: Secondary | ICD-10-CM

## 2023-11-09 DIAGNOSIS — R0609 Other forms of dyspnea: Secondary | ICD-10-CM

## 2023-11-10 ENCOUNTER — Encounter

## 2023-11-11 ENCOUNTER — Encounter: Admitting: Internal Medicine

## 2023-11-12 ENCOUNTER — Encounter

## 2023-11-17 ENCOUNTER — Encounter

## 2023-11-19 ENCOUNTER — Encounter

## 2023-11-24 ENCOUNTER — Encounter

## 2023-11-24 ENCOUNTER — Other Ambulatory Visit: Payer: Self-pay | Admitting: Nurse Practitioner

## 2023-11-25 ENCOUNTER — Encounter: Admitting: Internal Medicine

## 2023-11-26 ENCOUNTER — Encounter

## 2023-11-27 ENCOUNTER — Other Ambulatory Visit: Payer: Self-pay

## 2023-11-29 NOTE — Progress Notes (Signed)
 Medication refilled

## 2023-12-01 ENCOUNTER — Encounter

## 2023-12-03 ENCOUNTER — Encounter

## 2023-12-06 ENCOUNTER — Other Ambulatory Visit: Payer: Self-pay | Admitting: Internal Medicine

## 2023-12-08 ENCOUNTER — Other Ambulatory Visit: Payer: Self-pay | Admitting: Medical

## 2023-12-08 ENCOUNTER — Encounter

## 2023-12-08 MED ORDER — CLOPIDOGREL BISULFATE 75 MG PO TABS: 75.0000 mg | ORAL_TABLET | Freq: Every day | ORAL | 2 refills | Status: AC

## 2023-12-09 ENCOUNTER — Emergency Department

## 2023-12-09 ENCOUNTER — Observation Stay
Admission: EM | Admit: 2023-12-09 | Discharge: 2023-12-11 | Disposition: A | Discharge status: Home Health | Attending: Internal Medicine | Admitting: Internal Medicine

## 2023-12-09 ENCOUNTER — Encounter: Payer: Self-pay | Admitting: Emergency Medicine

## 2023-12-09 ENCOUNTER — Other Ambulatory Visit: Payer: Self-pay

## 2023-12-09 DIAGNOSIS — J449 Chronic obstructive pulmonary disease, unspecified: Secondary | ICD-10-CM | POA: Insufficient documentation

## 2023-12-09 DIAGNOSIS — Z7982 Long term (current) use of aspirin: Secondary | ICD-10-CM | POA: Diagnosis not present

## 2023-12-09 DIAGNOSIS — M4802 Spinal stenosis, cervical region: Secondary | ICD-10-CM | POA: Diagnosis not present

## 2023-12-09 DIAGNOSIS — N1831 Chronic kidney disease, stage 3a: Secondary | ICD-10-CM | POA: Diagnosis not present

## 2023-12-09 DIAGNOSIS — M25552 Pain in left hip: Secondary | ICD-10-CM | POA: Diagnosis not present

## 2023-12-09 DIAGNOSIS — S7002XA Contusion of left hip, initial encounter: Secondary | ICD-10-CM | POA: Diagnosis not present

## 2023-12-09 DIAGNOSIS — R0602 Shortness of breath: Secondary | ICD-10-CM | POA: Insufficient documentation

## 2023-12-09 DIAGNOSIS — M545 Low back pain, unspecified: Secondary | ICD-10-CM | POA: Diagnosis not present

## 2023-12-09 DIAGNOSIS — I129 Hypertensive chronic kidney disease with stage 1 through stage 4 chronic kidney disease, or unspecified chronic kidney disease: Secondary | ICD-10-CM | POA: Insufficient documentation

## 2023-12-09 DIAGNOSIS — M79609 Pain in unspecified limb: Secondary | ICD-10-CM | POA: Diagnosis present

## 2023-12-09 DIAGNOSIS — W19XXXA Unspecified fall, initial encounter: Secondary | ICD-10-CM | POA: Diagnosis not present

## 2023-12-09 DIAGNOSIS — R519 Headache, unspecified: Secondary | ICD-10-CM | POA: Diagnosis not present

## 2023-12-09 DIAGNOSIS — S79912A Unspecified injury of left hip, initial encounter: Secondary | ICD-10-CM | POA: Diagnosis not present

## 2023-12-09 DIAGNOSIS — I10 Essential (primary) hypertension: Secondary | ICD-10-CM | POA: Diagnosis not present

## 2023-12-09 DIAGNOSIS — Z8673 Personal history of transient ischemic attack (TIA), and cerebral infarction without residual deficits: Secondary | ICD-10-CM | POA: Diagnosis not present

## 2023-12-09 DIAGNOSIS — Y92009 Unspecified place in unspecified non-institutional (private) residence as the place of occurrence of the external cause: Secondary | ICD-10-CM | POA: Diagnosis not present

## 2023-12-09 DIAGNOSIS — E663 Overweight: Secondary | ICD-10-CM | POA: Diagnosis not present

## 2023-12-09 DIAGNOSIS — F419 Anxiety disorder, unspecified: Secondary | ICD-10-CM | POA: Diagnosis not present

## 2023-12-09 DIAGNOSIS — Z7901 Long term (current) use of anticoagulants: Secondary | ICD-10-CM | POA: Insufficient documentation

## 2023-12-09 DIAGNOSIS — I2510 Atherosclerotic heart disease of native coronary artery without angina pectoris: Secondary | ICD-10-CM | POA: Diagnosis not present

## 2023-12-09 DIAGNOSIS — Y92002 Bathroom of unspecified non-institutional (private) residence single-family (private) house as the place of occurrence of the external cause: Secondary | ICD-10-CM | POA: Diagnosis not present

## 2023-12-09 DIAGNOSIS — E876 Hypokalemia: Secondary | ICD-10-CM | POA: Insufficient documentation

## 2023-12-09 DIAGNOSIS — M25512 Pain in left shoulder: Secondary | ICD-10-CM | POA: Diagnosis not present

## 2023-12-09 DIAGNOSIS — R262 Difficulty in walking, not elsewhere classified: Secondary | ICD-10-CM

## 2023-12-09 DIAGNOSIS — J42 Unspecified chronic bronchitis: Secondary | ICD-10-CM | POA: Diagnosis not present

## 2023-12-09 DIAGNOSIS — W010XXA Fall on same level from slipping, tripping and stumbling without subsequent striking against object, initial encounter: Secondary | ICD-10-CM | POA: Diagnosis not present

## 2023-12-09 DIAGNOSIS — E785 Hyperlipidemia, unspecified: Secondary | ICD-10-CM | POA: Diagnosis not present

## 2023-12-09 DIAGNOSIS — F32A Depression, unspecified: Secondary | ICD-10-CM | POA: Diagnosis not present

## 2023-12-09 DIAGNOSIS — Z79899 Other long term (current) drug therapy: Secondary | ICD-10-CM | POA: Diagnosis not present

## 2023-12-09 DIAGNOSIS — M542 Cervicalgia: Secondary | ICD-10-CM | POA: Diagnosis not present

## 2023-12-09 DIAGNOSIS — M47812 Spondylosis without myelopathy or radiculopathy, cervical region: Secondary | ICD-10-CM | POA: Diagnosis not present

## 2023-12-09 DIAGNOSIS — Z6830 Body mass index (BMI) 30.0-30.9, adult: Secondary | ICD-10-CM | POA: Insufficient documentation

## 2023-12-09 LAB — COMPREHENSIVE METABOLIC PANEL WITH GFR
ALT: 32 U/L (ref 0–44)
AST: 25 U/L (ref 15–41)
Albumin: 3.9 g/dL (ref 3.5–5.0)
Alkaline Phosphatase: 72 U/L (ref 38–126)
Anion gap: 14 (ref 5–15)
BUN: 14 mg/dL (ref 8–23)
CO2: 23 mmol/L (ref 22–32)
Calcium: 9.2 mg/dL (ref 8.9–10.3)
Chloride: 106 mmol/L (ref 98–111)
Creatinine, Ser: 1.16 mg/dL — ABNORMAL HIGH (ref 0.44–1.00)
GFR, Estimated: 53 mL/min — ABNORMAL LOW (ref >60)
Glucose, Bld: 103 mg/dL — ABNORMAL HIGH (ref 70–99)
Potassium: 3.4 mmol/L — ABNORMAL LOW (ref 3.5–5.1)
Sodium: 143 mmol/L (ref 135–145)
Total Bilirubin: 0.3 mg/dL (ref 0.0–1.2)
Total Protein: 6.5 g/dL (ref 6.5–8.1)

## 2023-12-09 LAB — CBC WITH DIFFERENTIAL/PLATELET
Abs Immature Granulocytes: 0.04 K/uL (ref 0.00–0.07)
Basophils Absolute: 0 K/uL (ref 0.0–0.1)
Basophils Relative: 0 %
Eosinophils Absolute: 0.3 K/uL (ref 0.0–0.5)
Eosinophils Relative: 3 %
HCT: 42.8 % (ref 36.0–46.0)
Hemoglobin: 14.3 g/dL (ref 12.0–15.0)
Immature Granulocytes: 0 %
Lymphocytes Relative: 25 %
Lymphs Abs: 2.5 K/uL (ref 0.7–4.0)
MCH: 31.2 pg (ref 26.0–34.0)
MCHC: 33.4 g/dL (ref 30.0–36.0)
MCV: 93.2 fL (ref 80.0–100.0)
Monocytes Absolute: 0.7 K/uL (ref 0.1–1.0)
Monocytes Relative: 7 %
Neutro Abs: 6.6 K/uL (ref 1.7–7.7)
Neutrophils Relative %: 65 %
Platelets: 278 K/uL (ref 150–400)
RBC: 4.59 MIL/uL (ref 3.87–5.11)
RDW: 13 % (ref 11.5–15.5)
WBC: 10.1 K/uL (ref 4.0–10.5)
nRBC: 0 % (ref 0.0–0.2)

## 2023-12-09 MED ORDER — ALBUTEROL SULFATE HFA 108 (90 BASE) MCG/ACT IN AERS: 2.0000 | INHALATION_SPRAY | RESPIRATORY_TRACT | Status: DC | PRN

## 2023-12-09 MED ORDER — OXYCODONE-ACETAMINOPHEN 5-325 MG PO TABS: 1.0000 | ORAL_TABLET | Freq: Three times a day (TID) | ORAL | 0 refills | Status: DC | PRN

## 2023-12-09 MED ORDER — DIAZEPAM 5 MG PO TABS
5.0000 mg | ORAL_TABLET | Freq: Two times a day (BID) | ORAL | Status: DC | PRN
  Administered 2023-12-10 – 2023-12-11 (×2): 5 mg via ORAL
  Filled 2023-12-09 (×2): qty 1

## 2023-12-09 MED ORDER — DM-GUAIFENESIN ER 30-600 MG PO TB12: 1.0000 | ORAL_TABLET | Freq: Two times a day (BID) | ORAL | Status: DC | PRN

## 2023-12-09 MED ORDER — OXYCODONE HCL 5 MG PO TABS
5.0000 mg | ORAL_TABLET | Freq: Once | ORAL | Status: AC
  Administered 2023-12-09: 5 mg via ORAL
  Filled 2023-12-09: qty 1

## 2023-12-09 MED ORDER — ONDANSETRON 4 MG PO TBDP
4.0000 mg | ORAL_TABLET | Freq: Once | ORAL | Status: AC
  Administered 2023-12-09: 4 mg via ORAL
  Filled 2023-12-09: qty 1

## 2023-12-09 MED ORDER — MORPHINE SULFATE (PF) 2 MG/ML IV SOLN
2.0000 mg | INTRAVENOUS | Status: DC | PRN
  Administered 2023-12-10 – 2023-12-11 (×4): 2 mg via INTRAVENOUS
  Filled 2023-12-09 (×4): qty 1

## 2023-12-09 MED ORDER — HYDRALAZINE HCL 20 MG/ML IJ SOLN: 5.0000 mg | INTRAMUSCULAR | Status: DC | PRN

## 2023-12-09 MED ORDER — METHOCARBAMOL 500 MG PO TABS
500.0000 mg | ORAL_TABLET | Freq: Three times a day (TID) | ORAL | Status: DC | PRN
  Administered 2023-12-10: 500 mg via ORAL
  Filled 2023-12-09: qty 1

## 2023-12-09 MED ORDER — ONDANSETRON HCL 4 MG/2ML IJ SOLN
4.0000 mg | Freq: Once | INTRAMUSCULAR | Status: AC
  Administered 2023-12-09: 4 mg via INTRAVENOUS
  Filled 2023-12-09: qty 2

## 2023-12-09 MED ORDER — ALBUTEROL SULFATE (2.5 MG/3ML) 0.083% IN NEBU: 2.5000 mg | INHALATION_SOLUTION | RESPIRATORY_TRACT | Status: DC | PRN

## 2023-12-09 MED ORDER — ONDANSETRON HCL 4 MG/2ML IJ SOLN
4.0000 mg | Freq: Three times a day (TID) | INTRAMUSCULAR | Status: DC | PRN
  Administered 2023-12-10 – 2023-12-11 (×3): 4 mg via INTRAVENOUS
  Filled 2023-12-09 (×3): qty 2

## 2023-12-09 MED ORDER — LIDOCAINE 5 % EX PTCH
1.0000 | MEDICATED_PATCH | Freq: Every day | CUTANEOUS | Status: DC
  Administered 2023-12-10 (×2): 1 via TRANSDERMAL
  Filled 2023-12-09 (×2): qty 1

## 2023-12-09 MED ORDER — POTASSIUM CHLORIDE CRYS ER 20 MEQ PO TBCR
40.0000 meq | EXTENDED_RELEASE_TABLET | Freq: Once | ORAL | Status: AC
  Administered 2023-12-10: 40 meq via ORAL
  Filled 2023-12-09: qty 2

## 2023-12-09 MED ORDER — OXYCODONE-ACETAMINOPHEN 5-325 MG PO TABS
1.0000 | ORAL_TABLET | ORAL | Status: DC | PRN
  Administered 2023-12-10 – 2023-12-11 (×5): 1 via ORAL
  Filled 2023-12-09 (×5): qty 1

## 2023-12-09 MED ORDER — METHOCARBAMOL 500 MG PO TABS: 500.0000 mg | ORAL_TABLET | Freq: Three times a day (TID) | ORAL | 0 refills | Status: DC | PRN

## 2023-12-09 MED ORDER — FENTANYL CITRATE PF 50 MCG/ML IJ SOSY
50.0000 ug | PREFILLED_SYRINGE | Freq: Once | INTRAMUSCULAR | Status: AC
  Administered 2023-12-09: 50 ug via INTRAVENOUS
  Filled 2023-12-09: qty 1

## 2023-12-09 MED ORDER — ACETAMINOPHEN 325 MG PO TABS: 650.0000 mg | ORAL_TABLET | Freq: Four times a day (QID) | ORAL | Status: DC | PRN
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
## 2023-12-09 NOTE — ED Triage Notes (Signed)
 Patient to ED via POV for a fall. Pt states she was taking her mother to the bathroom and tripped falling on her left side. C/o of pain on the complete left side. Denies hitting head or LOC. +blood thinners.

## 2023-12-09 NOTE — Discharge Instructions (Signed)
 Rest, apply ice off-and-on 20 minutes/h while awake.  Take medications as prescribed but do not take them at the same time.  Alternate them with at least 4 hours between each dose.  Follow-up with primary care if not improving over the week.  If symptoms change or worsen and you are unable to schedule an appointment, please return to the emergency department.

## 2023-12-09 NOTE — ED Provider Triage Note (Signed)
 Emergency Medicine Provider Triage Evaluation Note  [name] , a 63 y.o. female  was evaluated in triage.  Pt complains of neck pain.  Patient had a mechanical fall today with posterior left neck pain, left shoulder pain, left hip pain.  Patient denies loss of consciousness  Review of Systems  Positive:  Negative:   Physical Exam  BP (!) 155/101 (BP Location: Right Arm)   Pulse (!) 126 Comment: pt crying  Temp 98.1 F (36.7 C) (Oral)   Resp 20   Ht 5' 3 (1.6 m)   Wt 74.4 kg   LMP 02/03/2011   SpO2 100%   BMI 29.05 kg/m  Gen:   Awake, no distress   Resp:  Normal effort  MSK:   Moves extremities without difficulty  Other:  No evidence of deformity in the left shoulder, left elbow, left wrist, left knee.   Medical Decision Making  Medically screening exam initiated at 4:49 PM.  Appropriate orders placed.  Daviana Haymaker Eckstrom was informed that the remainder of the evaluation will be completed by another provider, this initial triage assessment does not replace that evaluation, and the importance of remaining in the ED until their evaluation is complete.  Patient who presents today with mechanical fall with posterior left neck pain, left hip pain, left knee pain.  Ordered CT of the neck,  x-ray of the left shoulder, x-ray of left hip, lumbar x-ray   Janit Kast, PA-C 12/09/23 1650

## 2023-12-09 NOTE — ED Provider Notes (Cosign Needed Addendum)
 [facility] Provider Note    Event Date/Time   First MD Initiated Contact with Patient 12/09/23 1801     (approximate)   History   Fall   HPI  [name] is a 63 y.o. female with history of COPD, hypertension, CVA, PVD and as listed in EMR presents to the emergency department for treatment and evaluation after mechanical, nonsyncopal fall today.  She was assisting her mother to the bathroom and tripped.  She fell backward and landed on her left side.  No head strike or loss of consciousness. Left hip is most painful. She is currently on plavix .     Physical Exam    Vitals:   12/09/23 1647 12/09/23 2241  BP: (!) 155/101 (!) 176/104  Pulse:  91  Resp:  20  Temp:    SpO2:  99%    General: Awake, no distress.  CV:  Good peripheral perfusion.  Resp:  Normal effort.  Abd:  No distention.  Other:  Pain in left hip increases with external rotation. Contusion to left upper arm.   ED Results / Procedures / Treatments   Labs (all labs ordered are listed, but only abnormal results are displayed)  Labs Reviewed  COMPREHENSIVE METABOLIC PANEL WITH GFR - Abnormal; Notable for the following components:      Result Value   Potassium 3.4 (*)    Glucose, Bld 103 (*)    Creatinine, Ser 1.16 (*)    GFR, Estimated 53 (*)    All other components within normal limits  CBC WITH DIFFERENTIAL/PLATELET  URINALYSIS, ROUTINE W REFLEX MICROSCOPIC     EKG  Not indicated.   RADIOLOGY  Image and radiology report reviewed and interpreted by me. Radiology report consistent with the same.  CT cervical spine negative for acute concerns.  X-ray of the left shoulder, lumbar spine and left hip/pelvis are all negative for acute concerns.  CT image of the left hip shows no acute bony abnormality however there is a hematoma in the left buttock.  PROCEDURES:  Critical Care performed: No  Procedures   MEDICATIONS ORDERED IN ED:  Medications  oxyCODONE   (Oxy IR/ROXICODONE ) immediate release tablet 5 mg (5 mg Oral Given 12/09/23 1944)  ondansetron  (ZOFRAN -ODT) disintegrating tablet 4 mg (4 mg Oral Given 12/09/23 1945)  fentaNYL  (SUBLIMAZE ) injection 50 mcg (50 mcg Intravenous Given 12/09/23 2235)  ondansetron  (ZOFRAN ) injection 4 mg (4 mg Intravenous Given 12/09/23 2234)     IMPRESSION / MDM / ASSESSMENT AND PLAN / ED COURSE   I have reviewed the triage note and vital signs. Vital signs stable   Differential diagnosis includes, but is not limited to, left hip fracture, pelvic fracture, shoulder dislocation, shoulder strain, lumbar vertebral injury, cervical vertebral injury, cervical strain, lumbar strain  Patient's presentation is most consistent with acute presentation with potential threat to life or bodily function.  63 year old female presenting to the emergency department after mechanical, nonsyncopal fall while helping her mom to the bathroom earlier today.  See HPI for further details.  On exam, she has pain with external rotation of the left hip.  X-ray image was negative for acute fracture however due to exam findings CT ordered. CT of the cervical spine and left hip are both negative for bony abnormality.  There is indication of a hematoma to the left buttock. Despite pain medication, patient is still having difficulty moving and will require assistance with a walker at home.  Walker provided prior to discharge.  Medication  teaching complete.  She is aware not to take any additional Tylenol  if taking the prescribed pain medication.  She is also aware not to take the muscle relaxer and pain medication within 4 hours of 1 another. Outpatient follow-up and ER return precautions discussed.  ----------------------------------------- 10:01 PM on 12/09/2023 ----------------------------------------- Patient unable to tolerate weight bearing even with walker. She doesn't feel she will be able to go home and doesn't have anyone that can help  her maneuver in the house.  ----------------------------------------- 11:09 PM on 12/09/2023 ----------------------------------------- Patient still unable to bear weight after additional pain medication.  Labs are essentially unremarkable.  Patient unable to provide urine specimen.  P[employer] requested.  Hospitalist consult ordered for pain control and PT assessment.  ----------------------------------------- 11:29 PM on 12/09/2023 ----------------------------------------- Patient accepted for admission by Dr. Hilma.     FINAL CLINICAL IMPRESSION(S) / ED DIAGNOSES   Final diagnoses:  Fall, initial encounter  Contusion of left hip, initial encounter  Musculoskeletal pain of extremity  Unable to ambulate     Rx / DC Orders   ED Discharge Orders          Ordered    oxyCODONE -acetaminophen  (PERCOCET) 5-325 MG tablet  Every 8 hours PRN        12/09/23 2024    methocarbamol  (ROBAXIN ) 500 MG tablet  Every 8 hours PRN        12/09/23 2024             Note:  This document was prepared using Dragon voice recognition software and may include unintentional dictation errors.   Herlinda Kirk NOVAK, FNP 12/09/23 2034    Herlinda Kirk NOVAK, FNP 12/09/23 2329    Clarine Ozell LABOR, MD 12/11/23 [phone]

## 2023-12-09 NOTE — ED Notes (Signed)
 Attempted to stand patient up to educate on walker use. Pt was unable to bear weight on left foot and left wrist to use the walker and was very unstable on her feet. Pt reported that her left side pain was unbearable. This nurse reported to the provider that she was unsafe and would likely fall again if sent home. Pt returned to stretcher.

## 2023-12-10 ENCOUNTER — Observation Stay

## 2023-12-10 ENCOUNTER — Encounter

## 2023-12-10 DIAGNOSIS — M79609 Pain in unspecified limb: Secondary | ICD-10-CM | POA: Diagnosis not present

## 2023-12-10 DIAGNOSIS — J42 Unspecified chronic bronchitis: Secondary | ICD-10-CM | POA: Diagnosis not present

## 2023-12-10 DIAGNOSIS — F419 Anxiety disorder, unspecified: Secondary | ICD-10-CM | POA: Diagnosis not present

## 2023-12-10 DIAGNOSIS — W19XXXA Unspecified fall, initial encounter: Secondary | ICD-10-CM | POA: Diagnosis not present

## 2023-12-10 DIAGNOSIS — M1812 Unilateral primary osteoarthritis of first carpometacarpal joint, left hand: Secondary | ICD-10-CM | POA: Diagnosis not present

## 2023-12-10 DIAGNOSIS — E876 Hypokalemia: Secondary | ICD-10-CM | POA: Diagnosis not present

## 2023-12-10 DIAGNOSIS — S79912A Unspecified injury of left hip, initial encounter: Secondary | ICD-10-CM | POA: Diagnosis not present

## 2023-12-10 DIAGNOSIS — M19012 Primary osteoarthritis, left shoulder: Secondary | ICD-10-CM | POA: Diagnosis not present

## 2023-12-10 DIAGNOSIS — R609 Edema, unspecified: Secondary | ICD-10-CM | POA: Diagnosis not present

## 2023-12-10 DIAGNOSIS — R262 Difficulty in walking, not elsewhere classified: Secondary | ICD-10-CM | POA: Diagnosis not present

## 2023-12-10 DIAGNOSIS — S0990XA Unspecified injury of head, initial encounter: Secondary | ICD-10-CM | POA: Diagnosis not present

## 2023-12-10 DIAGNOSIS — E785 Hyperlipidemia, unspecified: Secondary | ICD-10-CM | POA: Diagnosis not present

## 2023-12-10 DIAGNOSIS — N1831 Chronic kidney disease, stage 3a: Secondary | ICD-10-CM | POA: Diagnosis not present

## 2023-12-10 DIAGNOSIS — S7002XA Contusion of left hip, initial encounter: Secondary | ICD-10-CM | POA: Diagnosis not present

## 2023-12-10 DIAGNOSIS — I10 Essential (primary) hypertension: Secondary | ICD-10-CM | POA: Diagnosis not present

## 2023-12-10 DIAGNOSIS — M25552 Pain in left hip: Secondary | ICD-10-CM | POA: Diagnosis not present

## 2023-12-10 DIAGNOSIS — Z043 Encounter for examination and observation following other accident: Secondary | ICD-10-CM | POA: Diagnosis not present

## 2023-12-10 DIAGNOSIS — S4992XA Unspecified injury of left shoulder and upper arm, initial encounter: Secondary | ICD-10-CM | POA: Diagnosis not present

## 2023-12-10 DIAGNOSIS — M7582 Other shoulder lesions, left shoulder: Secondary | ICD-10-CM | POA: Diagnosis not present

## 2023-12-10 DIAGNOSIS — Z8673 Personal history of transient ischemic attack (TIA), and cerebral infarction without residual deficits: Secondary | ICD-10-CM | POA: Diagnosis not present

## 2023-12-10 LAB — HIV ANTIBODY (ROUTINE TESTING W REFLEX): HIV Screen 4th Generation wRfx: NONREACTIVE

## 2023-12-10 LAB — URINALYSIS, ROUTINE W REFLEX MICROSCOPIC
Bilirubin Urine: NEGATIVE
Glucose, UA: NEGATIVE mg/dL
Hgb urine dipstick: NEGATIVE
Ketones, ur: NEGATIVE mg/dL
Nitrite: NEGATIVE
Protein, ur: NEGATIVE mg/dL
Specific Gravity, Urine: 1.011 (ref 1.005–1.030)
pH: 7 (ref 5.0–8.0)

## 2023-12-10 LAB — CBC
HCT: 41.3 % (ref 36.0–46.0)
Hemoglobin: 14 g/dL (ref 12.0–15.0)
MCH: 30.9 pg (ref 26.0–34.0)
MCHC: 33.9 g/dL (ref 30.0–36.0)
MCV: 91.2 fL (ref 80.0–100.0)
Platelets: 268 K/uL (ref 150–400)
RBC: 4.53 MIL/uL (ref 3.87–5.11)
RDW: 12.7 % (ref 11.5–15.5)
WBC: 9.4 K/uL (ref 4.0–10.5)
nRBC: 0 % (ref 0.0–0.2)

## 2023-12-10 LAB — BASIC METABOLIC PANEL WITH GFR
Anion gap: 12 (ref 5–15)
BUN: 13 mg/dL (ref 8–23)
CO2: 22 mmol/L (ref 22–32)
Calcium: 9 mg/dL (ref 8.9–10.3)
Chloride: 108 mmol/L (ref 98–111)
Creatinine, Ser: 1.07 mg/dL — ABNORMAL HIGH (ref 0.44–1.00)
GFR, Estimated: 59 mL/min — ABNORMAL LOW (ref >60)
Glucose, Bld: 98 mg/dL (ref 70–99)
Potassium: 3.3 mmol/L — ABNORMAL LOW (ref 3.5–5.1)
Sodium: 142 mmol/L (ref 135–145)

## 2023-12-10 LAB — PHOSPHORUS: Phosphorus: 4.5 mg/dL (ref 2.5–4.6)

## 2023-12-10 LAB — PROTIME-INR
INR: 1 (ref 0.8–1.2)
Prothrombin Time: 13.8 s (ref 11.4–15.2)

## 2023-12-10 LAB — MAGNESIUM: Magnesium: 1.9 mg/dL (ref 1.7–2.4)

## 2023-12-10 LAB — APTT: aPTT: 27 s (ref 24–36)

## 2023-12-10 LAB — BRAIN NATRIURETIC PEPTIDE: B Natriuretic Peptide: 25.2 pg/mL (ref 0.0–100.0)

## 2023-12-10 MED ORDER — ATORVASTATIN CALCIUM 80 MG PO TABS
80.0000 mg | ORAL_TABLET | Freq: Every day | ORAL | Status: DC
  Administered 2023-12-10 – 2023-12-11 (×2): 80 mg via ORAL
  Filled 2023-12-10 (×2): qty 1

## 2023-12-10 MED ORDER — ASPIRIN 81 MG PO TBEC
81.0000 mg | DELAYED_RELEASE_TABLET | Freq: Every day | ORAL | Status: DC
  Administered 2023-12-10 – 2023-12-11 (×2): 81 mg via ORAL
  Filled 2023-12-10 (×2): qty 1

## 2023-12-10 MED ORDER — NITROGLYCERIN 0.4 MG SL SUBL: 0.4000 mg | SUBLINGUAL_TABLET | SUBLINGUAL | Status: DC | PRN

## 2023-12-10 MED ORDER — ROFLUMILAST 250 MCG PO TABS: 250.0000 ug | ORAL_TABLET | Freq: Every day | ORAL | Status: DC

## 2023-12-10 MED ORDER — LOSARTAN POTASSIUM 50 MG PO TABS
100.0000 mg | ORAL_TABLET | Freq: Every day | ORAL | Status: DC
  Administered 2023-12-10 – 2023-12-11 (×2): 100 mg via ORAL
  Filled 2023-12-10 (×2): qty 2

## 2023-12-10 MED ORDER — AMLODIPINE BESYLATE 5 MG PO TABS
5.0000 mg | ORAL_TABLET | Freq: Two times a day (BID) | ORAL | Status: DC
Start: 2023-12-10 — End: 2023-12-11
  Administered 2023-12-10: 5 mg via ORAL
  Filled 2023-12-10 (×3): qty 1

## 2023-12-10 MED ORDER — ALUM & MAG HYDROXIDE-SIMETH 200-200-20 MG/5ML PO SUSP
30.0000 mL | ORAL | Status: DC | PRN
  Administered 2023-12-10: 30 mL via ORAL
  Filled 2023-12-10: qty 30

## 2023-12-10 MED ORDER — LORATADINE 10 MG PO TABS: 10.0000 mg | ORAL_TABLET | Freq: Every day | ORAL | Status: DC | PRN

## 2023-12-10 MED ORDER — POTASSIUM CHLORIDE CRYS ER 20 MEQ PO TBCR
40.0000 meq | EXTENDED_RELEASE_TABLET | Freq: Once | ORAL | Status: AC
  Administered 2023-12-10: 40 meq via ORAL
  Filled 2023-12-10: qty 2

## 2023-12-10 MED ORDER — BUDESON-GLYCOPYRROL-FORMOTEROL 160-9-4.8 MCG/ACT IN AERO
2.0000 | INHALATION_SPRAY | Freq: Two times a day (BID) | RESPIRATORY_TRACT | Status: DC
  Administered 2023-12-10 – 2023-12-11 (×3): 2 via RESPIRATORY_TRACT
  Filled 2023-12-10: qty 5.9

## 2023-12-10 MED ORDER — BUPROPION HCL ER (XL) 150 MG PO TB24
300.0000 mg | ORAL_TABLET | Freq: Every day | ORAL | Status: DC
Start: 2023-12-10 — End: 2023-12-11
  Administered 2023-12-10 – 2023-12-11 (×2): 300 mg via ORAL
  Filled 2023-12-10 (×2): qty 2

## 2023-12-10 MED ORDER — PANTOPRAZOLE SODIUM 40 MG PO TBEC
40.0000 mg | DELAYED_RELEASE_TABLET | Freq: Two times a day (BID) | ORAL | Status: DC
  Administered 2023-12-10 – 2023-12-11 (×4): 40 mg via ORAL
  Filled 2023-12-10 (×4): qty 1

## 2023-12-10 MED ORDER — BUDESONIDE 0.5 MG/2ML IN SUSP: 0.5000 mg | Freq: Every day | RESPIRATORY_TRACT | Status: DC | PRN

## 2023-12-10 MED ORDER — FUROSEMIDE 20 MG PO TABS
10.0000 mg | ORAL_TABLET | Freq: Every day | ORAL | Status: DC
  Administered 2023-12-10 – 2023-12-11 (×2): 10 mg via ORAL
  Filled 2023-12-10 (×2): qty 1

## 2023-12-10 NOTE — Assessment & Plan Note (Signed)
 Blood pressure within goal. - Continue with amlodipine  and Cozaar 

## 2023-12-10 NOTE — Plan of Care (Signed)
  Problem: Education: Goal: Knowledge of General Education information will improve Description: Including pain rating scale, medication(s)/side effects and non-pharmacologic comfort measures Outcome: Progressing   Problem: Clinical Measurements: Goal: Diagnostic test results will improve Outcome: Progressing   Problem: Nutrition: Goal: Adequate nutrition will be maintained Outcome: Progressing   Problem: Coping: Goal: Level of anxiety will decrease Outcome: Progressing   Problem: Pain Managment: Goal: General experience of comfort will improve and/or be controlled Outcome: Progressing

## 2023-12-10 NOTE — Assessment & Plan Note (Signed)
-   Continue home Valium  and Wellbutrin

## 2023-12-10 NOTE — Assessment & Plan Note (Signed)
 Estimated body mass index is 30.23 kg/m as calculated from the following:   Height as of this encounter: 5' 3 (1.6 m).   Weight as of this encounter: 77.4 kg.   - Encouraged weight loss with exercise and healthy diet

## 2023-12-10 NOTE — Assessment & Plan Note (Signed)
 pain in left shoulder, left hip, left hand, left wrist, left knee: All x-rays are negative for any bony fracture, concern of left hip hematoma.  Significant pain and patient was unable to bear any weight on left hip.  Cannot move around left shoulder. PT is recommending SNF but patient does not want to go to any rehab. Ordered MRI to rule out any significant muscular or ligamental injuries. - Continue with pain management and supportive care

## 2023-12-10 NOTE — Evaluation (Signed)
 Physical Therapy Evaluation Patient Details Name: [name] MRN: [id number] DOB: 01-22-1961 Today's Date: 12/10/2023  History of Present Illness  Pt is a 63 y.o. female who presents with fall and L sided pain with all imaging negative for fractures. L hip with hematoma. PMH of stroke, HTN, HLD, COPD, PVD, CAD, MI, dCHF, GERD, depression with anxiety, CKD-3a, granular cell tumor of the esophagus, gastroparesis, carotid artery stenosis.  Clinical Impression  Pt was willing to participate with PT, but frankly was very limited with what she could tolerate, especially when it came to any weight through the L hip (in both sitting and standing positions).  Pt leaning heavily to the R and clearly severely uncomfortable t/o all movement and any position (some relief when supported on the R to allow heavy lean that way in sitting.)   Pt is very far from her baseline, will need continued PT to address functional limitations.      If plan is discharge home, recommend the following: Two people to help with walking and/or transfers;Two people to help with bathing/dressing/bathroom;Assistance with cooking/housework;Assist for transportation;Help with stairs or ramp for entrance   Can travel by private vehicle   No    Equipment Recommendations  (TBD at next venue)  Recommendations for Other Services       Functional Status Assessment Patient has had a recent decline in their functional status and demonstrates the ability to make significant improvements in function in a reasonable and predictable amount of time.     Precautions / Restrictions Precautions Precautions: Fall Restrictions Weight Bearing Restrictions Per Provider Order: No      Mobility  Bed Mobility Overal bed mobility: Needs Assistance Bed Mobility: Rolling, Sidelying to Sit Rolling: Mod assist Sidelying to sit: Max assist       General bed mobility comments: PT showed good effort but pain (especially trying to sit/weight  bear on L hip) was so severe she maintained heavy lean on R elbow    Transfers Overall transfer level: Needs assistance Equipment used: Rolling walker (2 wheels) Transfers: Sit to/from Stand Sit to Stand: Mod assist, Max assist           General transfer comment: heavy assist to attain standing and then very labored and painful heel-toe shuffling trying pivot over to the recliner    Ambulation/Gait Ambulation/Gait assistance:  (unable to try standing 2/2 poor WBing tolerance on L)                Stairs            Wheelchair Mobility     Tilt Bed    Modified Rankin (Stroke Patients Only)       Balance Overall balance assessment: Needs assistance Sitting-balance support: Single extremity supported Sitting balance-Leahy Scale: Fair Sitting balance - Comments: fully unweighting L buttock in sitting, leans on R elbow, unable to maintain upright   Standing balance support: Bilateral upper extremity supported Standing balance-Leahy Scale: Zero Standing balance comment: heavy assist from PT to insure maintainance of standing with walker, pt only minimal shifting weight onto L to heel-toe to recliner - sits as soon as she was able                             Pertinent Vitals/Pain Pain Assessment Pain Assessment: Faces Pain Location: severe pain/sensitivity L hip and buttocks, generally sore in knee, neck and arm Pain Descriptors / Indicators: Sore, Aching    Home  Living Family/patient expects to be discharged to:: Private residence Living Arrangements: Children Available Help at Discharge: Family;Available 24 hours/day Type of Home: House Home Access: Stairs to enter Entrance Stairs-Rails: None Entrance Stairs-Number of Steps: 3   Home Layout: One level Home Equipment: Shower seat - built in;Hand held shower head;Cane - quad      Prior Function Prior Level of Function : Independent/Modified Independent;Driving;Working/employed              Mobility Comments: no AD use, only drives locally, can walk through the grocery store, to MD appts; h/o vertigo and will go slow when she has it ADLs Comments: MOD I/IND with ADLs, IADLs; 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     Extremity/Trunk Assessment   Upper Extremity Assessment Upper Extremity Assessment: Generalized weakness;Right hand dominant;LUE deficits/detail LUE Deficits / Details: prev CVA with L sided deficits, however much exacerbated by pain from fall; limited shoulder ROM, full elbow, wrist and hand ROM although slow and mildly painful; increased pain with pronation    Lower Extremity Assessment Lower Extremity Assessment: Generalized weakness;LLE deficits/detail LLE Deficits / Details: severe pain in L hip/groin with any movement       Communication   Communication Communication: No apparent difficulties    Cognition Arousal: Alert Behavior During Therapy: WFL for tasks assessed/performed                             Following commands: Intact       Cueing       General Comments General comments (skin integrity, edema, etc.): Pt with severe pain with any and all activities involving the L hip    Exercises     Assessment/Plan    PT Assessment Patient needs continued PT services  PT Problem List Decreased strength;Decreased range of motion;Decreased activity tolerance;Decreased balance;Decreased mobility;Decreased knowledge of use of DME;Decreased safety awareness;Pain       PT Treatment Interventions DME instruction;Gait training;Stair training;Functional mobility training;Therapeutic activities;Therapeutic exercise;Balance training;Patient/family education    PT Goals (Current goals can be found in the Care Plan section)  Acute Rehab PT Goals Patient Stated Goal: pt very much wishes to go home, avoid rehab PT Goal Formulation: With patient Time For Goal Achievement: 12/23/23 Potential to Achieve Goals: Fair    Frequency Min  3X/week     Co-evaluation               AM-PAC PT 6 Clicks Mobility  Outcome Measure Help needed turning from your back to your side while in a flat bed without using bedrails?: A Lot Help needed moving from lying on your back to sitting on the side of a flat bed without using bedrails?: Total Help needed moving to and from a bed to a chair (including a wheelchair)?: Total Help needed standing up from a chair using your arms (e.g., wheelchair or bedside chair)?: Total Help needed to walk in hospital room?: Total Help needed climbing 3-5 steps with a railing? : Total 6 Click Score: 7    End of Session Equipment Utilized During Treatment: Gait belt Activity Tolerance: Patient limited by pain Patient left: with chair alarm set;with call bell/phone within reach;with nursing/sitter in room Nurse Communication: Mobility status;Precautions PT Visit Diagnosis: Muscle weakness (generalized) (M62.81);Difficulty in walking, not elsewhere classified (R26.2);Pain Pain - Right/Left: Left Pain - part of body: Hip    Time: 8991-8963 PT Time Calculation (min) (ACUTE ONLY): 28 min   Charges:  PT Evaluation $PT Eval Low Complexity: 1 Low PT Treatments $Therapeutic Activity: 23-37 mins PT General Charges $$ ACUTE PT VISIT: 1 Visit         Carmin JONELLE Deed, DPT 12/10/2023, 1:35 PM

## 2023-12-10 NOTE — Assessment & Plan Note (Signed)
Potassium of 3.3 with normal magnesium. -Replete potassium and monitor

## 2023-12-10 NOTE — Assessment & Plan Note (Signed)
 PT and OT are recommending SNF but patient does not want to go to any facility. Continue with PT and likely-be discharged home with home health in 1 to 2 days. - Fall precautions

## 2023-12-10 NOTE — Assessment & Plan Note (Signed)
 Renal functions at baseline. - Monitor renal function. -Avoid nephrotoxins

## 2023-12-10 NOTE — Evaluation (Signed)
 Occupational Therapy Evaluation Patient Details Name: [name] MRN: [id number] DOB: [birth year] Today's Date: 12/10/2023   History of Present Illness   Pt is a 63 y.o. female who presents with fall and L sided pain with all imaging negative for fractures. L hip with hematoma. PMH of stroke, HTN, HLD, COPD, PVD, CAD, MI, dCHF, GERD, depression with anxiety, CKD-3a, granular cell tumor of the esophagus, gastroparesis, carotid artery stenosis.     Clinical Impressions Pt was seen for OT evaluation this date. PTA, pt resides in a one level home with 3 STE. Reports her son lives with her. She was ambulating without AD at baseline and able to perform ADLs/IADLs and drive with IND/mod I. Pt presents with deficits in pain management, strength, balance, and activity tolerance, affecting safe and optimal ADL completion. Pt currently requires Max A to reach EOB however tolerated ~1 min before needing to return to supine. She had a significant R lateral lean in order to off load weight from her L hip while sitting. Reports 8-10/10 pain in her L hip and stinging pain to her buttocks with all movement. LUE shoulder ROM very limited by pain as well, able to fully range her elbow, wrist and hand but reports pain with pronation. Anticipate Max A for all LB ADL management and Min A for UB ADLs d/t pain.Pt would benefit from skilled OT services to address noted impairments and functional limitations to maximize safety and independence while minimizing future risk of falls, injury, and readmission. Do anticipate the need for follow up OT services upon acute hospital DC.      If plan is discharge home, recommend the following:   A lot of help with bathing/dressing/bathroom;A lot of help with walking and/or transfers;Two people to help with walking and/or transfers;Assistance with cooking/housework;Help with stairs or ramp for entrance     Functional Status Assessment   Patient has had a recent decline in  their functional status and demonstrates the ability to make significant improvements in function in a reasonable and predictable amount of time.     Equipment Recommendations   Other (comment) (defer to next venue)     Recommendations for Other Services         Precautions/Restrictions   Precautions Precautions: Fall Recall of Precautions/Restrictions: Intact Restrictions Weight Bearing Restrictions Per Provider Order: No     Mobility Bed Mobility Overal bed mobility: Needs Assistance Bed Mobility: Rolling, Sidelying to Sit Rolling: Mod assist Sidelying to sit: Max assist       General bed mobility comments: unable to tolerate full sitting upright, fully leaning into R hip and elbow at EOB and would not put weight through her L side d/t pain    Transfers                          Balance Overall balance assessment: Needs assistance Sitting-balance support: Single extremity supported Sitting balance-Leahy Scale: Fair Sitting balance - Comments: would not put weight through L hip d/t pain; leaning significantly on R elbow                                   ADL either performed or assessed with clinical judgement   ADL Overall ADL's : Needs assistance/impaired                 Upper Body Dressing : Moderate assistance Upper Body Dressing Details (  indicate cue type and reason): simulated; painful to touch in LUE                         Vision         Perception         Praxis         Pertinent Vitals/Pain Pain Assessment Pain Assessment: 0-10 Pain Score: 8  Pain Location: L hip and buttocks, generally sore in knee, neck and arm Pain Descriptors / Indicators: Sore, Aching Pain Intervention(s): Monitored during session, Premedicated before session, Repositioned     Extremity/Trunk Assessment Upper Extremity Assessment Upper Extremity Assessment: Generalized weakness;Right hand dominant;LUE  deficits/detail LUE Deficits / Details: prev CVA with L sided deficits, however much exacerbated by pain from fall; limited shoulder ROM, full elbow, wrist and hand ROM although slow and mildly painful; increased pain with pronation   Lower Extremity Assessment Lower Extremity Assessment: Generalized weakness;LLE deficits/detail LLE Deficits / Details: severe pain in L hip/groin with any movement       Communication Communication Communication: No apparent difficulties   Cognition Arousal: Alert Behavior During Therapy: WFL for tasks assessed/performed Cognition: No apparent impairments                               Following commands: Intact       Cueing  General Comments      L UE and hip pain limiting all activity   Exercises Other Exercises Other Exercises: Edu on role of OT in acute setting.   Shoulder Instructions      Home Living Family/patient expects to be discharged to:: Private residence Living Arrangements: Children Available Help at Discharge: Family;Available 24 hours/day Type of Home: House Home Access: Stairs to enter [employer] of Steps: 3 Entrance Stairs-Rails: None Home Layout: One level     Bathroom Shower/Tub: Producer, television/film/video: Standard     Home Equipment: Shower seat - built in;Hand held Engineer, civil (consulting) - quad          Prior Functioning/Environment Prior Level of Function : Independent/Modified Independent;Driving;Working/employed             Mobility Comments: no AD use, only drives locally, can walk through the grocery store, to MD appts; h/o vertigo and will go slow when she has it ADLs Comments: MOD I/IND with ADLs, IADLs; 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    OT Problem List: Decreased strength;Decreased activity tolerance;Impaired balance (sitting and/or standing);Pain   OT Treatment/Interventions: Self-care/ADL training;Therapeutic exercise;Therapeutic  activities;Patient/family education;Balance training      OT Goals(Current goals can be found in the care plan section)   Acute Rehab OT Goals Patient Stated Goal: improve pain and function OT Goal Formulation: With patient Time For Goal Achievement: 12/24/23 Potential to Achieve Goals: Fair ADL Goals Pt Will Perform Lower Body Bathing: sitting/lateral leans;sit to/from stand;with contact guard assist Pt Will Perform Upper Body Dressing: with supervision;sitting Pt Will Perform Lower Body Dressing: with contact guard assist;sitting/lateral leans;sit to/from stand Pt Will Transfer to Toilet: bedside commode;stand pivot transfer;with contact guard assist   OT Frequency:  Min 2X/week    Co-evaluation              AM-PAC OT 6 Clicks Daily Activity     Outcome Measure Help from another person eating meals?: None Help from another person taking care of personal grooming?: A Little Help  from another person toileting, which includes using toliet, bedpan, or urinal?: A Lot Help from another person bathing (including washing, rinsing, drying)?: A Lot Help from another person to put on and taking off regular upper body clothing?: A Lot Help from another person to put on and taking off regular lower body clothing?: A Lot 6 Click Score: 15   End of Session Nurse Communication: Mobility status  Activity Tolerance: Patient limited by pain Patient left: in bed;with call bell/phone within reach;with bed alarm set  OT Visit Diagnosis: Other abnormalities of gait and mobility (R26.89);Muscle weakness (generalized) (M62.81);Pain Pain - Right/Left: Left Pain - part of body: Shoulder;Hip                Time: 9165-9093 OT Time Calculation (min): 32 min Charges:  OT General Charges $OT Visit: 1 Visit OT Evaluation $OT Eval Moderate Complexity: 1 Mod OT Treatments $Self Care/Home Management : 8-22 mins Masin Shatto, OTR/L 12/10/23, 2:10 PM  Ishmel Acevedo E Necole Minassian 12/10/2023, 2:06 PM

## 2023-12-10 NOTE — Assessment & Plan Note (Signed)
 Continue with core

## 2023-12-10 NOTE — ED Notes (Signed)
 Lab notified pt was enroute to room 150 when the phone call was made to the ED RN that a blue top was needed and that lab will have to draw the blue top.

## 2023-12-10 NOTE — Assessment & Plan Note (Signed)
 Seems stable. -Continue with as needed bronchodilators

## 2023-12-10 NOTE — Assessment & Plan Note (Signed)
-   Continue aspirin  and Lipitor  -Holding Plavix  due to hematoma

## 2023-12-10 NOTE — Progress Notes (Signed)
 Progress Note   Patient: [name] FMW:[id number] DOB: 11-12-1960 DOA: 12/09/2023     0 DOS: the patient was seen and examined on 12/10/2023   Brief hospital course: Partly taken from H&P.   ARTIS BUECHELE is a 63 y.o. female with medical history significant of stroke, HTN, HLD, COPD, PVD, CAD, MI, dCHF, GERD, depression with anxiety, CKD-3a, granular cell tumor of the esophagus, gastroparesis, carotid artery stenosis, who presents with fall.  She injured her left knee, left hip, and left hand and left wrist causing pain in all these areas.  On presentation patient has elevated blood pressure at 176/104, initial mild tachycardia which improved, labs with WBC 10.1, potassium 3.4, renal function close to baseline.  X-ray of L-spine, left shoulder, left hip/pelvis, left hand, left wrist, left knee all negative for acute bony fracture.  CT of head negative for acute intracranial abnormalities. CT of C-spine negative for acute injury, but showed degenerative disc disease.  CT of left hip is negative for acute bony fracture, but showed hematoma.  EKG with NSR, QTc 467,occasional PVC, early R wave progression, mild ST depression in the inferior leads and V3-V6.   8/21: Vital stable, UA with trace leukocytes and rare bacteria-no urinary symptoms.  Potassium of 3.3 and creatinine of 1.07 which is around baseline-replating potassium, magnesium  was 1.9 yesterday.  PT and OT    Assessment and Plan: * Left hip pain pain in left shoulder, left hip, left hand, left wrist, left knee: All x-rays are negative for any bony fracture, concern of left hip hematoma.  Significant pain and patient was unable to bear any weight on left hip.  Cannot move around left shoulder. PT is recommending SNF but patient does not want to go to any rehab. Ordered MRI to rule out any significant muscular or ligamental injuries. - Continue with pain management and supportive care  Fall at home, initial encounter PT and OT are  recommending SNF but patient does not want to go to any facility. Continue with PT and likely-be discharged home with home health in 1 to 2 days. - Fall precautions  History of stroke - Continue aspirin  and Lipitor  -Holding Plavix  due to hematoma  CAD (coronary artery disease) No chest pain. - Continue aspirin  and Lipitor   HLD (hyperlipidemia) - Continue with Lipitor   HTN (hypertension) Blood pressure within goal. - Continue with amlodipine  and Cozaar   Chronic kidney disease, stage 3a (HCC) Renal functions at baseline. - Monitor renal function. -Avoid nephrotoxins  COPD (chronic obstructive pulmonary disease) (HCC) Seems stable. -Continue with as needed bronchodilators  Hypokalemia Potassium of 3.3 with normal magnesium  -Replete potassium and monitor  Anxiety and depression - Continue home Valium  and Wellbutrin   Overweight (BMI 25.0-29.9) Estimated body mass index is 30.23 kg/m as calculated from the following:   Height as of this encounter: 5' 3 (1.6 m).   Weight as of this encounter: 77.4 kg.   - Encouraged weight loss with exercise and healthy diet   Subjective: Patient with significant left-sided pain, unable to bear any weight on left hip, unable to make any movements on left shoulder or wrist.  She was feeling very stiff.  Does not want to go to any facility, requesting to stay in hospital for 1-2 more days so her pain becomes little bearable.  Physical Exam: Vitals:   12/10/23 0110 12/10/23 0500 12/10/23 0511 12/10/23 0844  BP: (!) 165/89  129/83 132/86  Pulse: 85  74 82  Resp: 18  19  Temp: 98 F (36.7 C)  97.7 F (36.5 C) 97.9 F (36.6 C)  TempSrc:    Oral  SpO2: 100%  99% 98%  Weight:  77.4 kg    Height:       General.  Well-developed lady, in no acute distress. Pulmonary.  Lungs clear bilaterally, normal respiratory effort. CV.  Regular rate and rhythm, no JVD, rub or murmur. Abdomen.  Soft, nontender, nondistended, BS positive. CNS.   Alert and oriented .  No focal neurologic deficit. Extremities.  No edema, no cyanosis, pulses intact and symmetrical.  Significant pain on moving left upper and lower extremities Psychiatry.  Judgment and insight appears normal.   Data Reviewed: Prior data reviewed  Family Communication: Discussed with patient  Disposition: Status is: Observation The patient will require care spanning > 2 midnights and should be moved to inpatient because: Severity of illness  Planned Discharge Destination: Home with Home Health  Time spent: 50 minutes  This record has been created using Conservation officer, historic buildings. Errors have been sought and corrected,but may not always be located. Such creation errors do not reflect on the standard of care.   Author: Amaryllis Dare, MD 12/10/2023 2:59 PM  For on call review [email or web address].

## 2023-12-10 NOTE — Assessment & Plan Note (Signed)
 No chest pain.  Continue aspirin and Lipitor

## 2023-12-10 NOTE — Hospital Course (Signed)
 Partly taken from H&P.   [name] is a 63 y.o. female with medical history significant of stroke, HTN, HLD, COPD, PVD, CAD, MI, dCHF, GERD, depression with anxiety, CKD-3a, granular cell tumor of the esophagus, gastroparesis, carotid artery stenosis, who presents with fall.  She injured her left knee, left hip, and left hand and left wrist causing pain in all these areas.  On presentation patient has elevated blood pressure at 176/104, initial mild tachycardia which improved, labs with WBC 10.1, potassium 3.4, renal function close to baseline.  X-ray of L-spine, left shoulder, left hip/pelvis, left hand, left wrist, left knee all negative for acute bony fracture.  CT of head negative for acute intracranial abnormalities. CT of C-spine negative for acute injury, but showed degenerative disc disease.  CT of left hip is negative for acute bony fracture, but showed hematoma.  EKG with NSR, QTc 467,occasional PVC, early R wave progression, mild ST depression in the inferior leads and V3-V6.   8/21: Vital stable, UA with trace leukocytes and rare bacteria-no urinary symptoms.  Potassium of 3.3 and creatinine of 1.07 which is around baseline-replating potassium, magnesium  was 1.9 yesterday.  PT and OT

## 2023-12-11 DIAGNOSIS — R262 Difficulty in walking, not elsewhere classified: Secondary | ICD-10-CM | POA: Diagnosis not present

## 2023-12-11 DIAGNOSIS — J42 Unspecified chronic bronchitis: Secondary | ICD-10-CM | POA: Diagnosis not present

## 2023-12-11 DIAGNOSIS — F419 Anxiety disorder, unspecified: Secondary | ICD-10-CM | POA: Diagnosis not present

## 2023-12-11 DIAGNOSIS — M79609 Pain in unspecified limb: Secondary | ICD-10-CM | POA: Diagnosis not present

## 2023-12-11 DIAGNOSIS — M25552 Pain in left hip: Secondary | ICD-10-CM | POA: Diagnosis not present

## 2023-12-11 DIAGNOSIS — Z7401 Bed confinement status: Secondary | ICD-10-CM | POA: Diagnosis not present

## 2023-12-11 DIAGNOSIS — N1831 Chronic kidney disease, stage 3a: Secondary | ICD-10-CM | POA: Diagnosis not present

## 2023-12-11 DIAGNOSIS — N183 Chronic kidney disease, stage 3 unspecified: Secondary | ICD-10-CM | POA: Diagnosis not present

## 2023-12-11 DIAGNOSIS — E785 Hyperlipidemia, unspecified: Secondary | ICD-10-CM | POA: Diagnosis not present

## 2023-12-11 DIAGNOSIS — G4733 Obstructive sleep apnea (adult) (pediatric): Secondary | ICD-10-CM | POA: Diagnosis not present

## 2023-12-11 DIAGNOSIS — E876 Hypokalemia: Secondary | ICD-10-CM | POA: Diagnosis not present

## 2023-12-11 DIAGNOSIS — I10 Essential (primary) hypertension: Secondary | ICD-10-CM | POA: Diagnosis not present

## 2023-12-11 DIAGNOSIS — Z743 Need for continuous supervision: Secondary | ICD-10-CM | POA: Diagnosis not present

## 2023-12-11 DIAGNOSIS — W19XXXA Unspecified fall, initial encounter: Secondary | ICD-10-CM | POA: Diagnosis not present

## 2023-12-11 DIAGNOSIS — S7002XA Contusion of left hip, initial encounter: Secondary | ICD-10-CM | POA: Diagnosis not present

## 2023-12-11 DIAGNOSIS — Z8673 Personal history of transient ischemic attack (TIA), and cerebral infarction without residual deficits: Secondary | ICD-10-CM | POA: Diagnosis not present

## 2023-12-11 LAB — BASIC METABOLIC PANEL WITH GFR
Anion gap: 8 (ref 5–15)
BUN: 12 mg/dL (ref 8–23)
CO2: 25 mmol/L (ref 22–32)
Calcium: 8.9 mg/dL (ref 8.9–10.3)
Chloride: 107 mmol/L (ref 98–111)
Creatinine, Ser: 1.03 mg/dL — ABNORMAL HIGH (ref 0.44–1.00)
GFR, Estimated: >60 mL/min (ref >60)
Glucose, Bld: 104 mg/dL — ABNORMAL HIGH (ref 70–99)
Potassium: 4.1 mmol/L (ref 3.5–5.1)
Sodium: 140 mmol/L (ref 135–145)

## 2023-12-11 MED ORDER — DICLOFENAC SODIUM 1 % EX GEL
4.0000 g | Freq: Four times a day (QID) | CUTANEOUS | Status: DC
  Administered 2023-12-11: 4 g via TOPICAL
  Filled 2023-12-11: qty 100

## 2023-12-11 MED ORDER — DICLOFENAC SODIUM 1 % EX GEL: 4.0000 g | Freq: Four times a day (QID) | CUTANEOUS | 1 refills | Status: DC

## 2023-12-11 MED ORDER — LIDOCAINE 5 % EX PTCH: 1.0000 | MEDICATED_PATCH | Freq: Every day | CUTANEOUS | 0 refills | Status: DC

## 2023-12-11 MED ORDER — OXYCODONE-ACETAMINOPHEN 5-325 MG PO TABS
1.0000 | ORAL_TABLET | Freq: Three times a day (TID) | ORAL | 0 refills | Status: AC | PRN
Start: 2023-12-11 — End: 2024-12-10

## 2023-12-11 MED ORDER — METHOCARBAMOL 500 MG PO TABS: 500.0000 mg | ORAL_TABLET | Freq: Three times a day (TID) | ORAL | 0 refills | Status: AC | PRN

## 2023-12-11 NOTE — TOC Transition Note (Addendum)
 Transition of Care [facility]) - Discharge Note   Patient Details  Name: [name] MRN: [id number] Date of Birth: 05/05/1960  [facility] CM/SW Contact:  [place] L Sharlie Shreffler, LCSW Phone Number: 12/11/2023, 1:08 PM   Clinical Narrative:     Patient is stable for discharge. DME ordered through [facility]. Home Health set up with [facility].   Patients son, Rankin, was contacted and provided updates via voicemail. Transportation set up with [facility].   No further TOC needs. TOC signing off.   1:44pm: BSC ordered. Patient changed their mind. [facility] notified and advised that DME needed to be delivered at home.   Final next level of care: Home w Home Health Services Barriers to Discharge: No Barriers Identified   Patient Goals and CMS Choice     Choice offered to / list presented to : Patient [place] ownership interest in [facility].provided to:: Patient    Discharge Placement                       Discharge Plan and Services Additional resources added to the After Visit Summary for                    DME Agency: [employer] Date DME Agency Contacted: 12/11/23 Time DME Agency Contacted: [phone] Representative spoke with at DME Agency: [facility] HH Arranged: OT, PT HH Agency: [facility] Date HH Agency Contacted: 12/11/23 Time HH Agency Contacted: [phone]    Social Drivers of Health (SDOH) Interventions SDOH Screenings   Food Insecurity: No Food Insecurity (12/10/2023)  Housing: Low Risk  (12/10/2023)  Transportation Needs: No Transportation Needs (12/10/2023)  Utilities: Not At Risk (12/10/2023)  Depression (PHQ2-9): Medium Risk (09/17/2023)  Financial Resource Strain: Patient Declined (04/01/2023)  Physical Activity: Unknown (04/01/2023)  Social Connections: Socially Isolated (12/10/2023)  Stress: Patient Declined (04/01/2023)  Tobacco Use: Medium Risk (12/09/2023)     Readmission Risk Interventions     No data to  display

## 2023-12-11 NOTE — Progress Notes (Signed)
 Physical Therapy Treatment Patient Details Name: [name] MRN: [id number] DOB: 08-14-1960 Today's Date: 12/11/2023   History of Present Illness Pt is a 63 y.o. female who presents with fall and L sided pain with all imaging negative for fractures. L hip with hematoma. PMH of stroke, HTN, HLD, COPD, PVD, CAD, MI, dCHF, GERD, depression with anxiety, CKD-3a, granular cell tumor of the esophagus, gastroparesis, carotid artery stenosis.    PT Comments  Pt was long sitting in bed upon arrival. Pt c/o 8/10 LLE pain but was agreeable to session. Pt endorses feeling a little better than yesterday but still overall pain limited. Pt required assistance to exit bed, stand to RW, and to tolerate limited gait distance. She endorses not wanting to DC to STR. Pt has stairs to enter home and dino voiced concerns with her ability to get in/out of the house. Pt agreeable to EMS transport If Dcing home today. Author recommends continued skilled PT at DC to maximize independence and safety with all ADLs.    If plan is discharge home, recommend the following: A lot of help with walking and/or transfers;A lot of help with bathing/dressing/bathroom;Assistance with cooking/housework;Direct supervision/assist for medications management;Direct supervision/assist for financial management;Assist for transportation;Help with stairs or ramp for entrance   Can travel by private vehicle     No  Equipment Recommendations  Rolling walker (2 wheels);BSC/3in1       Precautions / Restrictions Precautions Precautions: Fall Recall of Precautions/Restrictions: Intact Restrictions Weight Bearing Restrictions Per Provider Order: No     Mobility  Bed Mobility Overal bed mobility: Needs Assistance Bed Mobility: Rolling, Sidelying to Sit, Supine to Sit Rolling: Min assist, Mod assist Sidelying to sit: Min assist, Mod assist Supine to sit: Mod assist, Max assist     Transfers Overall transfer level: Needs  assistance Equipment used: Rolling walker (2 wheels) Transfers: Sit to/from Stand Sit to Stand: Min assist, From elevated surface  General transfer comment: Min assist from elevated bed height with min assist + moderate Vcing    Ambulation/Gait Ambulation/Gait assistance: Min assist Gait Distance (Feet): 15 Feet Assistive device: Rolling walker (2 wheels) Gait Pattern/deviations: Step-to pattern, Antalgic Gait velocity: decreased  General Gait Details: Pt was able to ambulate to doorway of room and back. Vcs for improved posture and sequencing.   Stairs Stairs: Yes  General stair comments: Pt has stairs to enter hoem however due to pain and limited activity tolerance, requested not to perform. Pt requested EMS transport TOC made aware.    Balance Overall balance assessment: Needs assistance Sitting-balance support: Bilateral upper extremity supported Sitting balance-Leahy Scale: Good     Standing balance support: Bilateral upper extremity supported, During functional activity, Reliant on assistive device for balance Standing balance-Leahy Scale: Fair Standing balance comment: reliant on RW for all standing activity      Communication Communication Communication: No apparent difficulties  Cognition Arousal: Alert Behavior During Therapy: WFL for tasks assessed/performed   PT - Cognitive impairments: No family/caregiver present to determine baseline    PT - Cognition Comments: Pt is A and O but endorsingf severe pain in LLE Following commands: Intact      Cueing Cueing Techniques: Verbal cues, Tactile cues         Pertinent Vitals/Pain Pain Assessment Pain Assessment: 0-10 Pain Score: 8  Pain Location: LLE > LUE Pain Descriptors / Indicators: Sore, Aching Pain Intervention(s): Limited activity within patient's tolerance, Premedicated before session, Monitored during session, Repositioned     PT Goals (current goals  can now be found in the care plan section) Acute  Rehab PT Goals Patient Stated Goal: pt very much wishes to go home, avoid rehab Progress towards PT goals: Progressing toward goals    Frequency    Min 3X/week       AM-PAC PT 6 Clicks Mobility   Outcome Measure  Help needed turning from your back to your side while in a flat bed without using bedrails?: A Lot Help needed moving from lying on your back to sitting on the side of a flat bed without using bedrails?: A Lot Help needed moving to and from a bed to a chair (including a wheelchair)?: A Lot Help needed standing up from a chair using your arms (e.g., wheelchair or bedside chair)?: A Lot Help needed to walk in hospital room?: A Lot Help needed climbing 3-5 steps with a railing? : A Lot 6 Click Score: 12    End of Session   Activity Tolerance: Patient limited by pain Patient left: in bed;with call bell/phone within reach;with bed alarm set Nurse Communication: Mobility status;Precautions PT Visit Diagnosis: Muscle weakness (generalized) (M62.81);Difficulty in walking, not elsewhere classified (R26.2);Pain Pain - Right/Left: Left Pain - part of body: Hip     Time: 8860-8842 PT Time Calculation (min) (ACUTE ONLY): 18 min  Charges:    $Therapeutic Activity: 8-22 mins PT General Charges $$ ACUTE PT VISIT: 1 Visit                     Rankin Essex PTA 12/11/23, 1:16 PM

## 2023-12-11 NOTE — TOC Progression Note (Addendum)
 Transition of Care [facility]) - Progression Note    Patient Details  Name: [name] MRN: [id number] Date of Birth: 1961-01-27  Transition of Care [facility]) CM/SW Contact  [place] L Jonetta Dagley, [place] Phone Number: 12/11/2023, 11:39 AM  Clinical Narrative:     CSW met with patient at bedside. Patient reported that she was not feeling well. She advised that she is sitting up in a chair for the first time and she is a bit nauseated. She stated that she wanted to work with PT one more time before discharging home.   CSW and patient discussed home health recommendations. Patient advised that she does not have an agency she is working with but she stated that if [facility] can accept her insurance she will go with them. She stated that if not, she is agreeable to CSW choosing an agency.   Secure chat received advising that RW needed. CSW verified that an order was entered. CSW inquired about patient choice. Patient agreeable to using [facility].   RW order placed with [facility]. DME will be delivered to the room.   CSW spoke with Shaun, [facility], who confirmed that he can accept patient for PT/ OT.                      Expected Discharge Plan and Services         Expected Discharge Date: 12/11/23                                     Social Drivers of Health (SDOH) Interventions SDOH Screenings   Food Insecurity: No Food Insecurity (12/10/2023)  Housing: Low Risk  (12/10/2023)  Transportation Needs: No Transportation Needs (12/10/2023)  Utilities: Not At Risk (12/10/2023)  Depression (PHQ2-9): Medium Risk (09/17/2023)  Financial Resource Strain: Patient Declined (04/01/2023)  Physical Activity: Unknown (04/01/2023)  Social Connections: Socially Isolated (12/10/2023)  Stress: Patient Declined (04/01/2023)  Tobacco Use: Medium Risk (12/09/2023)    Readmission Risk Interventions     No data to display

## 2023-12-11 NOTE — Discharge Summary (Signed)
 Physician Discharge Summary   Patient: [name] MRN: [id number] DOB: June 06, 1960  Admit date:     12/09/2023  Discharge date: 12/11/23  Discharge Physician: Amaryllis Dare   PCP: Vincente Saber, NP   Recommendations at discharge:  Please obtain CBC and BMP on follow-up Follow-up with primary care provider  Discharge Diagnoses: Principal Problem:   Left hip pain Active Problems:   Fall   History of stroke   CAD (coronary artery disease)   HLD (hyperlipidemia)   HTN (hypertension)   Chronic kidney disease, stage 3a (HCC)   COPD (chronic obstructive pulmonary disease) (HCC)   Hypokalemia   Anxiety and depression   Overweight (BMI 25.0-29.9)   Musculoskeletal pain of extremity   Contusion of left hip   Unable to ambulate   Hospital Course: Partly taken from H&P.   [name] is a 63 y.o. female with medical history significant of stroke, HTN, HLD, COPD, PVD, CAD, MI, dCHF, GERD, depression with anxiety, CKD-3a, granular cell tumor of the esophagus, gastroparesis, carotid artery stenosis, who presents with fall.  She injured her left knee, left hip, and left hand and left wrist causing pain in all these areas.  On presentation patient has elevated blood pressure at 176/104, initial mild tachycardia which improved, labs with WBC 10.1, potassium 3.4, renal function close to baseline.  X-ray of L-spine, left shoulder, left hip/pelvis, left hand, left wrist, left knee all negative for acute bony fracture.  CT of head negative for acute intracranial abnormalities. CT of C-spine negative for acute injury, but showed degenerative disc disease.  CT of left hip is negative for acute bony fracture, but showed hematoma.  EKG with NSR, QTc 467,occasional PVC, early R wave progression, mild ST depression in the inferior leads and V3-V6.   8/21: Vital stable, UA with trace leukocytes and rare bacteria-no urinary symptoms.  Potassium of 3.3 and creatinine of 1.07 which is around  baseline-replating potassium, magnesium  was 1.9   8/22: Labs and vitals stable.  MRI of left shoulder and left hip was negative for any ligamental, muscular or tendon tears.  Mild generalized edema due to recent injury.  Hemoglobin stable.  Slowly improving pain and stiffness which is normal for this type of injuries.  Our physical therapist initially recommended SNF but patient wants to go home with home health which was ordered.  Patient was given muscle relaxant, pain medications along with lidocaine  patch and Voltaren  gel.  She was expected to slowly recovered and increased her activity as tolerated.  No weightbearing restrictions.  Patient will continue with her home medications and need to have a close follow-up with her providers for further assistance.  Assessment and Plan: * Left hip pain pain in left shoulder, left hip, left hand, left wrist, left knee: All x-rays are negative for any bony fracture, concern of left hip hematoma.  Significant pain and patient was unable to bear any weight on left hip.  Cannot move around left shoulder. PT is recommending SNF but patient does not want to go to any rehab. MRI of left shoulder and left hip was negative for any significant muscular injury.  No bony injuries noted. - Continue with pain management and supportive care  Fall PT and OT are recommending SNF but patient does not want to go to any facility. Continue with PT and likely-be discharged home with home health   History of stroke - Continue aspirin  and Lipitor  - We held Plavix  during hospitalization and she can resume on  discharge as hemoglobin remained stable  CAD (coronary artery disease) No chest pain. - Continue aspirin  and Lipitor   HLD (hyperlipidemia) - Continue with Lipitor   HTN (hypertension) Blood pressure within goal. - Continue with amlodipine  and Cozaar   Chronic kidney disease, stage 3a (HCC) Renal functions at baseline. - Monitor renal function. -Avoid  nephrotoxins  COPD (chronic obstructive pulmonary disease) (HCC) Seems stable. -Continue with as needed bronchodilators  Hypokalemia Potassium of 3.3 with normal magnesium  -Replete potassium and monitor  Anxiety and depression - Continue home Valium  and Wellbutrin   Overweight (BMI 25.0-29.9) Estimated body mass index is 30.23 kg/m as calculated from the following:   Height as of this encounter: 5' 3 (1.6 m).   Weight as of this encounter: 77.4 kg.   - Encouraged weight loss with exercise and healthy diet  Pain control - [place]  Controlled Substance Reporting System database was reviewed. and patient was instructed, not to drive, operate heavy machinery, perform activities at heights, swimming or participation in water  activities or provide baby-sitting services while on Pain, Sleep and Anxiety Medications; until their outpatient Physician has advised to do so again. Also recommended to not to take more than prescribed Pain, Sleep and Anxiety Medications.  Consultants: None Procedures performed: None Disposition: Home health Diet recommendation:  Discharge Diet Orders (From admission, onward)     Start     Ordered   12/11/23 0000  Diet - low sodium heart healthy        12/11/23 1059           Cardiac diet DISCHARGE MEDICATION: Allergies as of 12/11/2023       Reactions   Gabapentin    Dizziness, Confusion    Pregabalin Other (See Comments)   'bad reaction' hallucinations and acting crazy after taking Lyrica        Medication List     TAKE these medications    acetaminophen  500 MG tablet Commonly known as: TYLENOL  Take 1,000 mg by mouth every 6 (six) hours as needed for moderate pain (pain score 4-6).   albuterol  108 (90 Base) MCG/ACT inhaler Commonly known as: VENTOLIN  HFA INHALE 2 PUFFS BY MOUTH EVERY 6 HOURS AS NEEDED FOR WHEEZING OR SHORTNESS OF BREATH   amLODipine  5 MG tablet Commonly known as: NORVASC  Take 1 tablet (5 mg total) by mouth 2  (two) times daily.   aspirin  EC 81 MG tablet Take 1 tablet (81 mg total) by mouth daily.   atorvastatin  80 MG tablet Commonly known as: LIPITOR  Take 1 tablet (80 mg total) by mouth daily.   budesonide  0.5 MG/2ML nebulizer solution Commonly known as: PULMICORT  Take 2 mLs (0.5 mg total) by nebulization daily as needed (asthma).   buPROPion  300 MG 24 hr tablet Commonly known as: WELLBUTRIN  XL Take 1 tablet (300 mg total) by mouth daily.   clopidogrel  75 MG tablet Commonly known as: PLAVIX  Take 1 tablet (75 mg total) by mouth daily.   diazepam  5 MG tablet Commonly known as: VALIUM  TAKE 1 TABLET BY MOUTH EVERY 12 HOURS AS NEEDED FOR ANXIETY   diclofenac  Sodium 1 % Gel Commonly known as: VOLTAREN  Apply 4 g topically 4 (four) times daily.   fluticasone  50 MCG/ACT nasal spray Commonly known as: FLONASE  Place 1 spray into both nostrils daily.   furosemide  20 MG tablet Commonly known as: LASIX  Take 0.5 tablets (10 mg total) by mouth daily.   ipratropium-albuterol  0.5-2.5 (3) MG/3ML Soln Commonly known as: DUONEB INHALE 1 VIAL VIA NEBULIZER EVERY 6 HOURS AS NEEDED  levalbuterol  1.25 MG/0.5ML nebulizer solution Commonly known as: XOPENEX  Take 1.25 mg by nebulization every 6 (six) hours as needed for wheezing or shortness of breath.   lidocaine  5 % Commonly known as: LIDODERM  Place 1 patch onto the skin at bedtime. Remove & Discard patch within 12 hours or as directed by MD   loratadine  10 MG tablet Commonly known as: CLARITIN  Take 10 mg by mouth daily as needed for allergies.   losartan  100 MG tablet Commonly known as: COZAAR  Take 1 tablet (100 mg total) by mouth daily.   methocarbamol  500 MG tablet Commonly known as: ROBAXIN  Take 1 tablet (500 mg total) by mouth every 8 (eight) hours as needed.   nitroGLYCERIN  0.4 MG SL tablet Commonly known as: NITROSTAT  Place 0.4 mg under the tongue every 5 (five) minutes x 3 doses as needed for chest pain.   ondansetron  4 MG  disintegrating tablet Commonly known as: ZOFRAN -ODT PLACE 1 TABLET BY MOUTH EVERY 8 HOURS AS NEEDED FOR NAUSEA AND/OR VOMITING   oxyCODONE -acetaminophen  5-325 MG tablet Commonly known as: Percocet Take 1 tablet by mouth every 8 (eight) hours as needed for severe pain (pain score 7-10).   pantoprazole  40 MG tablet Commonly known as: PROTONIX  TAKE 1 TABLET BY MOUTH TWICE DAILY BEFORE A MEAL   predniSONE  10 MG tablet Commonly known as: DELTASONE  TAKE 1 TABLET BY MOUTH DAILY WITH BREAKFAST   Roflumilast  250 MCG Tabs Take 250 mcg by mouth daily.   roflumilast  500 MCG Tabs tablet Commonly known as: DALIRESP  Take 1 tablet (500 mcg total) by mouth daily.   Trelegy Ellipta  200-62.5-25 MCG/ACT Aepb Generic drug: Fluticasone -Umeclidin-Vilant Inhale 1 puff into the lungs daily.   UNABLE TO FIND Med Name: weekly allergy shots               Durable Medical Equipment  (From admission, onward)           Start     Ordered   12/11/23 1102  For home use only DME Walker rolling  Once       Question Answer Comment  Walker: With 5 Inch Wheels   Patient needs a walker to treat with the following condition Fall      12/11/23 1101            Follow-up Information     Vincente Saber, NP In 1 week.   Specialties: Nurse Practitioner, Family Medicine Why: If not improving Contact information: [address] [place] [address] [phone]                Discharge Exam: Filed Weights   12/09/23 1646 12/10/23 0500 12/11/23 0259  Weight: 74.4 kg 77.4 kg 77.6 kg   General.  Well-developed lady, in no acute distress. Pulmonary.  Lungs clear bilaterally, normal respiratory effort. CV.  Regular rate and rhythm, no JVD, rub or murmur. Abdomen.  Soft, nontender, nondistended, BS positive. CNS.  Alert and oriented .  No focal neurologic deficit. Extremities.  No edema, no cyanosis, pulses intact and symmetrical. Psychiatry.  Appears  anxious  Condition at discharge: stable  The results of significant diagnostics from this hospitalization (including imaging, microbiology, ancillary and laboratory) are listed below for reference.   Imaging Studies: MR SHOULDER LEFT WO CONTRAST Result Date: 12/10/2023 CLINICAL DATA:  Shoulder trauma, labral tear suspected, xray done EXAM: MRI OF THE LEFT SHOULDER WITHOUT CONTRAST TECHNIQUE: Multiplanar, multisequence MR imaging of the shoulder was performed. No intravenous contrast was administered. COMPARISON:  Radiographs 12/09/2023 and  09/23/2016. FINDINGS: Rotator cuff: Mild supraspinatus tendinosis without evidence of tear. The infraspinatus, teres minor and subscapularis tendons appear normal. Muscles:  No focal rotator cuff muscular atrophy or edema. Biceps long head:  Intact and normally positioned. Acromioclavicular Joint: The acromion is type 2. There are mild-to-moderate acromioclavicular degenerative changes with mild edema in the distal clavicle and mild surrounding soft tissue swelling. No obvious fracture on recent radiographs. There is no significant widening of the acromioclavicular joint. Trace fluid in the subacromial-subdeltoid bursa. Glenohumeral Joint: No significant shoulder joint effusion or glenohumeral arthropathy. Labrum: Labral assessment limited by the lack of joint fluid.No evidence of labral tear or paralabral cyst. Bones: As above, mild edema in the distal clavicle with adjacent soft tissue swelling, probably degenerative. This could reflect a mild AC joint injury. No evidence of acute fracture or dislocation. Other: No other significant soft tissue findings. IMPRESSION: 1. Mild-to-moderate acromioclavicular degenerative changes with mild edema in the distal clavicle and adjacent soft tissue swelling. This could reflect a mild AC joint injury; correlate clinically. No evidence of acute fracture or dislocation. 2. Mild supraspinatus tendinosis without evidence of tear. 3. The  additional components of the rotator cuff, biceps tendon and labrum appear normal. Electronically Signed   By: Elsie Perone M.D.   On: 12/10/2023 16:49   MR HIP LEFT WO CONTRAST Result Date: 12/10/2023 CLINICAL DATA:  Hip trauma, fracture suspected, xray done EXAM: MR OF THE LEFT HIP WITHOUT CONTRAST TECHNIQUE: Multiplanar, multisequence MR imaging was performed. No intravenous contrast was administered. COMPARISON:  Radiographs and CT of the left hip 12/09/2023. FINDINGS: Bones: There is no evidence of acute fracture, dislocation or femoral head osteonecrosis. The visualized bony pelvis appears normal. The visualized sacroiliac joints and symphysis pubis appear normal. Articular cartilage and labrum Articular cartilage: No focal chondral defect or subchondral signal abnormality identified. Labrum: There is no gross labral tear or paralabral abnormality. Joint or bursal effusion Joint effusion: No significant hip joint effusion. Bursae: No focal periarticular fluid collection. Muscles and tendons Muscles and tendons: The visualized gluteus, hamstring and iliopsoas tendons appear normal. No focal muscular atrophy or edema. The piriformis muscles appear symmetric. Other findings Miscellaneous: As seen on recent CT, there is a mildly complex subcutaneous fluid collection inferomedially in the left buttocks, inferior to the left ischium and superficial to gluteus and hamstring musculature. This measures up to 3.1 x 2.5 x 2.2 cm and is most consistent with a hematoma. There is a moderate amount of edema in the surrounding subcutaneous fat which extends anteriorly into the perirectal fat. No perianal fistula identified. No intrapelvic fluid collection or ascites. IMPRESSION: 1. No evidence of acute fracture or dislocation. 2. Mildly complex subcutaneous fluid collection inferomedially in the left buttocks, most consistent with a hematoma. There is surrounding subcutaneous edema which extends anteriorly into the  perirectal fat. These findings are presumably posttraumatic in etiology. Clinical follow-up recommended. 3. The hip joints appear normal. Electronically Signed   By: Elsie Perone M.D.   On: 12/10/2023 16:43   DG Wrist 2 Views Left Result Date: 12/10/2023 CLINICAL DATA:  Fall EXAM: LEFT WRIST - 2 VIEW COMPARISON:  None Available. FINDINGS: No fracture or malalignment. Moderate arthritis at the first [facility] joint. Soft tissues are unremarkable IMPRESSION: No acute osseous abnormality. Electronically Signed   By: Luke Bun M.D.   On: 12/10/2023 00:31   DG Hand 2 View Left Result Date: 12/10/2023 CLINICAL DATA:  Fall EXAM: LEFT HAND - 2 VIEW COMPARISON:  None Available. FINDINGS: No  fracture or malalignment. Moderate arthritis at the first [facility] joint and mild arthritis at the STT interval. Soft tissues are unremarkable IMPRESSION: No acute osseous abnormality. Electronically Signed   By: Luke Bun M.D.   On: 12/10/2023 00:30   DG Knee 1-2 Views Left Result Date: 12/10/2023 CLINICAL DATA:  Fall EXAM: LEFT KNEE - 1-2 VIEW COMPARISON:  None Available. FINDINGS: No evidence of fracture, dislocation, or joint effusion. No evidence of arthropathy or other focal bone abnormality. Soft tissues are unremarkable. IMPRESSION: Negative. Electronically Signed   By: Luke Bun M.D.   On: 12/10/2023 00:30   CT HEAD WO CONTRAST ( ) Result Date: 12/10/2023 CLINICAL DATA:  Head trauma, moderate-severe EXAM: CT HEAD WITHOUT CONTRAST TECHNIQUE: Contiguous axial images were obtained from the base of the skull through the vertex without intravenous contrast. RADIATION DOSE REDUCTION: This exam was performed according to the departmental dose-optimization program which includes automated exposure control, adjustment of the mA and/or kV according to patient size and/or use of iterative reconstruction technique. COMPARISON:  CT head 05/15/2023. FINDINGS: Brain: No evidence of acute infarction, hemorrhage, hydrocephalus,  extra-axial collection or mass lesion/mass effect. Vascular: No hyperdense vessel or unexpected calcification. Skull: Normal. Negative for fracture or focal lesion. Sinuses/Orbits: Mild paranasal sinus mucosal thickening. No acute orbital findings. Other: No mastoid effusions. IMPRESSION: No evidence of acute intracranial abnormality. Electronically Signed   By: Gilmore GORMAN Molt M.D.   On: 12/10/2023 00:17   CT Hip Left Wo Contrast Result Date: 12/09/2023 CLINICAL DATA:  Hip trauma, fracture suspected, xray done.  Fall. EXAM: CT OF THE LEFT HIP WITHOUT CONTRAST TECHNIQUE: Multidetector CT imaging of the left hip was performed according to the standard protocol. Multiplanar CT image reconstructions were also generated. RADIATION DOSE REDUCTION: This exam was performed according to the departmental dose-optimization program which includes automated exposure control, adjustment of the mA and/or kV according to patient size and/or use of iterative reconstruction technique. COMPARISON:  Plain films today. FINDINGS: Bones/Joint/Cartilage No acute bony abnormality. Specifically, no fracture, subluxation, or dislocation. Ligaments Suboptimally assessed by CT. Muscles and Tendons Negative Soft tissues Stranding/hematoma noted in the posterior soft tissues in the medial upper left thigh extending towards the left perineum. IMPRESSION: No acute bony abnormality. Hematoma within the posteromedial subcutaneous soft tissues in the upper left thigh extending towards the left perineum. Electronically Signed   By: Franky Crease M.D.   On: 12/09/2023 20:05   CT Cervical Spine Wo Contrast Result Date: 12/09/2023 CLINICAL DATA:  Neck trauma fall with left-sided pain EXAM: CT CERVICAL SPINE WITHOUT CONTRAST TECHNIQUE: Multidetector CT imaging of the cervical spine was performed without intravenous contrast. Multiplanar CT image reconstructions were also generated. RADIATION DOSE REDUCTION: This exam was performed according to the  departmental dose-optimization program which includes automated exposure control, adjustment of the mA and/or kV according to patient size and/or use of iterative reconstruction technique. COMPARISON:  MRI 11/28/2019 FINDINGS: Alignment: Straightening of the cervical spine. No subluxation. Facet alignment is normal Skull base and vertebrae: No acute fracture. No primary bone lesion or focal pathologic process. Soft tissues and spinal canal: No prevertebral fluid or swelling. No visible canal hematoma. Disc levels: Advanced disc space narrowing C5-C6 with moderate bilateral foraminal narrowing. Mild multilevel facet degenerative changes. No high-grade canal stenosis. Upper chest: Negative. Other: Bilateral carotid vascular calcification. IMPRESSION: Straightening of the cervical spine with degenerative changes. No acute osseous abnormality. Electronically Signed   By: Luke Bun M.D.   On: 12/09/2023 18:51   DG Shoulder Left  Result Date: 12/09/2023 CLINICAL DATA:  Left shoulder pain after fall. EXAM: LEFT SHOULDER - 2+ VIEW COMPARISON:  None Available. FINDINGS: There is no evidence of fracture or dislocation. There is no evidence of arthropathy or other focal bone abnormality. Soft tissues are unremarkable. IMPRESSION: Negative. Electronically Signed   By: Lynwood Landy Raddle M.D.   On: 12/09/2023 17:32   DG Lumbar Spine Complete Result Date: 12/09/2023 CLINICAL DATA:  Low back pain after fall. EXAM: LUMBAR SPINE - COMPLETE 4+ VIEW COMPARISON:  None Available. FINDINGS: There is no evidence of lumbar spine fracture. Alignment is normal. Intervertebral disc spaces are maintained. IMPRESSION: Negative. Electronically Signed   By: Lynwood Landy Raddle M.D.   On: 12/09/2023 17:30   DG Hip Unilat W or Wo Pelvis 2-3 Views Left Result Date: 12/09/2023 CLINICAL DATA:  Left hip pain after fall. EXAM: DG HIP (WITH OR WITHOUT PELVIS) 2-3V LEFT COMPARISON:  None Available. FINDINGS: There is no evidence of hip fracture or  dislocation. There is no evidence of arthropathy or other focal bone abnormality. IMPRESSION: Negative. Electronically Signed   By: Lynwood Landy Raddle M.D.   On: 12/09/2023 17:29    Microbiology: Results for orders placed or performed in visit on 07/31/21  Microscopic Examination     Status: None   Collection Time: 07/31/21  2:57 PM   Urine  Result Value Ref Range Status   WBC, UA 0-5 0 - 5 /hpf Final   RBC, Urine 0-2 0 - 2 /hpf Final   Epithelial Cells (non renal) 0-10 0 - 10 /hpf Final   Bacteria, UA None seen None seen/Few Final    Labs: CBC: Recent Labs  Lab 12/09/23 2229 12/10/23 0117  WBC 10.1 9.4  NEUTROABS 6.6  --   HGB 14.3 14.0  HCT 42.8 41.3  MCV 93.2 91.2  PLT 278 268   Basic Metabolic Panel: Recent Labs  Lab 12/09/23 2229 12/10/23 0117 12/11/23 0512  NA 143 142 140  K 3.4* 3.3* 4.1  CL 106 108 107  CO2 23 22 25   GLUCOSE 103* 98 104*  BUN 14 13 12   CREATININE 1.16* 1.07* 1.03*  CALCIUM  9.2 9.0 8.9  MG 1.9  --   --   PHOS 4.5  --   --    Liver Function Tests: Recent Labs  Lab 12/09/23 2229  AST 25  ALT 32  ALKPHOS 72  BILITOT 0.3  PROT 6.5  ALBUMIN 3.9   CBG: No results for input(s): GLUCAP in the last 168 hours.  Discharge time spent: greater than 30 minutes.  This record has been created using Conservation officer, historic buildings. Errors have been sought and corrected,but may not always be located. Such creation errors do not reflect on the standard of care.   Signed: Amaryllis Dare, MD [facility] 12/11/2023

## 2023-12-11 NOTE — Plan of Care (Signed)
   Problem: Education: Goal: Knowledge of General Education information will improve Description Including pain rating scale, medication(s)/side effects and non-pharmacologic comfort measures Outcome: Progressing   Problem: Health Behavior/Discharge Planning: Goal: Ability to manage health-related needs will improve Outcome: Progressing

## 2023-12-11 NOTE — Progress Notes (Signed)
 Patient is not able to walk the distance required to go the bathroom, or he/she is unable to safely negotiate stairs required to access the bathroom.  A 3in1 BSC will alleviate this problem

## 2023-12-12 DIAGNOSIS — G4733 Obstructive sleep apnea (adult) (pediatric): Secondary | ICD-10-CM | POA: Diagnosis not present

## 2023-12-14 DIAGNOSIS — I69998 Other sequelae following unspecified cerebrovascular disease: Secondary | ICD-10-CM | POA: Diagnosis not present

## 2023-12-15 ENCOUNTER — Encounter

## 2023-12-17 ENCOUNTER — Encounter

## 2023-12-18 DIAGNOSIS — Z9181 History of falling: Secondary | ICD-10-CM | POA: Diagnosis not present

## 2023-12-18 DIAGNOSIS — I130 Hypertensive heart and chronic kidney disease with heart failure and stage 1 through stage 4 chronic kidney disease, or unspecified chronic kidney disease: Secondary | ICD-10-CM | POA: Diagnosis not present

## 2023-12-18 DIAGNOSIS — J449 Chronic obstructive pulmonary disease, unspecified: Secondary | ICD-10-CM | POA: Diagnosis not present

## 2023-12-18 DIAGNOSIS — Z7982 Long term (current) use of aspirin: Secondary | ICD-10-CM | POA: Diagnosis not present

## 2023-12-18 DIAGNOSIS — Z7902 Long term (current) use of antithrombotics/antiplatelets: Secondary | ICD-10-CM | POA: Diagnosis not present

## 2023-12-18 DIAGNOSIS — S7002XD Contusion of left hip, subsequent encounter: Secondary | ICD-10-CM | POA: Diagnosis not present

## 2023-12-18 DIAGNOSIS — Z87891 Personal history of nicotine dependence: Secondary | ICD-10-CM | POA: Diagnosis not present

## 2023-12-18 DIAGNOSIS — F32A Depression, unspecified: Secondary | ICD-10-CM | POA: Diagnosis not present

## 2023-12-18 DIAGNOSIS — M19012 Primary osteoarthritis, left shoulder: Secondary | ICD-10-CM | POA: Diagnosis not present

## 2023-12-18 DIAGNOSIS — M25562 Pain in left knee: Secondary | ICD-10-CM | POA: Diagnosis not present

## 2023-12-18 DIAGNOSIS — M25532 Pain in left wrist: Secondary | ICD-10-CM | POA: Diagnosis not present

## 2023-12-18 DIAGNOSIS — I739 Peripheral vascular disease, unspecified: Secondary | ICD-10-CM | POA: Diagnosis not present

## 2023-12-18 DIAGNOSIS — K3184 Gastroparesis: Secondary | ICD-10-CM | POA: Diagnosis not present

## 2023-12-18 DIAGNOSIS — M25542 Pain in joints of left hand: Secondary | ICD-10-CM | POA: Diagnosis not present

## 2023-12-18 DIAGNOSIS — I5032 Chronic diastolic (congestive) heart failure: Secondary | ICD-10-CM | POA: Diagnosis not present

## 2023-12-18 DIAGNOSIS — E785 Hyperlipidemia, unspecified: Secondary | ICD-10-CM | POA: Diagnosis not present

## 2023-12-18 DIAGNOSIS — K219 Gastro-esophageal reflux disease without esophagitis: Secondary | ICD-10-CM | POA: Diagnosis not present

## 2023-12-18 DIAGNOSIS — I252 Old myocardial infarction: Secondary | ICD-10-CM | POA: Diagnosis not present

## 2023-12-18 DIAGNOSIS — Z8501 Personal history of malignant neoplasm of esophagus: Secondary | ICD-10-CM | POA: Diagnosis not present

## 2023-12-18 DIAGNOSIS — E663 Overweight: Secondary | ICD-10-CM | POA: Diagnosis not present

## 2023-12-18 DIAGNOSIS — F419 Anxiety disorder, unspecified: Secondary | ICD-10-CM | POA: Diagnosis not present

## 2023-12-18 DIAGNOSIS — N1831 Chronic kidney disease, stage 3a: Secondary | ICD-10-CM | POA: Diagnosis not present

## 2023-12-18 DIAGNOSIS — I69354 Hemiplegia and hemiparesis following cerebral infarction affecting left non-dominant side: Secondary | ICD-10-CM | POA: Diagnosis not present

## 2023-12-18 DIAGNOSIS — I2510 Atherosclerotic heart disease of native coronary artery without angina pectoris: Secondary | ICD-10-CM | POA: Diagnosis not present

## 2023-12-18 DIAGNOSIS — M47812 Spondylosis without myelopathy or radiculopathy, cervical region: Secondary | ICD-10-CM | POA: Diagnosis not present

## 2023-12-22 ENCOUNTER — Ambulatory Visit: Admitting: Internal Medicine

## 2023-12-22 DIAGNOSIS — I739 Peripheral vascular disease, unspecified: Secondary | ICD-10-CM | POA: Diagnosis not present

## 2023-12-22 DIAGNOSIS — I252 Old myocardial infarction: Secondary | ICD-10-CM | POA: Diagnosis not present

## 2023-12-22 DIAGNOSIS — M25562 Pain in left knee: Secondary | ICD-10-CM | POA: Diagnosis not present

## 2023-12-22 DIAGNOSIS — I2510 Atherosclerotic heart disease of native coronary artery without angina pectoris: Secondary | ICD-10-CM | POA: Diagnosis not present

## 2023-12-22 DIAGNOSIS — F32A Depression, unspecified: Secondary | ICD-10-CM | POA: Diagnosis not present

## 2023-12-22 DIAGNOSIS — Z8501 Personal history of malignant neoplasm of esophagus: Secondary | ICD-10-CM | POA: Diagnosis not present

## 2023-12-22 DIAGNOSIS — E663 Overweight: Secondary | ICD-10-CM | POA: Diagnosis not present

## 2023-12-22 DIAGNOSIS — K3184 Gastroparesis: Secondary | ICD-10-CM | POA: Diagnosis not present

## 2023-12-22 DIAGNOSIS — I5032 Chronic diastolic (congestive) heart failure: Secondary | ICD-10-CM | POA: Diagnosis not present

## 2023-12-22 DIAGNOSIS — Z7902 Long term (current) use of antithrombotics/antiplatelets: Secondary | ICD-10-CM | POA: Diagnosis not present

## 2023-12-22 DIAGNOSIS — F419 Anxiety disorder, unspecified: Secondary | ICD-10-CM | POA: Diagnosis not present

## 2023-12-22 DIAGNOSIS — Z7982 Long term (current) use of aspirin: Secondary | ICD-10-CM | POA: Diagnosis not present

## 2023-12-22 DIAGNOSIS — Z87891 Personal history of nicotine dependence: Secondary | ICD-10-CM | POA: Diagnosis not present

## 2023-12-22 DIAGNOSIS — M25542 Pain in joints of left hand: Secondary | ICD-10-CM | POA: Diagnosis not present

## 2023-12-22 DIAGNOSIS — K219 Gastro-esophageal reflux disease without esophagitis: Secondary | ICD-10-CM | POA: Diagnosis not present

## 2023-12-22 DIAGNOSIS — Z9181 History of falling: Secondary | ICD-10-CM | POA: Diagnosis not present

## 2023-12-22 DIAGNOSIS — M19012 Primary osteoarthritis, left shoulder: Secondary | ICD-10-CM | POA: Diagnosis not present

## 2023-12-22 DIAGNOSIS — N1831 Chronic kidney disease, stage 3a: Secondary | ICD-10-CM | POA: Diagnosis not present

## 2023-12-22 DIAGNOSIS — E785 Hyperlipidemia, unspecified: Secondary | ICD-10-CM | POA: Diagnosis not present

## 2023-12-22 DIAGNOSIS — M25532 Pain in left wrist: Secondary | ICD-10-CM | POA: Diagnosis not present

## 2023-12-22 DIAGNOSIS — S7002XD Contusion of left hip, subsequent encounter: Secondary | ICD-10-CM | POA: Diagnosis not present

## 2023-12-22 DIAGNOSIS — J449 Chronic obstructive pulmonary disease, unspecified: Secondary | ICD-10-CM | POA: Diagnosis not present

## 2023-12-22 DIAGNOSIS — I69354 Hemiplegia and hemiparesis following cerebral infarction affecting left non-dominant side: Secondary | ICD-10-CM | POA: Diagnosis not present

## 2023-12-22 DIAGNOSIS — I130 Hypertensive heart and chronic kidney disease with heart failure and stage 1 through stage 4 chronic kidney disease, or unspecified chronic kidney disease: Secondary | ICD-10-CM | POA: Diagnosis not present

## 2023-12-22 DIAGNOSIS — M47812 Spondylosis without myelopathy or radiculopathy, cervical region: Secondary | ICD-10-CM | POA: Diagnosis not present

## 2023-12-25 ENCOUNTER — Inpatient Hospital Stay: Admitting: Nurse Practitioner

## 2023-12-25 DIAGNOSIS — I5032 Chronic diastolic (congestive) heart failure: Secondary | ICD-10-CM | POA: Diagnosis not present

## 2023-12-25 DIAGNOSIS — Z9181 History of falling: Secondary | ICD-10-CM | POA: Diagnosis not present

## 2023-12-25 DIAGNOSIS — E785 Hyperlipidemia, unspecified: Secondary | ICD-10-CM | POA: Diagnosis not present

## 2023-12-25 DIAGNOSIS — M47812 Spondylosis without myelopathy or radiculopathy, cervical region: Secondary | ICD-10-CM | POA: Diagnosis not present

## 2023-12-25 DIAGNOSIS — J449 Chronic obstructive pulmonary disease, unspecified: Secondary | ICD-10-CM | POA: Diagnosis not present

## 2023-12-25 DIAGNOSIS — Z7902 Long term (current) use of antithrombotics/antiplatelets: Secondary | ICD-10-CM | POA: Diagnosis not present

## 2023-12-25 DIAGNOSIS — E663 Overweight: Secondary | ICD-10-CM | POA: Diagnosis not present

## 2023-12-25 DIAGNOSIS — M25532 Pain in left wrist: Secondary | ICD-10-CM | POA: Diagnosis not present

## 2023-12-25 DIAGNOSIS — N1831 Chronic kidney disease, stage 3a: Secondary | ICD-10-CM | POA: Diagnosis not present

## 2023-12-25 DIAGNOSIS — I130 Hypertensive heart and chronic kidney disease with heart failure and stage 1 through stage 4 chronic kidney disease, or unspecified chronic kidney disease: Secondary | ICD-10-CM | POA: Diagnosis not present

## 2023-12-25 DIAGNOSIS — K219 Gastro-esophageal reflux disease without esophagitis: Secondary | ICD-10-CM | POA: Diagnosis not present

## 2023-12-25 DIAGNOSIS — Z7982 Long term (current) use of aspirin: Secondary | ICD-10-CM | POA: Diagnosis not present

## 2023-12-25 DIAGNOSIS — Z8501 Personal history of malignant neoplasm of esophagus: Secondary | ICD-10-CM | POA: Diagnosis not present

## 2023-12-25 DIAGNOSIS — Z87891 Personal history of nicotine dependence: Secondary | ICD-10-CM | POA: Diagnosis not present

## 2023-12-25 DIAGNOSIS — I69354 Hemiplegia and hemiparesis following cerebral infarction affecting left non-dominant side: Secondary | ICD-10-CM | POA: Diagnosis not present

## 2023-12-25 DIAGNOSIS — F32A Depression, unspecified: Secondary | ICD-10-CM | POA: Diagnosis not present

## 2023-12-25 DIAGNOSIS — I739 Peripheral vascular disease, unspecified: Secondary | ICD-10-CM | POA: Diagnosis not present

## 2023-12-25 DIAGNOSIS — M25542 Pain in joints of left hand: Secondary | ICD-10-CM | POA: Diagnosis not present

## 2023-12-25 DIAGNOSIS — K3184 Gastroparesis: Secondary | ICD-10-CM | POA: Diagnosis not present

## 2023-12-25 DIAGNOSIS — S7002XD Contusion of left hip, subsequent encounter: Secondary | ICD-10-CM | POA: Diagnosis not present

## 2023-12-25 DIAGNOSIS — M19012 Primary osteoarthritis, left shoulder: Secondary | ICD-10-CM | POA: Diagnosis not present

## 2023-12-25 DIAGNOSIS — F419 Anxiety disorder, unspecified: Secondary | ICD-10-CM | POA: Diagnosis not present

## 2023-12-25 DIAGNOSIS — M25562 Pain in left knee: Secondary | ICD-10-CM | POA: Diagnosis not present

## 2023-12-25 DIAGNOSIS — I252 Old myocardial infarction: Secondary | ICD-10-CM | POA: Diagnosis not present

## 2023-12-25 DIAGNOSIS — I2510 Atherosclerotic heart disease of native coronary artery without angina pectoris: Secondary | ICD-10-CM | POA: Diagnosis not present

## 2023-12-31 DIAGNOSIS — J449 Chronic obstructive pulmonary disease, unspecified: Secondary | ICD-10-CM | POA: Diagnosis not present

## 2023-12-31 DIAGNOSIS — N1831 Chronic kidney disease, stage 3a: Secondary | ICD-10-CM | POA: Diagnosis not present

## 2023-12-31 DIAGNOSIS — F32A Depression, unspecified: Secondary | ICD-10-CM | POA: Diagnosis not present

## 2023-12-31 DIAGNOSIS — M19012 Primary osteoarthritis, left shoulder: Secondary | ICD-10-CM | POA: Diagnosis not present

## 2023-12-31 DIAGNOSIS — Z87891 Personal history of nicotine dependence: Secondary | ICD-10-CM | POA: Diagnosis not present

## 2023-12-31 DIAGNOSIS — I5032 Chronic diastolic (congestive) heart failure: Secondary | ICD-10-CM | POA: Diagnosis not present

## 2023-12-31 DIAGNOSIS — Z7982 Long term (current) use of aspirin: Secondary | ICD-10-CM | POA: Diagnosis not present

## 2023-12-31 DIAGNOSIS — I739 Peripheral vascular disease, unspecified: Secondary | ICD-10-CM | POA: Diagnosis not present

## 2023-12-31 DIAGNOSIS — I69354 Hemiplegia and hemiparesis following cerebral infarction affecting left non-dominant side: Secondary | ICD-10-CM | POA: Diagnosis not present

## 2023-12-31 DIAGNOSIS — Z9181 History of falling: Secondary | ICD-10-CM | POA: Diagnosis not present

## 2023-12-31 DIAGNOSIS — M25542 Pain in joints of left hand: Secondary | ICD-10-CM | POA: Diagnosis not present

## 2023-12-31 DIAGNOSIS — I130 Hypertensive heart and chronic kidney disease with heart failure and stage 1 through stage 4 chronic kidney disease, or unspecified chronic kidney disease: Secondary | ICD-10-CM | POA: Diagnosis not present

## 2023-12-31 DIAGNOSIS — Z8501 Personal history of malignant neoplasm of esophagus: Secondary | ICD-10-CM | POA: Diagnosis not present

## 2023-12-31 DIAGNOSIS — I252 Old myocardial infarction: Secondary | ICD-10-CM | POA: Diagnosis not present

## 2023-12-31 DIAGNOSIS — S7002XD Contusion of left hip, subsequent encounter: Secondary | ICD-10-CM | POA: Diagnosis not present

## 2023-12-31 DIAGNOSIS — M25532 Pain in left wrist: Secondary | ICD-10-CM | POA: Diagnosis not present

## 2023-12-31 DIAGNOSIS — M25562 Pain in left knee: Secondary | ICD-10-CM | POA: Diagnosis not present

## 2023-12-31 DIAGNOSIS — K219 Gastro-esophageal reflux disease without esophagitis: Secondary | ICD-10-CM | POA: Diagnosis not present

## 2023-12-31 DIAGNOSIS — M47812 Spondylosis without myelopathy or radiculopathy, cervical region: Secondary | ICD-10-CM | POA: Diagnosis not present

## 2023-12-31 DIAGNOSIS — E663 Overweight: Secondary | ICD-10-CM | POA: Diagnosis not present

## 2023-12-31 DIAGNOSIS — I2510 Atherosclerotic heart disease of native coronary artery without angina pectoris: Secondary | ICD-10-CM | POA: Diagnosis not present

## 2023-12-31 DIAGNOSIS — K3184 Gastroparesis: Secondary | ICD-10-CM | POA: Diagnosis not present

## 2023-12-31 DIAGNOSIS — E785 Hyperlipidemia, unspecified: Secondary | ICD-10-CM | POA: Diagnosis not present

## 2023-12-31 DIAGNOSIS — F419 Anxiety disorder, unspecified: Secondary | ICD-10-CM | POA: Diagnosis not present

## 2023-12-31 DIAGNOSIS — Z7902 Long term (current) use of antithrombotics/antiplatelets: Secondary | ICD-10-CM | POA: Diagnosis not present

## 2024-01-01 ENCOUNTER — Inpatient Hospital Stay: Admitting: Nurse Practitioner

## 2024-01-04 DIAGNOSIS — I739 Peripheral vascular disease, unspecified: Secondary | ICD-10-CM | POA: Diagnosis not present

## 2024-01-04 DIAGNOSIS — I69354 Hemiplegia and hemiparesis following cerebral infarction affecting left non-dominant side: Secondary | ICD-10-CM | POA: Diagnosis not present

## 2024-01-04 DIAGNOSIS — Z7902 Long term (current) use of antithrombotics/antiplatelets: Secondary | ICD-10-CM | POA: Diagnosis not present

## 2024-01-04 DIAGNOSIS — S7002XD Contusion of left hip, subsequent encounter: Secondary | ICD-10-CM | POA: Diagnosis not present

## 2024-01-04 DIAGNOSIS — K219 Gastro-esophageal reflux disease without esophagitis: Secondary | ICD-10-CM | POA: Diagnosis not present

## 2024-01-04 DIAGNOSIS — E663 Overweight: Secondary | ICD-10-CM | POA: Diagnosis not present

## 2024-01-04 DIAGNOSIS — M25562 Pain in left knee: Secondary | ICD-10-CM | POA: Diagnosis not present

## 2024-01-04 DIAGNOSIS — F32A Depression, unspecified: Secondary | ICD-10-CM | POA: Diagnosis not present

## 2024-01-04 DIAGNOSIS — I130 Hypertensive heart and chronic kidney disease with heart failure and stage 1 through stage 4 chronic kidney disease, or unspecified chronic kidney disease: Secondary | ICD-10-CM | POA: Diagnosis not present

## 2024-01-04 DIAGNOSIS — N1831 Chronic kidney disease, stage 3a: Secondary | ICD-10-CM | POA: Diagnosis not present

## 2024-01-04 DIAGNOSIS — Z8501 Personal history of malignant neoplasm of esophagus: Secondary | ICD-10-CM | POA: Diagnosis not present

## 2024-01-04 DIAGNOSIS — J449 Chronic obstructive pulmonary disease, unspecified: Secondary | ICD-10-CM | POA: Diagnosis not present

## 2024-01-04 DIAGNOSIS — K3184 Gastroparesis: Secondary | ICD-10-CM | POA: Diagnosis not present

## 2024-01-04 DIAGNOSIS — I252 Old myocardial infarction: Secondary | ICD-10-CM | POA: Diagnosis not present

## 2024-01-04 DIAGNOSIS — I2510 Atherosclerotic heart disease of native coronary artery without angina pectoris: Secondary | ICD-10-CM | POA: Diagnosis not present

## 2024-01-04 DIAGNOSIS — F419 Anxiety disorder, unspecified: Secondary | ICD-10-CM | POA: Diagnosis not present

## 2024-01-04 DIAGNOSIS — E785 Hyperlipidemia, unspecified: Secondary | ICD-10-CM | POA: Diagnosis not present

## 2024-01-04 DIAGNOSIS — Z87891 Personal history of nicotine dependence: Secondary | ICD-10-CM | POA: Diagnosis not present

## 2024-01-04 DIAGNOSIS — M25542 Pain in joints of left hand: Secondary | ICD-10-CM | POA: Diagnosis not present

## 2024-01-04 DIAGNOSIS — Z7982 Long term (current) use of aspirin: Secondary | ICD-10-CM | POA: Diagnosis not present

## 2024-01-04 DIAGNOSIS — I5032 Chronic diastolic (congestive) heart failure: Secondary | ICD-10-CM | POA: Diagnosis not present

## 2024-01-04 DIAGNOSIS — Z9181 History of falling: Secondary | ICD-10-CM | POA: Diagnosis not present

## 2024-01-04 DIAGNOSIS — M25532 Pain in left wrist: Secondary | ICD-10-CM | POA: Diagnosis not present

## 2024-01-04 DIAGNOSIS — M47812 Spondylosis without myelopathy or radiculopathy, cervical region: Secondary | ICD-10-CM | POA: Diagnosis not present

## 2024-01-04 DIAGNOSIS — M19012 Primary osteoarthritis, left shoulder: Secondary | ICD-10-CM | POA: Diagnosis not present

## 2024-01-07 DIAGNOSIS — I252 Old myocardial infarction: Secondary | ICD-10-CM | POA: Diagnosis not present

## 2024-01-07 DIAGNOSIS — I739 Peripheral vascular disease, unspecified: Secondary | ICD-10-CM | POA: Diagnosis not present

## 2024-01-07 DIAGNOSIS — F32A Depression, unspecified: Secondary | ICD-10-CM | POA: Diagnosis not present

## 2024-01-07 DIAGNOSIS — I130 Hypertensive heart and chronic kidney disease with heart failure and stage 1 through stage 4 chronic kidney disease, or unspecified chronic kidney disease: Secondary | ICD-10-CM | POA: Diagnosis not present

## 2024-01-07 DIAGNOSIS — Z7902 Long term (current) use of antithrombotics/antiplatelets: Secondary | ICD-10-CM | POA: Diagnosis not present

## 2024-01-07 DIAGNOSIS — Z87891 Personal history of nicotine dependence: Secondary | ICD-10-CM | POA: Diagnosis not present

## 2024-01-07 DIAGNOSIS — Z7982 Long term (current) use of aspirin: Secondary | ICD-10-CM | POA: Diagnosis not present

## 2024-01-07 DIAGNOSIS — I69354 Hemiplegia and hemiparesis following cerebral infarction affecting left non-dominant side: Secondary | ICD-10-CM | POA: Diagnosis not present

## 2024-01-07 DIAGNOSIS — I2510 Atherosclerotic heart disease of native coronary artery without angina pectoris: Secondary | ICD-10-CM | POA: Diagnosis not present

## 2024-01-07 DIAGNOSIS — E663 Overweight: Secondary | ICD-10-CM | POA: Diagnosis not present

## 2024-01-07 DIAGNOSIS — K219 Gastro-esophageal reflux disease without esophagitis: Secondary | ICD-10-CM | POA: Diagnosis not present

## 2024-01-07 DIAGNOSIS — M19012 Primary osteoarthritis, left shoulder: Secondary | ICD-10-CM | POA: Diagnosis not present

## 2024-01-07 DIAGNOSIS — N1831 Chronic kidney disease, stage 3a: Secondary | ICD-10-CM | POA: Diagnosis not present

## 2024-01-07 DIAGNOSIS — M25542 Pain in joints of left hand: Secondary | ICD-10-CM | POA: Diagnosis not present

## 2024-01-07 DIAGNOSIS — S7002XD Contusion of left hip, subsequent encounter: Secondary | ICD-10-CM | POA: Diagnosis not present

## 2024-01-07 DIAGNOSIS — Z9181 History of falling: Secondary | ICD-10-CM | POA: Diagnosis not present

## 2024-01-07 DIAGNOSIS — J449 Chronic obstructive pulmonary disease, unspecified: Secondary | ICD-10-CM | POA: Diagnosis not present

## 2024-01-07 DIAGNOSIS — F419 Anxiety disorder, unspecified: Secondary | ICD-10-CM | POA: Diagnosis not present

## 2024-01-07 DIAGNOSIS — I5032 Chronic diastolic (congestive) heart failure: Secondary | ICD-10-CM | POA: Diagnosis not present

## 2024-01-07 DIAGNOSIS — E785 Hyperlipidemia, unspecified: Secondary | ICD-10-CM | POA: Diagnosis not present

## 2024-01-07 DIAGNOSIS — Z8501 Personal history of malignant neoplasm of esophagus: Secondary | ICD-10-CM | POA: Diagnosis not present

## 2024-01-07 DIAGNOSIS — K3184 Gastroparesis: Secondary | ICD-10-CM | POA: Diagnosis not present

## 2024-01-07 DIAGNOSIS — M47812 Spondylosis without myelopathy or radiculopathy, cervical region: Secondary | ICD-10-CM | POA: Diagnosis not present

## 2024-01-07 DIAGNOSIS — M25532 Pain in left wrist: Secondary | ICD-10-CM | POA: Diagnosis not present

## 2024-01-07 DIAGNOSIS — M25562 Pain in left knee: Secondary | ICD-10-CM | POA: Diagnosis not present

## 2024-01-08 DIAGNOSIS — J301 Allergic rhinitis due to pollen: Secondary | ICD-10-CM | POA: Diagnosis not present

## 2024-01-13 DIAGNOSIS — S7002XD Contusion of left hip, subsequent encounter: Secondary | ICD-10-CM | POA: Diagnosis not present

## 2024-01-13 DIAGNOSIS — I739 Peripheral vascular disease, unspecified: Secondary | ICD-10-CM | POA: Diagnosis not present

## 2024-01-13 DIAGNOSIS — I252 Old myocardial infarction: Secondary | ICD-10-CM | POA: Diagnosis not present

## 2024-01-13 DIAGNOSIS — K3184 Gastroparesis: Secondary | ICD-10-CM | POA: Diagnosis not present

## 2024-01-13 DIAGNOSIS — M25562 Pain in left knee: Secondary | ICD-10-CM | POA: Diagnosis not present

## 2024-01-13 DIAGNOSIS — N1831 Chronic kidney disease, stage 3a: Secondary | ICD-10-CM | POA: Diagnosis not present

## 2024-01-13 DIAGNOSIS — J449 Chronic obstructive pulmonary disease, unspecified: Secondary | ICD-10-CM | POA: Diagnosis not present

## 2024-01-13 DIAGNOSIS — M25532 Pain in left wrist: Secondary | ICD-10-CM | POA: Diagnosis not present

## 2024-01-13 DIAGNOSIS — I2510 Atherosclerotic heart disease of native coronary artery without angina pectoris: Secondary | ICD-10-CM | POA: Diagnosis not present

## 2024-01-13 DIAGNOSIS — M47812 Spondylosis without myelopathy or radiculopathy, cervical region: Secondary | ICD-10-CM | POA: Diagnosis not present

## 2024-01-13 DIAGNOSIS — E663 Overweight: Secondary | ICD-10-CM | POA: Diagnosis not present

## 2024-01-13 DIAGNOSIS — M19012 Primary osteoarthritis, left shoulder: Secondary | ICD-10-CM | POA: Diagnosis not present

## 2024-01-13 DIAGNOSIS — Z7902 Long term (current) use of antithrombotics/antiplatelets: Secondary | ICD-10-CM | POA: Diagnosis not present

## 2024-01-13 DIAGNOSIS — E785 Hyperlipidemia, unspecified: Secondary | ICD-10-CM | POA: Diagnosis not present

## 2024-01-13 DIAGNOSIS — I130 Hypertensive heart and chronic kidney disease with heart failure and stage 1 through stage 4 chronic kidney disease, or unspecified chronic kidney disease: Secondary | ICD-10-CM | POA: Diagnosis not present

## 2024-01-13 DIAGNOSIS — K219 Gastro-esophageal reflux disease without esophagitis: Secondary | ICD-10-CM | POA: Diagnosis not present

## 2024-01-13 DIAGNOSIS — F32A Depression, unspecified: Secondary | ICD-10-CM | POA: Diagnosis not present

## 2024-01-13 DIAGNOSIS — I5032 Chronic diastolic (congestive) heart failure: Secondary | ICD-10-CM | POA: Diagnosis not present

## 2024-01-13 DIAGNOSIS — F419 Anxiety disorder, unspecified: Secondary | ICD-10-CM | POA: Diagnosis not present

## 2024-01-13 DIAGNOSIS — Z8501 Personal history of malignant neoplasm of esophagus: Secondary | ICD-10-CM | POA: Diagnosis not present

## 2024-01-13 DIAGNOSIS — Z7982 Long term (current) use of aspirin: Secondary | ICD-10-CM | POA: Diagnosis not present

## 2024-01-13 DIAGNOSIS — Z87891 Personal history of nicotine dependence: Secondary | ICD-10-CM | POA: Diagnosis not present

## 2024-01-13 DIAGNOSIS — Z9181 History of falling: Secondary | ICD-10-CM | POA: Diagnosis not present

## 2024-01-13 DIAGNOSIS — M25542 Pain in joints of left hand: Secondary | ICD-10-CM | POA: Diagnosis not present

## 2024-01-14 DIAGNOSIS — J301 Allergic rhinitis due to pollen: Secondary | ICD-10-CM | POA: Diagnosis not present

## 2024-01-18 ENCOUNTER — Encounter: Payer: Self-pay | Admitting: Cardiovascular Disease

## 2024-01-18 ENCOUNTER — Telehealth: Payer: Self-pay

## 2024-01-18 NOTE — Telephone Encounter (Signed)
 Copied from CRM [id number]. Topic: Appointments - Scheduling Inquiry for Clinic >> Jan 18, 2024  9:19 AM [name] wrote: Reason for CRM: Patient realized she has a 2 PM appt with the ENT which she cannot reschedule but also really needs this appt with NP Vincente and would like to see if there is any possibility for her to come in any other time tomorrow by chance? I do not see anything here for her for tomorrow and next appt is 10/9. This is a hospital follow up.  Patient callback is [phone] (please leave a message if she does not answer)  I spoke with patient and let her know that a 1:20pm slot has opened up on Charanpreet Kaur, NP's schedule for tomorrow.  Patient states she would like to move to 1:20pm, so I changed her appointment time.

## 2024-01-19 ENCOUNTER — Ambulatory Visit: Admitting: Nurse Practitioner

## 2024-01-19 ENCOUNTER — Encounter: Payer: Self-pay | Admitting: Nurse Practitioner

## 2024-01-19 VITALS — BP 130/82 | HR 98 | Ht 63.0 in | Wt 163.4 lb

## 2024-01-19 DIAGNOSIS — J309 Allergic rhinitis, unspecified: Secondary | ICD-10-CM | POA: Diagnosis not present

## 2024-01-19 DIAGNOSIS — F419 Anxiety disorder, unspecified: Secondary | ICD-10-CM

## 2024-01-19 DIAGNOSIS — M25552 Pain in left hip: Secondary | ICD-10-CM

## 2024-01-19 DIAGNOSIS — K649 Unspecified hemorrhoids: Secondary | ICD-10-CM
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
## 2024-01-22 ENCOUNTER — Encounter: Payer: Self-pay | Admitting: Nurse Practitioner

## 2024-01-22 ENCOUNTER — Other Ambulatory Visit: Payer: Self-pay | Admitting: Nurse Practitioner

## 2024-01-22 DIAGNOSIS — Z1231 Encounter for screening mammogram for malignant neoplasm of breast: Secondary | ICD-10-CM

## 2024-01-22 MED ORDER — FUROSEMIDE 20 MG PO TABS: 20.0000 mg | ORAL_TABLET | Freq: Every day | ORAL | 3 refills | Status: AC

## 2024-01-25 ENCOUNTER — Other Ambulatory Visit: Payer: Self-pay | Admitting: Nurse Practitioner

## 2024-01-25 DIAGNOSIS — J432 Centrilobular emphysema: Secondary | ICD-10-CM

## 2024-01-25 DIAGNOSIS — U099 Post covid-19 condition, unspecified: Secondary | ICD-10-CM

## 2024-01-26 DIAGNOSIS — K3184 Gastroparesis: Secondary | ICD-10-CM | POA: Diagnosis not present

## 2024-01-26 DIAGNOSIS — M25542 Pain in joints of left hand: Secondary | ICD-10-CM | POA: Diagnosis not present

## 2024-01-26 DIAGNOSIS — Z7982 Long term (current) use of aspirin: Secondary | ICD-10-CM | POA: Diagnosis not present

## 2024-01-26 DIAGNOSIS — I5032 Chronic diastolic (congestive) heart failure: Secondary | ICD-10-CM | POA: Diagnosis not present

## 2024-01-26 DIAGNOSIS — F419 Anxiety disorder, unspecified: Secondary | ICD-10-CM | POA: Diagnosis not present

## 2024-01-26 DIAGNOSIS — I2510 Atherosclerotic heart disease of native coronary artery without angina pectoris: Secondary | ICD-10-CM | POA: Diagnosis not present

## 2024-01-26 DIAGNOSIS — M47812 Spondylosis without myelopathy or radiculopathy, cervical region: Secondary | ICD-10-CM | POA: Diagnosis not present

## 2024-01-26 DIAGNOSIS — Z9181 History of falling: Secondary | ICD-10-CM | POA: Diagnosis not present

## 2024-01-26 DIAGNOSIS — M25532 Pain in left wrist: Secondary | ICD-10-CM | POA: Diagnosis not present

## 2024-01-26 DIAGNOSIS — M25562 Pain in left knee: Secondary | ICD-10-CM | POA: Diagnosis not present

## 2024-01-26 DIAGNOSIS — M19012 Primary osteoarthritis, left shoulder: Secondary | ICD-10-CM | POA: Diagnosis not present

## 2024-01-26 DIAGNOSIS — J449 Chronic obstructive pulmonary disease, unspecified: Secondary | ICD-10-CM | POA: Diagnosis not present

## 2024-01-26 DIAGNOSIS — E785 Hyperlipidemia, unspecified: Secondary | ICD-10-CM | POA: Diagnosis not present

## 2024-01-26 DIAGNOSIS — Z87891 Personal history of nicotine dependence: Secondary | ICD-10-CM | POA: Diagnosis not present

## 2024-01-26 DIAGNOSIS — F32A Depression, unspecified: Secondary | ICD-10-CM | POA: Diagnosis not present

## 2024-01-26 DIAGNOSIS — I69354 Hemiplegia and hemiparesis following cerebral infarction affecting left non-dominant side: Secondary | ICD-10-CM | POA: Diagnosis not present

## 2024-01-26 DIAGNOSIS — I130 Hypertensive heart and chronic kidney disease with heart failure and stage 1 through stage 4 chronic kidney disease, or unspecified chronic kidney disease: Secondary | ICD-10-CM | POA: Diagnosis not present

## 2024-01-26 DIAGNOSIS — K219 Gastro-esophageal reflux disease without esophagitis: Secondary | ICD-10-CM | POA: Diagnosis not present

## 2024-01-26 DIAGNOSIS — E663 Overweight: Secondary | ICD-10-CM | POA: Diagnosis not present

## 2024-01-26 DIAGNOSIS — N1831 Chronic kidney disease, stage 3a: Secondary | ICD-10-CM | POA: Diagnosis not present

## 2024-01-26 DIAGNOSIS — I252 Old myocardial infarction: Secondary | ICD-10-CM | POA: Diagnosis not present

## 2024-01-26 DIAGNOSIS — S7002XD Contusion of left hip, subsequent encounter: Secondary | ICD-10-CM | POA: Diagnosis not present

## 2024-01-26 DIAGNOSIS — Z8501 Personal history of malignant neoplasm of esophagus: Secondary | ICD-10-CM | POA: Diagnosis not present

## 2024-01-26 DIAGNOSIS — I739 Peripheral vascular disease, unspecified: Secondary | ICD-10-CM | POA: Diagnosis not present

## 2024-01-26 DIAGNOSIS — Z7902 Long term (current) use of antithrombotics/antiplatelets: Secondary | ICD-10-CM | POA: Diagnosis not present

## 2024-01-31 ENCOUNTER — Encounter: Payer: Self-pay | Admitting: Nurse Practitioner

## 2024-01-31 DIAGNOSIS — K649 Unspecified hemorrhoids: Secondary | ICD-10-CM | POA: Insufficient documentation

## 2024-01-31 MED ORDER — BUPROPION HCL ER (XL) 300 MG PO TB24: 300.0000 mg | ORAL_TABLET | Freq: Every day | ORAL | 3 refills | Status: AC

## 2024-01-31 MED ORDER — PANTOPRAZOLE SODIUM 40 MG PO TBEC: DELAYED_RELEASE_TABLET | ORAL | 3 refills | Status: AC

## 2024-01-31 NOTE — Assessment & Plan Note (Signed)
 Persistent pain with 70% improvement since onset. Physical therapy ongoing with gradual improvement. - Continue physical therapy once a week. - Use a walker for stability. - Use a donut pillow for sitting.

## 2024-01-31 NOTE — Assessment & Plan Note (Signed)
 Chronic hemorrhoids with recent exacerbation. - Send referral to [facility] for hemorrhoid evaluation.

## 2024-02-01 ENCOUNTER — Other Ambulatory Visit: Payer: Self-pay | Admitting: Medical

## 2024-02-02 ENCOUNTER — Other Ambulatory Visit: Payer: Self-pay | Admitting: Physician Assistant

## 2024-02-03 ENCOUNTER — Other Ambulatory Visit: Payer: Self-pay

## 2024-02-03 ENCOUNTER — Encounter: Admitting: Internal Medicine

## 2024-02-03 ENCOUNTER — Telehealth: Payer: Self-pay | Admitting: Pharmacy Technician

## 2024-02-03 MED ORDER — LOSARTAN POTASSIUM 100 MG PO TABS: 100.0000 mg | ORAL_TABLET | Freq: Every day | ORAL | 5 refills | Status: AC

## 2024-02-03 NOTE — Telephone Encounter (Signed)
    Hi, we received a refill request for losartan  from their pharmacy if someone can send that in please and thank you

## 2024-02-04 DIAGNOSIS — F32A Depression, unspecified: Secondary | ICD-10-CM | POA: Diagnosis not present

## 2024-02-04 DIAGNOSIS — K3184 Gastroparesis: Secondary | ICD-10-CM | POA: Diagnosis not present

## 2024-02-04 DIAGNOSIS — Z87891 Personal history of nicotine dependence: Secondary | ICD-10-CM | POA: Diagnosis not present

## 2024-02-04 DIAGNOSIS — J449 Chronic obstructive pulmonary disease, unspecified: Secondary | ICD-10-CM | POA: Diagnosis not present

## 2024-02-04 DIAGNOSIS — M19012 Primary osteoarthritis, left shoulder: Secondary | ICD-10-CM | POA: Diagnosis not present

## 2024-02-04 DIAGNOSIS — I5032 Chronic diastolic (congestive) heart failure: Secondary | ICD-10-CM | POA: Diagnosis not present

## 2024-02-04 DIAGNOSIS — M47812 Spondylosis without myelopathy or radiculopathy, cervical region: Secondary | ICD-10-CM | POA: Diagnosis not present

## 2024-02-04 DIAGNOSIS — E663 Overweight: Secondary | ICD-10-CM | POA: Diagnosis not present

## 2024-02-04 DIAGNOSIS — M25532 Pain in left wrist: Secondary | ICD-10-CM | POA: Diagnosis not present

## 2024-02-04 DIAGNOSIS — I69354 Hemiplegia and hemiparesis following cerebral infarction affecting left non-dominant side: Secondary | ICD-10-CM | POA: Diagnosis not present

## 2024-02-04 DIAGNOSIS — E785 Hyperlipidemia, unspecified: Secondary | ICD-10-CM | POA: Diagnosis not present

## 2024-02-04 DIAGNOSIS — Z7982 Long term (current) use of aspirin: Secondary | ICD-10-CM | POA: Diagnosis not present

## 2024-02-04 DIAGNOSIS — M25542 Pain in joints of left hand: Secondary | ICD-10-CM | POA: Diagnosis not present

## 2024-02-04 DIAGNOSIS — Z8501 Personal history of malignant neoplasm of esophagus: Secondary | ICD-10-CM | POA: Diagnosis not present

## 2024-02-04 DIAGNOSIS — I252 Old myocardial infarction: Secondary | ICD-10-CM | POA: Diagnosis not present

## 2024-02-04 DIAGNOSIS — I130 Hypertensive heart and chronic kidney disease with heart failure and stage 1 through stage 4 chronic kidney disease, or unspecified chronic kidney disease: Secondary | ICD-10-CM | POA: Diagnosis not present

## 2024-02-04 DIAGNOSIS — M25562 Pain in left knee: Secondary | ICD-10-CM | POA: Diagnosis not present

## 2024-02-04 DIAGNOSIS — K219 Gastro-esophageal reflux disease without esophagitis: Secondary | ICD-10-CM | POA: Diagnosis not present

## 2024-02-04 DIAGNOSIS — I739 Peripheral vascular disease, unspecified: Secondary | ICD-10-CM | POA: Diagnosis not present

## 2024-02-04 DIAGNOSIS — Z9181 History of falling: Secondary | ICD-10-CM | POA: Diagnosis not present

## 2024-02-04 DIAGNOSIS — S7002XD Contusion of left hip, subsequent encounter: Secondary | ICD-10-CM | POA: Diagnosis not present

## 2024-02-04 DIAGNOSIS — I2510 Atherosclerotic heart disease of native coronary artery without angina pectoris: Secondary | ICD-10-CM | POA: Diagnosis not present

## 2024-02-04 DIAGNOSIS — N1831 Chronic kidney disease, stage 3a: Secondary | ICD-10-CM | POA: Diagnosis not present

## 2024-02-04 DIAGNOSIS — Z7902 Long term (current) use of antithrombotics/antiplatelets: Secondary | ICD-10-CM | POA: Diagnosis not present

## 2024-02-04 DIAGNOSIS — F419 Anxiety disorder, unspecified: Secondary | ICD-10-CM | POA: Diagnosis not present

## 2024-02-05 ENCOUNTER — Ambulatory Visit: Admitting: Medical

## 2024-02-06 ENCOUNTER — Other Ambulatory Visit: Payer: Self-pay | Admitting: Nurse Practitioner

## 2024-02-07 ENCOUNTER — Other Ambulatory Visit: Payer: Self-pay | Admitting: Nurse Practitioner

## 2024-02-11 DIAGNOSIS — M25562 Pain in left knee: Secondary | ICD-10-CM | POA: Diagnosis not present

## 2024-02-11 DIAGNOSIS — F32A Depression, unspecified: Secondary | ICD-10-CM | POA: Diagnosis not present

## 2024-02-11 DIAGNOSIS — E663 Overweight: Secondary | ICD-10-CM | POA: Diagnosis not present

## 2024-02-11 DIAGNOSIS — Z8501 Personal history of malignant neoplasm of esophagus: Secondary | ICD-10-CM | POA: Diagnosis not present

## 2024-02-11 DIAGNOSIS — I252 Old myocardial infarction: Secondary | ICD-10-CM | POA: Diagnosis not present

## 2024-02-11 DIAGNOSIS — Z7982 Long term (current) use of aspirin: Secondary | ICD-10-CM | POA: Diagnosis not present

## 2024-02-11 DIAGNOSIS — M19012 Primary osteoarthritis, left shoulder: Secondary | ICD-10-CM | POA: Diagnosis not present

## 2024-02-11 DIAGNOSIS — F419 Anxiety disorder, unspecified: Secondary | ICD-10-CM | POA: Diagnosis not present

## 2024-02-11 DIAGNOSIS — M25542 Pain in joints of left hand: Secondary | ICD-10-CM | POA: Diagnosis not present

## 2024-02-11 DIAGNOSIS — E785 Hyperlipidemia, unspecified: Secondary | ICD-10-CM | POA: Diagnosis not present

## 2024-02-11 DIAGNOSIS — J449 Chronic obstructive pulmonary disease, unspecified: Secondary | ICD-10-CM | POA: Diagnosis not present

## 2024-02-11 DIAGNOSIS — I69354 Hemiplegia and hemiparesis following cerebral infarction affecting left non-dominant side: Secondary | ICD-10-CM | POA: Diagnosis not present

## 2024-02-11 DIAGNOSIS — K219 Gastro-esophageal reflux disease without esophagitis: Secondary | ICD-10-CM | POA: Diagnosis not present

## 2024-02-11 DIAGNOSIS — I5032 Chronic diastolic (congestive) heart failure: Secondary | ICD-10-CM | POA: Diagnosis not present

## 2024-02-11 DIAGNOSIS — I739 Peripheral vascular disease, unspecified: Secondary | ICD-10-CM | POA: Diagnosis not present

## 2024-02-11 DIAGNOSIS — K3184 Gastroparesis: Secondary | ICD-10-CM | POA: Diagnosis not present

## 2024-02-11 DIAGNOSIS — Z87891 Personal history of nicotine dependence: Secondary | ICD-10-CM | POA: Diagnosis not present

## 2024-02-11 DIAGNOSIS — I130 Hypertensive heart and chronic kidney disease with heart failure and stage 1 through stage 4 chronic kidney disease, or unspecified chronic kidney disease: Secondary | ICD-10-CM | POA: Diagnosis not present

## 2024-02-11 DIAGNOSIS — M47812 Spondylosis without myelopathy or radiculopathy, cervical region: Secondary | ICD-10-CM | POA: Diagnosis not present

## 2024-02-11 DIAGNOSIS — Z9181 History of falling: Secondary | ICD-10-CM | POA: Diagnosis not present

## 2024-02-11 DIAGNOSIS — M25532 Pain in left wrist: Secondary | ICD-10-CM | POA: Diagnosis not present

## 2024-02-11 DIAGNOSIS — Z7902 Long term (current) use of antithrombotics/antiplatelets: Secondary | ICD-10-CM | POA: Diagnosis not present

## 2024-02-11 DIAGNOSIS — N1831 Chronic kidney disease, stage 3a: Secondary | ICD-10-CM | POA: Diagnosis not present

## 2024-02-11 DIAGNOSIS — I2510 Atherosclerotic heart disease of native coronary artery without angina pectoris: Secondary | ICD-10-CM | POA: Diagnosis not present

## 2024-02-11 DIAGNOSIS — S7002XD Contusion of left hip, subsequent encounter: Secondary | ICD-10-CM | POA: Diagnosis not present

## 2024-02-16 ENCOUNTER — Ambulatory Visit: Admitting: Internal Medicine

## 2024-02-16 ENCOUNTER — Ambulatory Visit: Admitting: Physician Assistant
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
## 2024-02-26 ENCOUNTER — Ambulatory Visit: Admitting: Nurse Practitioner

## 2024-02-26 ENCOUNTER — Other Ambulatory Visit (HOSPITAL_COMMUNITY)
Admission: RE | Admit: 2024-02-26 | Discharge: 2024-02-26 | Disposition: A | Discharge status: Home / Self Care | Source: Ambulatory Visit | Attending: Nurse Practitioner | Admitting: Nurse Practitioner

## 2024-02-26 ENCOUNTER — Encounter: Payer: Self-pay | Admitting: Nurse Practitioner

## 2024-02-26 ENCOUNTER — Encounter: Payer: Self-pay | Admitting: Physician Assistant

## 2024-02-26 ENCOUNTER — Ambulatory Visit: Attending: Physician Assistant | Admitting: Physician Assistant

## 2024-02-26 VITALS — BP 140/80 | HR 70 | Ht 63.0 in | Wt 162.8 lb

## 2024-02-26 VITALS — BP 130/80 | HR 80 | Temp 97.4°F | Ht 63.0 in | Wt 160.8 lb

## 2024-02-26 DIAGNOSIS — F32A Depression, unspecified: Secondary | ICD-10-CM

## 2024-02-26 DIAGNOSIS — Z0000 Encounter for general adult medical examination without abnormal findings: Secondary | ICD-10-CM | POA: Diagnosis not present

## 2024-02-26 DIAGNOSIS — I471 Supraventricular tachycardia, unspecified: Secondary | ICD-10-CM

## 2024-02-26 DIAGNOSIS — I2510 Atherosclerotic heart disease of native coronary artery without angina pectoris: Secondary | ICD-10-CM

## 2024-02-26 DIAGNOSIS — E785 Hyperlipidemia, unspecified: Secondary | ICD-10-CM | POA: Diagnosis not present

## 2024-02-26 DIAGNOSIS — R002 Palpitations: Secondary | ICD-10-CM | POA: Diagnosis not present

## 2024-02-26 DIAGNOSIS — I10 Essential (primary) hypertension: Secondary | ICD-10-CM

## 2024-02-26 DIAGNOSIS — R0609 Other forms of dyspnea: Secondary | ICD-10-CM | POA: Diagnosis not present

## 2024-02-26 DIAGNOSIS — Z23 Encounter for immunization: Secondary | ICD-10-CM

## 2024-02-26 DIAGNOSIS — G4733 Obstructive sleep apnea (adult) (pediatric): Secondary | ICD-10-CM | POA: Diagnosis not present

## 2024-02-26 DIAGNOSIS — Z780 Asymptomatic menopausal state: Secondary | ICD-10-CM | POA: Insufficient documentation

## 2024-02-26 DIAGNOSIS — I5032 Chronic diastolic (congestive) heart failure: Secondary | ICD-10-CM | POA: Diagnosis not present

## 2024-02-26 DIAGNOSIS — N289 Disorder of kidney and ureter, unspecified: Secondary | ICD-10-CM

## 2024-02-26 DIAGNOSIS — J449 Chronic obstructive pulmonary disease, unspecified: Secondary | ICD-10-CM
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
## 2024-02-26 NOTE — Assessment & Plan Note (Deleted)
 SABRA

## 2024-02-26 NOTE — Assessment & Plan Note (Deleted)
 [name]

## 2024-02-26 NOTE — Patient Instructions (Signed)
 Medication Instructions:   Your physician recommends that you continue on your current medications as directed. Please refer to the Current Medication list given to you today.    *If you need a refill on your cardiac medications before your next appointment, please call your pharmacy*  Lab Work:  None ordered at this time   If you have labs (blood work) drawn today and your tests are completely normal, you will receive your results only by:  MyChart Message (if you have MyChart) OR  A paper copy in the mail If you have any lab test that is abnormal or we need to change your treatment, we will call you to review the results.  Testing/Procedures:  None ordered at this time   Referrals:  None ordered at this time   Follow-Up:  At [facility], you and your health needs are our priority.  As part of our continuing mission to provide you with exceptional heart care, our providers are all part of one team.  This team includes your primary Cardiologist (physician) and Advanced Practice Providers or APPs (Physician Assistants and Nurse Practitioners) who all work together to provide you with the care you need, when you need it.  Your next appointment:   5 - 6 month(s)  Provider:    You may see Deatrice Cage, MD or one of the following Advanced Practice Providers on your designated Care Team:   Lonni Meager, NP Lesley Maffucci, PA-C Bernardino Bring, PA-C Cadence [place], PA-C Tylene Lunch, NP Barnie Hila, NP    We recommend signing up for the patient portal called MyChart.  Sign up information is provided on this After Visit Summary.  MyChart is used to connect with patients for Virtual Visits (Telemedicine).  Patients are able to view lab/test results, encounter notes, upcoming appointments, etc.  Non-urgent messages can be sent to your provider as well.   To learn more about what you can do with MyChart, go to [email or web address].

## 2024-02-27 LAB — CBC WITH DIFFERENTIAL/PLATELET
Basophils Absolute: 0.1 x10E3/uL (ref 0.0–0.2)
Basos: 1 %
EOS (ABSOLUTE): 0.3 x10E3/uL (ref 0.0–0.4)
Eos: 3 %
Hematocrit: 43.7 % (ref 34.0–46.6)
Hemoglobin: 14.4 g/dL (ref 11.1–15.9)
Immature Grans (Abs): 0 x10E3/uL (ref 0.0–0.1)
Immature Granulocytes: 0 %
Lymphocytes Absolute: 2.4 x10E3/uL (ref 0.7–3.1)
Lymphs: 24 %
MCH: 31 pg (ref 26.6–33.0)
MCHC: 33 g/dL (ref 31.5–35.7)
MCV: 94 fL (ref 79–97)
Monocytes Absolute: 0.6 x10E3/uL (ref 0.1–0.9)
Monocytes: 6 %
Neutrophils Absolute: 6.4 x10E3/uL (ref 1.4–7.0)
Neutrophils: 66 %
Platelets: 330 x10E3/uL (ref 150–450)
RBC: 4.64 x10E6/uL (ref 3.77–5.28)
RDW: 12 % (ref 11.7–15.4)
WBC: 9.9 x10E3/uL (ref 3.4–10.8)

## 2024-02-27 LAB — LIPID PANEL
Chol/HDL Ratio: 2.6 ratio (ref 0.0–4.4)
Cholesterol, Total: 161 mg/dL (ref 100–199)
HDL: 63 mg/dL (ref >39)
LDL Chol Calc (NIH): 80 mg/dL (ref 0–99)
Triglycerides: 99 mg/dL (ref 0–149)
VLDL Cholesterol Cal: 18 mg/dL (ref 5–40)

## 2024-02-27 LAB — COMPREHENSIVE METABOLIC PANEL WITH GFR
ALT: 13 IU/L (ref 0–32)
AST: 13 IU/L (ref 0–40)
Albumin: 4.3 g/dL (ref 3.9–4.9)
Alkaline Phosphatase: 96 IU/L (ref 49–135)
BUN/Creatinine Ratio: 18 (ref 12–28)
BUN: 17 mg/dL (ref 8–27)
Bilirubin Total: 0.4 mg/dL (ref 0.0–1.2)
CO2: 24 mmol/L (ref 20–29)
Calcium: 9.8 mg/dL (ref 8.7–10.3)
Chloride: 105 mmol/L (ref 96–106)
Creatinine, Ser: 0.97 mg/dL (ref 0.57–1.00)
Globulin, Total: 2.4 g/dL (ref 1.5–4.5)
Glucose: 79 mg/dL (ref 70–99)
Potassium: 4.4 mmol/L (ref 3.5–5.2)
Sodium: 142 mmol/L (ref 134–144)
Total Protein: 6.7 g/dL (ref 6.0–8.5)
eGFR: 66 mL/min/1.73 (ref >59)

## 2024-02-27 LAB — TSH: TSH: 4.02 u[IU]/mL (ref 0.450–4.500)

## 2024-02-29 ENCOUNTER — Ambulatory Visit: Payer: Self-pay | Admitting: Nurse Practitioner

## 2024-03-01 LAB — CYTOLOGY - PAP
Chlamydia: NEGATIVE
Comment: NEGATIVE
Comment: NEGATIVE
Comment: NEGATIVE
Comment: NORMAL
Diagnosis: NEGATIVE
High risk HPV: NEGATIVE
Neisseria Gonorrhea: NEGATIVE
Trichomonas: NEGATIVE

## 2024-03-02 NOTE — Assessment & Plan Note (Addendum)
 Physical exam completed. Flu and tetanus provided. Coloscopy up-to-date, Pap performed patient tolerated.  Mammogram appointment scheduled. -Encouraged patient to consume a balanced diet and regular exercise regimen. -Advised to see an ophthalmology and dentist annually.  -Will checks labs as outlined.

## 2024-03-03 ENCOUNTER — Other Ambulatory Visit: Payer: Self-pay | Admitting: Nurse Practitioner

## 2024-03-13 ENCOUNTER — Other Ambulatory Visit: Payer: Self-pay | Admitting: Nurse Practitioner

## 2024-03-23 ENCOUNTER — Telehealth: Payer: Self-pay | Admitting: Internal Medicine

## 2024-03-23 NOTE — Telephone Encounter (Signed)
 Left vm and sent mychart message to confirm 03/30/24 appointment-Toni

## 2024-03-30 ENCOUNTER — Encounter: Admitting: Internal Medicine

## 2024-03-30 ENCOUNTER — Encounter: Payer: Self-pay | Admitting: Internal Medicine

## 2024-03-31 DIAGNOSIS — J301 Allergic rhinitis due to pollen: Secondary | ICD-10-CM | POA: Diagnosis not present

## 2024-04-05 ENCOUNTER — Ambulatory Visit: Admitting: Internal Medicine

## 2024-04-06 ENCOUNTER — Inpatient Hospital Stay: Admission: RE | Admit: 2024-04-06 | Discharge: 2024-04-06 | Attending: Nurse Practitioner

## 2024-04-06 DIAGNOSIS — Z1231 Encounter for screening mammogram for malignant neoplasm of breast: Secondary | ICD-10-CM | POA: Insufficient documentation

## 2024-04-06 DIAGNOSIS — J301 Allergic rhinitis due to pollen: Secondary | ICD-10-CM | POA: Diagnosis not present

## 2024-04-10 ENCOUNTER — Other Ambulatory Visit: Payer: Self-pay | Admitting: Nurse Practitioner

## 2024-04-10 DIAGNOSIS — F419 Anxiety disorder, unspecified: Secondary | ICD-10-CM

## 2024-04-11 NOTE — Telephone Encounter (Signed)
 Spoke with pt and she stated that she does not have enough to get her through till Jasmine Aurora, NP returns on January 15th.

## 2024-04-11 NOTE — Telephone Encounter (Signed)
 1. Anxiety and depression (Primary) PDMP reviewed, last refill for Diazepam  5 mg (60 tablets) was done on 03/13/24. Refill appropriate and sent. Please send future refill request to pcp. - diazepam  (VALIUM ) 5 MG tablet; TAKE 1 TABLET BY MOUTH EVERY 12 HOURS AS NEEDED FOR ANXIETY  Dispense: 60 tablet; Refill: 0  Luke Shade, MD (covering for NP, Vincente)

## 2024-04-22 ENCOUNTER — Other Ambulatory Visit: Payer: Self-pay | Admitting: Medical

## 2024-04-28 ENCOUNTER — Telehealth: Payer: Self-pay

## 2024-04-28 ENCOUNTER — Other Ambulatory Visit: Payer: Self-pay | Admitting: Nurse Practitioner

## 2024-04-28 NOTE — Telephone Encounter (Signed)
 Lmom to pt need appt PFT and DSK  for further refills and we sent 30 days with no refills

## 2024-05-23 ENCOUNTER — Encounter: Payer: Self-pay | Admitting: Nurse Practitioner

## 2024-08-25 ENCOUNTER — Ambulatory Visit: Admitting: Student
